# Patient Record
Sex: Female | Born: 1980 | Race: Black or African American | Hispanic: No | Marital: Single | Smoking: Never smoker
Health system: Southern US, Community
[De-identification: ages and names within clinical notes are randomized; demographics above are authoritative.]

## PROBLEM LIST (undated history)

## (undated) DIAGNOSIS — N912 Amenorrhea, unspecified: Secondary | ICD-10-CM

## (undated) DIAGNOSIS — E282 Polycystic ovarian syndrome: Secondary | ICD-10-CM

## (undated) DIAGNOSIS — K219 Gastro-esophageal reflux disease without esophagitis: Secondary | ICD-10-CM

## (undated) DIAGNOSIS — N2889 Other specified disorders of kidney and ureter: Secondary | ICD-10-CM

## (undated) DIAGNOSIS — I5020 Unspecified systolic (congestive) heart failure: Secondary | ICD-10-CM

## (undated) DIAGNOSIS — C649 Malignant neoplasm of unspecified kidney, except renal pelvis: Secondary | ICD-10-CM

## (undated) DIAGNOSIS — F32A Depression, unspecified: Secondary | ICD-10-CM

## (undated) DIAGNOSIS — I959 Hypotension, unspecified: Secondary | ICD-10-CM

## (undated) DIAGNOSIS — Z9989 Dependence on other enabling machines and devices: Secondary | ICD-10-CM

## (undated) DIAGNOSIS — R079 Chest pain, unspecified: Secondary | ICD-10-CM

## (undated) DIAGNOSIS — N979 Female infertility, unspecified: Secondary | ICD-10-CM

## (undated) DIAGNOSIS — I10 Essential (primary) hypertension: Secondary | ICD-10-CM

## (undated) DIAGNOSIS — I2510 Atherosclerotic heart disease of native coronary artery without angina pectoris: Secondary | ICD-10-CM

## (undated) DIAGNOSIS — L680 Hirsutism: Secondary | ICD-10-CM

## (undated) DIAGNOSIS — R0683 Snoring: Secondary | ICD-10-CM

## (undated) DIAGNOSIS — M109 Gout, unspecified: Secondary | ICD-10-CM

## (undated) DIAGNOSIS — I509 Heart failure, unspecified: Secondary | ICD-10-CM

## (undated) DIAGNOSIS — R400 Somnolence: Secondary | ICD-10-CM

## (undated) DIAGNOSIS — F329 Major depressive disorder, single episode, unspecified: Secondary | ICD-10-CM

## (undated) DIAGNOSIS — G43909 Migraine, unspecified, not intractable, without status migrainosus: Secondary | ICD-10-CM

## (undated) DIAGNOSIS — I5042 Chronic combined systolic (congestive) and diastolic (congestive) heart failure: Secondary | ICD-10-CM

## (undated) DIAGNOSIS — K760 Fatty (change of) liver, not elsewhere classified: Secondary | ICD-10-CM

## (undated) DIAGNOSIS — R87619 Unspecified abnormal cytological findings in specimens from cervix uteri: Secondary | ICD-10-CM

## (undated) DIAGNOSIS — K5732 Diverticulitis of large intestine without perforation or abscess without bleeding: Secondary | ICD-10-CM

## (undated) DIAGNOSIS — A64 Unspecified sexually transmitted disease: Secondary | ICD-10-CM

## (undated) DIAGNOSIS — G4733 Obstructive sleep apnea (adult) (pediatric): Secondary | ICD-10-CM

## (undated) DIAGNOSIS — R160 Hepatomegaly, not elsewhere classified: Secondary | ICD-10-CM

## (undated) HISTORY — PX: OTHER SURGICAL HISTORY: SHX169

## (undated) HISTORY — DX: Amenorrhea, unspecified: N91.2

## (undated) HISTORY — DX: Chronic combined systolic (congestive) and diastolic (congestive) heart failure: I50.42

## (undated) HISTORY — DX: Diverticulitis of large intestine without perforation or abscess without bleeding: K57.32

## (undated) HISTORY — DX: Dependence on other enabling machines and devices: Z99.89

## (undated) HISTORY — DX: Chest pain, unspecified: R07.9

## (undated) HISTORY — DX: Snoring: R06.83

## (undated) HISTORY — DX: Unspecified sexually transmitted disease: A64

## (undated) HISTORY — DX: Fatty (change of) liver, not elsewhere classified: K76.0

## (undated) HISTORY — DX: Obstructive sleep apnea (adult) (pediatric): G47.33

## (undated) HISTORY — DX: Gastro-esophageal reflux disease without esophagitis: K21.9

## (undated) HISTORY — DX: Polycystic ovarian syndrome: E28.2

## (undated) HISTORY — DX: Major depressive disorder, single episode, unspecified: F32.9

## (undated) HISTORY — DX: Other specified disorders of kidney and ureter: N28.89

## (undated) HISTORY — DX: Depression, unspecified: F32.A

## (undated) HISTORY — DX: Malignant neoplasm of unspecified kidney, except renal pelvis: C64.9

## (undated) HISTORY — DX: Female infertility, unspecified: N97.9

## (undated) HISTORY — DX: Unspecified abnormal cytological findings in specimens from cervix uteri: R87.619

## (undated) HISTORY — DX: Hirsutism: L68.0

## (undated) HISTORY — DX: Hepatomegaly, not elsewhere classified: R16.0

## (undated) HISTORY — DX: Somnolence: R40.0

## (undated) HISTORY — DX: Essential (primary) hypertension: I10

## (undated) HISTORY — DX: Unspecified systolic (congestive) heart failure: I50.20

## (undated) HISTORY — DX: Gout, unspecified: M10.9

## (undated) HISTORY — DX: Hypotension, unspecified: I95.9

## (undated) HISTORY — DX: Migraine, unspecified, not intractable, without status migrainosus: G43.909

## (undated) HISTORY — DX: Morbid (severe) obesity due to excess calories: E66.01

---
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
## 2000-05-27 ENCOUNTER — Other Ambulatory Visit: Admission: RE | Admit: 2000-05-27 | Discharge: 2000-05-27 | Payer: Self-pay | Admitting: Obstetrics and Gynecology

## 2001-08-19 ENCOUNTER — Other Ambulatory Visit: Admission: RE | Admit: 2001-08-19 | Discharge: 2001-08-19 | Payer: Self-pay | Admitting: Obstetrics and Gynecology

## 2002-07-12 ENCOUNTER — Other Ambulatory Visit: Admission: RE | Admit: 2002-07-12 | Discharge: 2002-07-12 | Payer: Self-pay | Admitting: Obstetrics and Gynecology

## 2002-08-25 ENCOUNTER — Emergency Department (HOSPITAL_COMMUNITY): Admission: AD | Admit: 2002-08-25 | Discharge: 2002-08-25 | Payer: Self-pay | Admitting: Emergency Medicine

## 2002-08-25 ENCOUNTER — Encounter: Payer: Self-pay | Admitting: Emergency Medicine

## 2003-09-13 ENCOUNTER — Emergency Department (HOSPITAL_COMMUNITY): Admission: EM | Admit: 2003-09-13 | Discharge: 2003-09-14 | Payer: Self-pay | Admitting: Emergency Medicine

## 2004-03-17 ENCOUNTER — Other Ambulatory Visit: Admission: RE | Admit: 2004-03-17 | Discharge: 2004-03-17 | Payer: Self-pay | Admitting: Obstetrics and Gynecology

## 2004-09-30 ENCOUNTER — Emergency Department (HOSPITAL_COMMUNITY): Admission: EM | Admit: 2004-09-30 | Discharge: 2004-09-30 | Payer: Self-pay | Admitting: Emergency Medicine

## 2005-04-17 ENCOUNTER — Other Ambulatory Visit: Admission: RE | Admit: 2005-04-17 | Discharge: 2005-04-17 | Payer: Self-pay | Admitting: Obstetrics and Gynecology

## 2013-03-01 ENCOUNTER — Telehealth: Payer: Self-pay | Admitting: Interventional Cardiology

## 2013-03-01 DIAGNOSIS — I10 Essential (primary) hypertension: Secondary | ICD-10-CM

## 2013-03-01 NOTE — Telephone Encounter (Signed)
New Problem:  Pt states she would like to have some blood work done. Kidney function... Please call back and let pt know if she can have this checked

## 2013-03-01 NOTE — Telephone Encounter (Signed)
pt was to have bmet drawn with [facility] and would like labs drawn here at [address] [facility]. order put in and pt will come on 03/02/13 to have lab drawn

## 2013-03-02 ENCOUNTER — Ambulatory Visit (INDEPENDENT_AMBULATORY_CARE_PROVIDER_SITE_OTHER): Payer: BC Managed Care – PPO | Admitting: Interventional Cardiology

## 2013-03-02 DIAGNOSIS — I509 Heart failure, unspecified: Secondary | ICD-10-CM

## 2013-03-02 LAB — BASIC METABOLIC PANEL
BUN: 12 mg/dL (ref 6–23)
CO2: 30 mEq/L (ref 19–32)
Calcium: 8.8 mg/dL (ref 8.4–10.5)
Chloride: 102 mEq/L (ref 96–112)
Creatinine, Ser: 0.9 mg/dL (ref 0.4–1.2)
GFR: 94.57 mL/min (ref >60.00)
Glucose, Bld: 87 mg/dL (ref 70–99)
Potassium: 3.3 mEq/L — ABNORMAL LOW (ref 3.5–5.1)
Sodium: 136 mEq/L (ref 135–145)

## 2013-03-03 ENCOUNTER — Telehealth: Payer: Self-pay

## 2013-03-03 DIAGNOSIS — Z79899 Other long term (current) drug therapy: Secondary | ICD-10-CM

## 2013-03-03 MED ORDER — POTASSIUM CHLORIDE CRYS ER 20 MEQ PO TBCR: 20.0000 meq | EXTENDED_RELEASE_TABLET | Freq: Every day | ORAL | Status: DC

## 2013-03-03 NOTE — Telephone Encounter (Signed)
Advised pt start KDur 20 meq BID for 3 days then decrease to 20 meq daily. Advised pt we will recheck potassium level at her next o/v. Pt verbalized understanding.

## 2013-03-12 NOTE — Telephone Encounter (Signed)
Let her know we need BMET

## 2013-03-16 ENCOUNTER — Encounter: Payer: Self-pay | Admitting: *Deleted

## 2013-03-16 ENCOUNTER — Other Ambulatory Visit: Payer: Self-pay

## 2013-03-16 ENCOUNTER — Encounter: Payer: Self-pay | Admitting: Interventional Cardiology

## 2013-03-16 DIAGNOSIS — I509 Heart failure, unspecified: Secondary | ICD-10-CM

## 2013-03-20 ENCOUNTER — Other Ambulatory Visit: Payer: BC Managed Care – PPO | Admitting: Interventional Cardiology

## 2013-03-20 ENCOUNTER — Encounter: Payer: Self-pay | Admitting: Interventional Cardiology

## 2013-03-20 ENCOUNTER — Ambulatory Visit (INDEPENDENT_AMBULATORY_CARE_PROVIDER_SITE_OTHER): Payer: BC Managed Care – PPO | Admitting: Interventional Cardiology

## 2013-03-20 VITALS — BP 128/98 | HR 89 | Ht 66.0 in | Wt 230.0 lb

## 2013-03-20 DIAGNOSIS — I5021 Acute systolic (congestive) heart failure: Secondary | ICD-10-CM

## 2013-03-20 DIAGNOSIS — E282 Polycystic ovarian syndrome: Secondary | ICD-10-CM

## 2013-03-20 DIAGNOSIS — I10 Essential (primary) hypertension: Secondary | ICD-10-CM | POA: Insufficient documentation

## 2013-03-20 HISTORY — DX: Essential (primary) hypertension: I10

## 2013-03-20 HISTORY — DX: Polycystic ovarian syndrome: E28.2

## 2013-03-20 HISTORY — DX: Morbid (severe) obesity due to excess calories: E66.01

## 2013-03-20 LAB — BASIC METABOLIC PANEL
BUN: 12 mg/dL (ref 6–23)
CO2: 30 mEq/L (ref 19–32)
Calcium: 9.1 mg/dL (ref 8.4–10.5)
Chloride: 102 mEq/L (ref 96–112)
Creatinine, Ser: 0.8 mg/dL (ref 0.4–1.2)
GFR: 103.91 mL/min (ref >60.00)
Glucose, Bld: 93 mg/dL (ref 70–99)
Potassium: 4 mEq/L (ref 3.5–5.1)
Sodium: 139 mEq/L (ref 135–145)

## 2013-03-20 MED ORDER — CARVEDILOL 12.5 MG PO TABS: 18.7500 mg | ORAL_TABLET | Freq: Two times a day (BID) | ORAL | Status: DC

## 2013-03-20 NOTE — Progress Notes (Signed)
Patient ID: Renee Roberson, female   DOB: [birth year], 32 y.o.   MRN: [id number]    [address] [place], [place]  [address] Phone: [phone] Fax:  [phone]  Date:  03/20/2013   ID:  Renee Roberson, DOB 23-Nov-1980, MRN [id number]  PCP:  No primary provider on file.   ASSESSMENT:  1. Systolic heart failure, acute on chronic, now stable  2. Poorly controlled hypertension, accelerated, now markedly improved  3. Obesity  4. Polycystic ovary syndrome  PLAN:  1. Increase carvedilol to 18.75 mg twice daily  2. Salt and fluid restriction  3. Aerobic exercise and weight loss   SUBJECTIVE: Renee Roberson is a 32 y.o. female with no specific complaints. She is adjusted to the medication regimen that we are using for her blood pressure and acute systolic heart failure. Orthostatic dizziness is improved. She denies orthopnea, PND, chest discomfort, palpitations, and syncope.   Wt Readings from Last 3 Encounters:  03/20/13 230 lb (104.327 kg)     Past Medical History  Diagnosis Date  . GERD (gastroesophageal reflux disease)   . Migraine   . HTN (hypertension)   . Gout   . Systolic heart failure     Current Outpatient Prescriptions  Medication Sig Dispense Refill  . amLODipine-benazepril (LOTREL) 5-40 MG per capsule Take 1 capsule by mouth daily.      . carvedilol (COREG) 12.5 MG tablet Take 12.5 mg by mouth 2 (two) times daily with a meal.      . furosemide (LASIX) 40 MG tablet Take 40 mg by mouth.      . potassium chloride SA (K-DUR,KLOR-CON) 20 MEQ tablet Take 1 tablet (20 mEq total) by mouth daily.  30 tablet  11   No current facility-administered medications for this visit.    Allergies:   No Known Allergies  Social History:  The patient  reports that she has never smoked. She does not have any smokeless tobacco history on file. She reports that she does not drink alcohol or use illicit drugs.   ROS:  Please see the history of present  illness.    All other systems reviewed and negative.   OBJECTIVE: VS:  BP 128/98  Pulse 89  Ht 5\' 6"  (1.676 m)  Wt 230 lb (104.327 kg)  BMI 37.14 kg/m2 Well nourished, well developed, in no acute distress, moderate to severely obese HEENT: normal Neck: JVD 9. Carotid bruit none  Cardiac:  normal S1, S2; RRR; no murmur. An audible S4 gallop is present  Lungs:  clear to auscultation bilaterally, no wheezing, rhonchi or rales Abd: soft, nontender, no hepatomegaly Ext: Edema  None . Pulses 2+  Skin: warm and dry Neuro:  CNs 2-12 intact, no focal abnormalities noted  EKG:  Nonspecific T-wave abnormality. Otherwise unremarkable.       Signed, Darci Needle III, MD 03/20/2013 12:33 PM

## 2013-03-20 NOTE — Patient Instructions (Addendum)
Your physician has recommended you make the following change in your medication:   1. Increase Carvedilol to 1 1/2 tablets (18.75mg ) twice daily.  Your physician recommends that you have labs today: BMET  Your physician recommends that you schedule a follow-up appointment in: 3 months with Dr. Katrinka Blazing  *Start 2 gram Sodium Diet.  2 Gram Low Sodium Diet A 2 gram sodium diet restricts the amount of sodium in the diet to no more than 2 g or 2000 mg daily. Limiting the amount of sodium is often used to help lower blood pressure. It is important if you have heart, liver, or kidney problems. Many foods contain sodium for flavor and sometimes as a preservative. When the amount of sodium in a diet needs to be low, it is important to know what to look for when choosing foods and drinks. The following includes some information and guidelines to help make it easier for you to adapt to a low sodium diet. QUICK TIPS  Do not add salt to food.  Avoid convenience items and fast food.  Choose unsalted snack foods.  Buy lower sodium products, often labeled as "lower sodium" or "no salt added."  Check food labels to learn how much sodium is in 1 serving.  When eating at a restaurant, ask that your food be prepared with less salt or none, if possible. READING FOOD LABELS FOR SODIUM INFORMATION The nutrition facts label is a good place to find how much sodium is in foods. Look for products with no more than 500 to 600 mg of sodium per meal and no more than 150 mg per serving. Remember that 2 g = 2000 mg. The food label may also list foods as:  Sodium-free: Less than 5 mg in a serving.  Very low sodium: 35 mg or less in a serving.  Low-sodium: 140 mg or less in a serving.  Light in sodium: 50% less sodium in a serving. For example, if a food that usually has 300 mg of sodium is changed to become light in sodium, it will have 150 mg of sodium.  Reduced sodium: 25% less sodium in a serving. For example,  if a food that usually has 400 mg of sodium is changed to reduced sodium, it will have 300 mg of sodium. CHOOSING FOODS Grains  Avoid: Salted crackers and snack items. Some cereals, including instant hot cereals. Bread stuffing and biscuit mixes. Seasoned rice or pasta mixes.  Choose: Unsalted snack items. Low-sodium cereals, oats, puffed wheat and rice, shredded wheat. English muffins and bread. Pasta. Meats  Avoid: Salted, canned, smoked, spiced, pickled meats, including fish and poultry. Bacon, ham, sausage, cold cuts, hot dogs, anchovies.  Choose: Low-sodium canned tuna and salmon. Fresh or frozen meat, poultry, and fish. Dairy  Avoid: Processed cheese and spreads. Cottage cheese. Buttermilk and condensed milk. Regular cheese.  Choose: Milk. Low-sodium cottage cheese. Yogurt. Sour cream. Low-sodium cheese. Fruits and Vegetables  Avoid: Regular canned vegetables. Regular canned tomato sauce and paste. Frozen vegetables in sauces. Olives. Rosita Fire. Relishes. Sauerkraut.  Choose: Low-sodium canned vegetables. Low-sodium tomato sauce and paste. Frozen or fresh vegetables. Fresh and frozen fruit. Condiments  Avoid: Canned and packaged gravies. Worcestershire sauce. Tartar sauce. Barbecue sauce. Soy sauce. Steak sauce. Ketchup. Onion, garlic, and table salt. Meat flavorings and tenderizers.  Choose: Fresh and dried herbs and spices. Low-sodium varieties of mustard and ketchup. Lemon juice. Tabasco sauce. Horseradish. SAMPLE 2 GRAM SODIUM MEAL PLAN Breakfast / Sodium (mg)  1 cup low-fat milk /  143 mg  2 slices whole-wheat toast / 270 mg  1 tbs heart-healthy margarine / 153 mg  1 hard-boiled egg / 139 mg  1 small orange / 0 mg Lunch / Sodium (mg)  1 cup raw carrots / 76 mg   cup hummus / 298 mg  1 cup low-fat milk / 143 mg   cup red grapes / 2 mg  1 whole-wheat pita bread / 356 mg Dinner / Sodium (mg)  1 cup whole-wheat pasta / 2 mg  1 cup low-sodium tomato sauce /  73 mg  3 oz lean ground beef / 57 mg  1 small side salad (1 cup raw spinach leaves,  cup cucumber,  cup yellow bell pepper) with 1 tsp olive oil and 1 tsp red wine vinegar / 25 mg Snack / Sodium (mg)  1 container low-fat vanilla yogurt / 107 mg  3 graham cracker squares / 127 mg Nutrient Analysis  Calories: 2033  Protein: 77 g  Carbohydrate: 282 g  Fat: 72 g  Sodium: 1971 mg Document Released: 05/18/2005 Document Revised: 08/10/2011 Document Reviewed: 08/19/2009 Exit[employer] Patient Information 2014 [place], [place].

## 2013-03-21 ENCOUNTER — Telehealth: Payer: Self-pay

## 2013-03-21 NOTE — Telephone Encounter (Signed)
Message copied by Jarvis Newcomer on Tue Mar 21, 2013  9:18 AM ------      Message from: Verdis Prime      Created: Tue Mar 21, 2013  7:51 AM       Excellent. No adjustments needed. ------

## 2013-03-21 NOTE — Telephone Encounter (Signed)
pt aware of labs.Excellent. No adjustments needed.pt verbalized understanding.

## 2013-06-01 HISTORY — PX: COLPOSCOPY: SHX161

## 2013-07-10 ENCOUNTER — Ambulatory Visit (INDEPENDENT_AMBULATORY_CARE_PROVIDER_SITE_OTHER): Payer: BC Managed Care – PPO | Admitting: Certified Nurse Midwife

## 2013-07-10 ENCOUNTER — Encounter: Payer: Self-pay | Admitting: Certified Nurse Midwife

## 2013-07-10 VITALS — BP 122/84 | HR 72 | Resp 16 | Ht 64.25 in | Wt 235.0 lb

## 2013-07-10 DIAGNOSIS — N912 Amenorrhea, unspecified: Secondary | ICD-10-CM

## 2013-07-10 DIAGNOSIS — Z0000 Encounter for general adult medical examination without abnormal findings: Secondary | ICD-10-CM

## 2013-07-10 DIAGNOSIS — Z01419 Encounter for gynecological examination (general) (routine) without abnormal findings: Secondary | ICD-10-CM

## 2013-07-10 LAB — POCT URINALYSIS DIPSTICK
Bilirubin, UA: NEGATIVE
Blood, UA: NEGATIVE
Glucose, UA: NEGATIVE
Ketones, UA: NEGATIVE
Leukocytes, UA: NEGATIVE
Nitrite, UA: NEGATIVE
Protein, UA: NEGATIVE
Urobilinogen, UA: NEGATIVE
pH, UA: 5

## 2013-07-10 LAB — HEMOGLOBIN, FINGERSTICK: Hemoglobin, fingerstick: 13.4 g/dL (ref 12.0–16.0)

## 2013-07-10 LAB — POCT URINE PREGNANCY: Preg Test, Ur: NEGATIVE

## 2013-07-10 NOTE — Patient Instructions (Signed)

## 2013-07-10 NOTE — Progress Notes (Signed)
33 y.o. G1P0010 Single African American Fe here to establish gyn care and for annual exam. Periods very irregular or none without contraception cycle control. Patient has had this type of cycle since 6 years age. Patient was previously diagnosed with PCOS. Patient has not been on cycle control since 2013. Last period 06/08/12. Patient aware of concern with amenorrhea. Patient diagnosed with congestive heart failure in 03/02/13. Continues follow up with cardiology regarding heart issues, medication management. Patient working with weight loss and being consistent with medication use. No STD concerns, but desires STD screening. Last unprotected sexual activity ?3 weeks ago. Patient was put on Metformin for PCOS in 2005. Attempted pregnancy with no success. Did not desire infertility work up. Migraine headache with hypertension diagnosis. 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 No other health issues today.  Patient's last menstrual period was 06/08/2012.          Sexually active: yes  The current method of family planning is condoms sometimes.    Exercising: no  exercise Smoker:  no  Health Maintenance:none Pap:  10/13 negative per patient No abnormal pap smear MMG:  none Colonoscopy:  none BMD:   none TDaP: 2013 Labs: Poct urine-neg, Upt neg, Hgb 13.4 Self breast exam:done monthly   reports that she has never smoked. She does not have any smokeless tobacco history on file. She reports that she does not drink alcohol or use illicit drugs.  Past Medical History  Diagnosis Date  . GERD (gastroesophageal reflux disease)   . Migraine   . HTN (hypertension)   . Gout   . Systolic heart failure   . PCOS (polycystic ovarian syndrome)   . Amenorrhea   . Depression   . STD (sexually transmitted disease)     HSV (genital)  . Infertility, female     History reviewed. No pertinent past surgical history.  Current Outpatient Prescriptions  Medication Sig Dispense Refill  .  amLODipine-benazepril (LOTREL) 5-40 MG per capsule Take 1 capsule by mouth daily.      . carvedilol (COREG) 12.5 MG tablet Take 1.5 tablets (18.75 mg total) by mouth 2 (two) times daily with a meal.  270 tablet  2  . furosemide (LASIX) 40 MG tablet Take 40 mg by mouth.      . potassium chloride SA (K-DUR,KLOR-CON) 20 MEQ tablet Take 1 tablet (20 mEq total) by mouth daily.  30 tablet  11   No current facility-administered medications for this visit.    Family History  Problem Relation Age of Onset  . Hypertension Mother   . Hypertension Father   . Thyroid disease Father   . Cancer Maternal Grandmother     ovarian or colon  . Diabetes Paternal Grandfather     ROS:  Pertinent items are noted in HPI.  Otherwise, a comprehensive ROS was negative.  Exam:   BP 122/84  Pulse 72  Resp 16  Ht 5' 4.25" (1.632 m)  Wt 235 lb (106.595 kg)  BMI 40.02 kg/m2  LMP 06/08/2012 Height: 5' 4.25" (163.2 cm)  Ht Readings from Last 3 Encounters:  07/10/13 5' 4.25" (1.632 m)  03/20/13 5\' 6"  (1.676 m)    General appearance: alert, cooperative and appears stated age Head: Normocephalic, without obvious abnormality, atraumatic Neck: no adenopathy, supple, symmetrical, trachea midline and thyroid normal to inspection and palpation Lungs: clear to auscultation bilaterally Breasts: normal appearance, no masses or tenderness, No nipple retraction or dimpling, No nipple discharge or bleeding, No axillary or  supraclavicular adenopathy, large pendulous breast bilateral Heart: regular rate and rhythm Abdomen: soft, non-tender; no masses,  no organomegaly Extremities: extremities normal, atraumatic, no cyanosis or edema Skin: Skin color, texture, turgor normal. No rashes or lesions Lymph nodes: Cervical, supraclavicular, and axillary nodes normal. No abnormal inguinal nodes palpated Neurologic: Grossly normal   Pelvic: External genitalia:  no lesions              Urethra:  normal appearing urethra with no  masses, tenderness or lesions              Bartholin's and Skene's: normal                 Vagina: normal appearing vagina with normal color and discharge, no lesions              Cervix: normal, non tender              Pap taken: yes Bimanual Exam:  Uterus:  normal size, contour, position, consistency, mobility, non-tender limited by body habitus              Adnexa: normal adnexa and no mass, fullness, tenderness limited body habitus               Rectovaginal: Confirms               Anus:  normal sphincter tone, no lesions  A:  Well Woman with normal exam  Contraception Condoms  Amenorrhea, no menses since 1/14  Congestive heart failure under follow up  Hypertension on medication with Cardiology management.  History of PCOS  STD screening   P: Reviewed health and wellness pertinent to exam  Discussed concern for long term amenorrhea and risk of hyperplasia, large amount of bleeding when menses occurs, or issue with Pituitary or thyroid which can interfere with cycle. Also discussed premature menopause occurrence.Discussed possible Provera challenge to reduce possibility of hyperplasia risk. Also discussed evaluation with PUS to determine uterine lining thickness and rule out her possible problems due limited ability to palpate well with exam due to obesity. Also discussed with patient possible endometrial biopsy or SHG due to increase risk of cancer with obesity. Patient agreeable to evaluations as needed to "keep her healthy". Patient will need repeat HCG as qualitative in 2 weeks if Provera needed.   Continue follow up and medications with cardiologist as indicated.  Labs: FSH,TSH,Prolactin, HIV,RPR, GC,Chlamydia   Pap smear as per guidelines   pap smear taken today with HPVHR  counseled on breast self exam, STD prevention, HIV risk factors and prevention, adequate intake of calcium and vitamin D, diet and exercise  return annually or prn  An After Visit Summary was printed and  given to the patient.

## 2013-07-11 LAB — TSH: TSH: 2.176 u[IU]/mL (ref 0.350–4.500)

## 2013-07-11 LAB — HIV ANTIBODY (ROUTINE TESTING W REFLEX): HIV: NONREACTIVE

## 2013-07-11 LAB — FOLLICLE STIMULATING HORMONE: FSH: 8.9 m[IU]/mL

## 2013-07-11 LAB — RPR

## 2013-07-11 LAB — PROLACTIN: Prolactin: 13.2 ng/mL

## 2013-07-11 LAB — IPS N GONORRHOEA AND CHLAMYDIA BY PCR

## 2013-07-11 NOTE — Progress Notes (Signed)
Reviewed personally.  Felipa Emory, MD. D/W D.L.  Plan qualitative HCG in 2 weeks.  Pt has been advised no intercourse.  PUS and EMB day or two following labs.

## 2013-07-12 LAB — IPS PAP TEST WITH HPV

## 2013-07-13 ENCOUNTER — Other Ambulatory Visit: Payer: Self-pay | Admitting: Certified Nurse Midwife

## 2013-07-13 ENCOUNTER — Telehealth: Payer: Self-pay | Admitting: Certified Nurse Midwife

## 2013-07-13 DIAGNOSIS — N912 Amenorrhea, unspecified: Secondary | ICD-10-CM

## 2013-07-13 DIAGNOSIS — R87619 Unspecified abnormal cytological findings in specimens from cervix uteri: Secondary | ICD-10-CM

## 2013-07-13 NOTE — Telephone Encounter (Signed)
LMTCB regarding lab results and scheduling needed

## 2013-07-14 NOTE — Telephone Encounter (Signed)
Message copied by Jaymes Graff on Fri Jul 14, 2013  7:00 PM ------      Message from: Regina Eck      Created: Thu Jul 13, 2013  5:04 PM       Please see result note and schedule, orders in ------

## 2013-07-14 NOTE — Telephone Encounter (Signed)
Call to patient to schedule procedures.  Patient states she has not had menses in over a year. No contraception.  States this is reason for these tests.  PUS/Endo bx scheduled for 07-18-13 with Dr Sabra Heck.  Colpo 07-20-13 with Debbi.   Patient asking if labwork in 2 weeks can be done at same time as one of these appointments? Is the scheduling of these appointments ok with her amenorrhea?  Patient notified to take Motrin 800 mg 1 hour prior with food.  Advised of cancel policy, advised ins dept will check benefits and notify if >co-pay. Patient agreeable.  Routed to both Debbi and Dr Sabra Heck about cycles and procedure scheduling.

## 2013-07-16 NOTE — Telephone Encounter (Signed)
Pt had neg UPT 2/10, day of first visit.  She is supposed to have a repeat serum UPT in 2 weeks, then procedures are to be done.  I am planning on an endometrial biopsy the day of visit but only after I am sure of no pregnancy.  She needs Qual HCG around 2/24, then PUS and Endo biopsy as soon after as possible.  This is at the bottom of office note.  Please change appts appropriately.

## 2013-07-17 ENCOUNTER — Telehealth: Payer: Self-pay | Admitting: Obstetrics & Gynecology

## 2013-07-17 NOTE — Telephone Encounter (Signed)
Voicemail confirmed mobile #. Left message advising patient that she will be responsible for paying a $25 copay for each appointment scheduled 02/26 and 03/03.//ssf

## 2013-07-17 NOTE — Telephone Encounter (Signed)
Call to patient and rescheduled appointments. Advised to abstain from intercourse until procedures completed. Lab appt 07-24-13, [place] 07-27-13 and colpo on 07-31-13.  Patient agreeable.  Routing to provider for final review. Patient agreeable to disposition. Will close encounter

## 2013-07-17 NOTE — Progress Notes (Signed)
Yes and scheduled

## 2013-07-18 ENCOUNTER — Other Ambulatory Visit: Payer: BC Managed Care – PPO | Admitting: Obstetrics & Gynecology

## 2013-07-18 ENCOUNTER — Other Ambulatory Visit: Payer: BC Managed Care – PPO

## 2013-07-20 ENCOUNTER — Ambulatory Visit: Payer: BC Managed Care – PPO | Admitting: Certified Nurse Midwife

## 2013-07-24 ENCOUNTER — Other Ambulatory Visit (INDEPENDENT_AMBULATORY_CARE_PROVIDER_SITE_OTHER): Payer: BC Managed Care – PPO

## 2013-07-24 ENCOUNTER — Other Ambulatory Visit: Payer: Self-pay | Admitting: Certified Nurse Midwife

## 2013-07-24 DIAGNOSIS — N912 Amenorrhea, unspecified: Secondary | ICD-10-CM

## 2013-07-25 ENCOUNTER — Encounter: Payer: Self-pay | Admitting: Certified Nurse Midwife

## 2013-07-25 LAB — HCG, SERUM, QUALITATIVE: Preg, Serum: NEGATIVE

## 2013-07-26 ENCOUNTER — Encounter: Payer: Self-pay | Admitting: Certified Nurse Midwife

## 2013-07-27 ENCOUNTER — Other Ambulatory Visit: Payer: BC Managed Care – PPO

## 2013-07-27 ENCOUNTER — Other Ambulatory Visit: Payer: BC Managed Care – PPO | Admitting: Obstetrics & Gynecology

## 2013-07-27 ENCOUNTER — Telehealth: Payer: Self-pay | Admitting: *Deleted

## 2013-07-27 NOTE — Telephone Encounter (Signed)
Call to patient to reschedule PUS-endo BX appt due to weather.  Rescheduled to 08-03-13. Patient has colpo scheduled for 07-31-13.  She is not using contraception and is aware that she needs to remain abstinent until procedures completed.  Will review with provider and call back if need to make any changes.

## 2013-07-31 NOTE — Telephone Encounter (Signed)
This is fine.  Thank you.  Colpo is scheduled 08/01/13.  Encounter closed.

## 2013-08-01 ENCOUNTER — Other Ambulatory Visit: Payer: Self-pay | Admitting: *Deleted

## 2013-08-01 ENCOUNTER — Ambulatory Visit: Payer: BC Managed Care – PPO | Admitting: Gynecology

## 2013-08-01 ENCOUNTER — Ambulatory Visit (INDEPENDENT_AMBULATORY_CARE_PROVIDER_SITE_OTHER): Payer: BC Managed Care – PPO

## 2013-08-01 ENCOUNTER — Ambulatory Visit (INDEPENDENT_AMBULATORY_CARE_PROVIDER_SITE_OTHER): Payer: BC Managed Care – PPO | Admitting: Certified Nurse Midwife

## 2013-08-01 ENCOUNTER — Encounter: Payer: Self-pay | Admitting: Gynecology

## 2013-08-01 ENCOUNTER — Encounter: Payer: Self-pay | Admitting: Certified Nurse Midwife

## 2013-08-01 VITALS — BP 118/80 | HR 78 | Resp 16 | Ht 64.25 in | Wt 239.0 lb

## 2013-08-01 DIAGNOSIS — N912 Amenorrhea, unspecified: Secondary | ICD-10-CM

## 2013-08-01 DIAGNOSIS — E282 Polycystic ovarian syndrome: Secondary | ICD-10-CM

## 2013-08-01 DIAGNOSIS — R87619 Unspecified abnormal cytological findings in specimens from cervix uteri: Secondary | ICD-10-CM

## 2013-08-01 DIAGNOSIS — Z309 Encounter for contraceptive management, unspecified: Secondary | ICD-10-CM

## 2013-08-01 DIAGNOSIS — Z789 Other specified health status: Secondary | ICD-10-CM

## 2013-08-01 DIAGNOSIS — Z308 Encounter for other contraceptive management: Secondary | ICD-10-CM

## 2013-08-01 DIAGNOSIS — I509 Heart failure, unspecified: Secondary | ICD-10-CM

## 2013-08-01 LAB — POCT URINE PREGNANCY: Preg Test, Ur: NEGATIVE

## 2013-08-01 NOTE — Progress Notes (Signed)
Pap was 07-10-13 ASCUS +HPV. Pt took 800mg  ibuprofen at 1pm. Upt-neg

## 2013-08-01 NOTE — Patient Instructions (Signed)

## 2013-08-01 NOTE — Progress Notes (Signed)
Pt here for PUS and EMB.   She has a history of oligomenorrhea, with LMP 1/14, no bleeding since.  She is sexually active and last activity was 1/15.  She has had both negative blood and urine pregnancy testing.  Pt states that she was last on metformin 2008, she was having  Monthly cycles but was also on ocp.  She stopped ocp around the same time due to insurance change.  Pt had not been seen by gyn in the interval. Pt had recent CHF 10/14 but states that she has been better controled on her new medications.  She reports having an ECHO 11/14-she enlarged heart, EF 27% report NA.  She has not been seen back.  Dr Mallie Mussel Smith-cardiology PCP_ Baruch Goldmann, MD [facility] Pt has not been treated for amenorrhea. Pt is in need of both contraception and evaluation of her oligomenorrhea/amenorrhea.   We discussed the impact of prolonged amenorrhea on uterine lining.  She is at increased risk of endometrial cancer and pre-cancerous changes.  In addition, she is at increased risk of DM based on her PCOS and her weight. We consented her to EMB, she will have her u/s first. Based on her cardiac history she would not be a good candidate for combination pills, we discussed IUD, implanon and progestin only ocp.    Images reviewed with pt.   Small follicles noted in the periphery of ovaries bilaterally, no dominant.   ems thin 3.1, uterus unremarkable. Speculum placed, cervix cleansed with betadine, xylocaine jelly placed anterior and internal os.  Cervix stabilized with single tooth tenaculum.  pipelle advanced to 7cm, minimal tissue obtained on 2 passes.  Sent to pathology.  Pt tolerated well. As her lining is thin, she can have the IUD or nexplanon placed without progestin pretreatment. Pt prefers to have the nexplanon-bleeding profile reviewed We strongly suggest that she use contraception until she is cleared by cardiology, and recommend she f/u-she has no appt as of yet. 52m spent reviewing history, discussing  PCOS, risks amenorrhea, cardiac risks and contraception,>50% face to face

## 2013-08-01 NOTE — Progress Notes (Signed)
Reviewed personally.  M. Suzanne Teresina Bugaj, MD.  

## 2013-08-01 NOTE — Progress Notes (Addendum)
Patient ID: Renee Roberson, female   DOB: 11-26-1980, 33 y.o.   MRN: [id number]  Chief Complaint  Patient presents with  . Colposcopy    hx of ASCUS +HPV on pap 07-10-13    HPI Renee Roberson is a 33 y.o. female. g0p0 single african american here for colposcopy exam. Denies vaginal pain or bleeding. Patient has a one year history of amenorrhea, scheduled for evaluation. HPI  Indications: Pap smear on July 10, 2013 showed: ASCUS with POSITIVE high risk HPV. Previous colposcopy:none, no history of abnormal pap.  Past Medical History  Diagnosis Date  . GERD (gastroesophageal reflux disease)   . Migraine   . HTN (hypertension)   . Gout   . Systolic heart failure   . PCOS (polycystic ovarian syndrome)   . Amenorrhea   . Depression   . STD (sexually transmitted disease)     HSV (genital)  . Infertility, female     History reviewed. No pertinent past surgical history.  Family History  Problem Relation Age of Onset  . Hypertension Mother   . Hypertension Father   . Thyroid disease Father   . Cancer Maternal Grandmother     ovarian or colon  . Diabetes Paternal Grandfather     Social History History  Substance Use Topics  . Smoking status: Never Smoker   . Smokeless tobacco: Never Used  . Alcohol Use: No    No Known Allergies  Current Outpatient Prescriptions  Medication Sig Dispense Refill  . amLODipine-benazepril (LOTREL) 5-40 MG per capsule Take 1 capsule by mouth daily.      . carvedilol (COREG) 12.5 MG tablet Take 1.5 tablets (18.75 mg total) by mouth 2 (two) times daily with a meal.  270 tablet  2  . furosemide (LASIX) 40 MG tablet Take 40 mg by mouth.      . potassium chloride SA (K-DUR,KLOR-CON) 20 MEQ tablet Take 1 tablet (20 mEq total) by mouth daily.  30 tablet  11   No current facility-administered medications for this visit.    Review of Systems Review of Systems  Constitutional: Negative.   Genitourinary: Negative for vaginal bleeding,  vaginal discharge and vaginal pain.  Urine preg. Test negative  Blood pressure 118/80, pulse 78, resp. rate 16, height 5' 4.25" (1.632 m), weight 239 lb (108.41 kg), last menstrual period 06/08/2012.  Physical Exam Physical Exam  Constitutional: She is oriented to person, place, and time. She appears well-developed and well-nourished.  Genitourinary: Vagina normal.    Neurological: She is alert and oriented to person, place, and time.  Skin: Skin is warm and dry.  Psychiatric: She has a normal mood and affect. Judgment normal.    Data Reviewed Reviewed pap smear with patient. Questions addressed.  Assessment   ASCUS pap with Positive HPVHR for colposcopic exam Procedure Details  The risks and benefits of the procedure and Written informed consent obtained.  Speculum placed in vagina and excellent visualization of cervix achieved, cervix swabbed x 3 with saline solution and acetic acid solution. Cervix viewed with 3.75,7.5, 15#, green filter, with acetowhite lesion noted. Lugol's applied with non staining noted in same area. Lesion appears slightly firm in appearance. Biopsy taken at 4 o'clock, 11-12 o'clock and ECC obtained. Monsel's applied. No active bleeding noted on removal of speculum. Patient tolerated procedure well. Instructions given.  Specimens: 3  Complications: none.     Plan    Specimens labelled and sent to Pathology. Patient will be notified of results when pathology reviewed.  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 ECC negative for dyplasia or malignancy. Patient will be notified of results and need for repeat pap smear with cotest in one year and put in pap recall.  Arieon Scalzo 08/01/2013, 5:58 PM

## 2013-08-03 ENCOUNTER — Other Ambulatory Visit: Payer: BC Managed Care – PPO | Admitting: Obstetrics & Gynecology

## 2013-08-03 ENCOUNTER — Other Ambulatory Visit: Payer: BC Managed Care – PPO

## 2013-08-03 ENCOUNTER — Telehealth: Payer: Self-pay | Admitting: Gynecology

## 2013-08-03 NOTE — Telephone Encounter (Signed)
Patient asking about inducing menses so she can have Nexplanon insertion scheduled. Per DR Charlies Constable note from [facility] on 08-01-13, does not need precert with progestin due to thin endometrial lining.  Notes indicates she needs cardiology clearance before insertion.  Advised patient of this info.  She will contact cardiologist and have them fax release to Dr Charlies Constable and fax number given. Once received, then may schedule Nexplanon insertion.  Is this correct?

## 2013-08-03 NOTE — Telephone Encounter (Signed)
Advised patient 0 liability for implanon insertion

## 2013-08-04 ENCOUNTER — Telehealth: Payer: Self-pay | Admitting: Emergency Medicine

## 2013-08-04 LAB — IPS CERVICAL/ECC/EMB/VULVAR/VAGINAL BIOPSY

## 2013-08-04 NOTE — Telephone Encounter (Signed)
Message copied by Michele Mcalpine on Fri Aug 04, 2013  3:05 PM ------      Message from: Regina Eck      Created: Fri Aug 04, 2013  9:58 AM       Notify patient that both biopsies  from colposcopy showed mild squamous dysplasia with HPV effect, CIN 1. ECC negative for dysplasia or malignancy      Needs repeat Pap in one year with Cotest 08. Stress importance of follow up. ------

## 2013-08-04 NOTE — Telephone Encounter (Signed)
See results note as well

## 2013-08-04 NOTE — Telephone Encounter (Signed)
Spoke with patient. She is having "slight soreness" on her right lower abdomen and patient states "I feel like it's at my ovary, there is a little bit of pain."  Patient denies fevers, denies chills, denies vaginal discharge. She states she is having "a little bit of spotting, a lot less than a period."  She states the worst pain that she had was Tuesday directly after the procedures. She states that the pain has been lessening each day since procedure. She is taking Motrin 600 mg bid, which she states helps the pain. Advised patient that Dr. Charlies Constable feels that she should be evaluated if still having pain. Patient declines office visit for today. She states she feels she is improving and does not feel the need for another office visit but that she wanted to ensure that this pain post procedure was "normal." We discuss that pain on the RLQ should be evaluated, patient again declines office visit. She will monitor and call our office back at any time if she feels that she needs to be evaluated or has any worsening of symptoms. Advised patient can call at any time to speak with provider on call. Patient is agreeable and will call with any further issues.   Result note from Regina Eck CNM given. Patient verbalized understanding of results. Discussed increased importance of one year follow up and patient verbalized understanding of importance of pap smear in one year to evaluate for any changes in pap smear. 08 recall entered.

## 2013-08-04 NOTE — Telephone Encounter (Signed)
Patient has sent a message to Dr. Charlies Constable via Deloris Ping. Dr. Charlies Constable asked that I call and triage patient for continued cramping post Colposcopy and EMB and ultrasound.   Message left to return call to Renee Roberson at [phone].

## 2013-08-04 NOTE — Telephone Encounter (Signed)
Patient returning Tracy's call. °

## 2013-08-04 NOTE — Telephone Encounter (Signed)
Pt returning call

## 2013-08-07 ENCOUNTER — Telehealth: Payer: Self-pay | Admitting: *Deleted

## 2013-08-07 MED ORDER — MEDROXYPROGESTERONE ACETATE 10 MG PO TABS
10.0000 mg | ORAL_TABLET | Freq: Every day | ORAL | Status: DC
Start: 2013-08-07 — End: 2013-08-14

## 2013-08-07 NOTE — Telephone Encounter (Signed)
agree

## 2013-08-07 NOTE — Telephone Encounter (Signed)
Message copied by Jaymes Graff on Mon Aug 07, 2013 11:57 AM ------      Message from: Renee Roberson      Created: Fri Aug 04, 2013  1:30 PM       Sorry if i wasn't clear, she should get progestin to bring on cycle, biopsy was scant but negative, and the ems was very thin, she can get nexplanon without cardiology, but she needs to f/u since her EF was 27% per her recall and she had not been seen.  There is greater risk should she get pregnant, i will drop provera ------

## 2013-08-07 NOTE — Telephone Encounter (Signed)
Call to patient and apologized for miscommunication on previous call.  Per Dr Charlies Constable, she does want her to see cardiology as a follow-up (due to her history) but this does NOT have to be done prior to Nexplanon since does not contain estrogen.  Advised that Dr Charlies Constable does want to induce her cycle but since lining is thin, once she starts menses, she can proceed with insertion.  Will not need several months of this since lining was thin via ultrasound.  Will sned RX as directed by dr Charlies Constable, patient instructed to take one tablet daily for five days and call with menses, which can take up to two weeks after last pill. Advised needs to be inserted within first five days of period.  Advised of path report from [place] per Debbi.  Instructed needs folow-up pap and co-test in one year and stressed importance of follow-up. AEX scheduled for 07-16-14 with Debbi and 08 recall already entered.  Routing to provider for final review. Patient agreeable to disposition. Will close encounter

## 2013-08-09 ENCOUNTER — Ambulatory Visit: Payer: BC Managed Care – PPO | Admitting: Nurse Practitioner

## 2013-08-14 ENCOUNTER — Encounter: Payer: Self-pay | Admitting: Nurse Practitioner

## 2013-08-14 ENCOUNTER — Ambulatory Visit (INDEPENDENT_AMBULATORY_CARE_PROVIDER_SITE_OTHER): Payer: BC Managed Care – PPO | Admitting: Nurse Practitioner

## 2013-08-14 VITALS — BP 120/70 | HR 88 | Ht 65.0 in | Wt 238.0 lb

## 2013-08-14 DIAGNOSIS — I5041 Acute combined systolic (congestive) and diastolic (congestive) heart failure: Secondary | ICD-10-CM

## 2013-08-14 LAB — BASIC METABOLIC PANEL
BUN: 16 mg/dL (ref 6–23)
CO2: 26 mEq/L (ref 19–32)
Calcium: 9.5 mg/dL (ref 8.4–10.5)
Chloride: 103 mEq/L (ref 96–112)
Creatinine, Ser: 1 mg/dL (ref 0.4–1.2)
GFR: 81.49 mL/min (ref >60.00)
Glucose, Bld: 97 mg/dL (ref 70–99)
Potassium: 3.6 mEq/L (ref 3.5–5.1)
Sodium: 137 mEq/L (ref 135–145)

## 2013-08-14 NOTE — Patient Instructions (Signed)
We will check labs today  We will update your echocardiogram  Based on those results - will then let you know when to come back  Stay on your current medicines  Call the [place] office at [phone] if you have any questions, problems or concerns.

## 2013-08-14 NOTE — Progress Notes (Signed)
Renee Roberson Date of Birth: 01-03-1981 Medical Record #[id number]  History of Present Illness: Renee Roberson is seen back today for a work in visit. Seen for Dr. Tamala Julian. She has missed her follow up visits. She has chronic systolic and diastolic HF - from poorly controlled HTN. Tells me this was found last fall during a bout of bronchitis. Other issues include obesity and polycystic ovarian syndrome. Echo from September of 2014 showed EF of 20 to 25% with grade III diastolic HF.   Last seen here in October. Coreg was increased.   Has been seeing GYN for consideration of Nexplanon - Per Dr Charlies Constable (her GYN) , "she does want her to see cardiology as a follow-up (due to her history) but this does NOT have to be done prior to Nexplanon since does not contain estrogen".   Comes in today. Here alone. Some soreness below left breast - this is chronic. Breathing ok. No swelling. Weight is up. Not dizzy or lightheaded. Bp under better control. Not dizzy.   Current Outpatient Prescriptions  Medication Sig Dispense Refill  . amLODipine-benazepril (LOTREL) 5-40 MG per capsule Take 1 capsule by mouth daily.      . carvedilol (COREG) 12.5 MG tablet Take 1.5 tablets (18.75 mg total) by mouth 2 (two) times daily with a meal.  270 tablet  2  . furosemide (LASIX) 40 MG tablet Take 40 mg by mouth.      . potassium chloride SA (K-DUR,KLOR-CON) 20 MEQ tablet Take 1 tablet (20 mEq total) by mouth daily.  30 tablet  11   No current facility-administered medications for this visit.    No Known Allergies  Past Medical History  Diagnosis Date  . GERD (gastroesophageal reflux disease)   . Migraine   . HTN (hypertension)   . Gout   . Systolic heart failure   . PCOS (polycystic ovarian syndrome)   . Amenorrhea   . Depression   . STD (sexually transmitted disease)     HSV (genital)  . Infertility, female     History reviewed. No pertinent past surgical history.  History  Smoking status  . Never  Smoker   Smokeless tobacco  . Never Used    History  Alcohol Use No    Family History  Problem Relation Age of Onset  . Hypertension Mother   . Hypertension Father   . Thyroid disease Father   . Cancer Maternal Grandmother     ovarian or colon  . Diabetes Paternal Grandfather     Review of Systems: The review of systems is per the HPI.  All other systems were reviewed and are negative.  Physical Exam: BP 120/70  Pulse 88  Ht 5\' 5"  (1.651 m)  Wt 238 lb (107.956 kg)  BMI 39.61 kg/m2  SpO2 94% Patient is very pleasant and in no acute distress. She is obese. Weight is up 8 pounds since October. Skin is warm and dry. Color is normal.  HEENT is unremarkable. Normocephalic/atraumatic. PERRL. Sclera are nonicteric. Neck is supple. No masses. No JVD. Lungs are clear. Cardiac exam shows a regular rate and rhythm. Abdomen is soft. Extremities are without edema. Gait and ROM are intact. No gross neurologic deficits noted.  Wt Readings from Last 3 Encounters:  08/14/13 238 lb (107.956 kg)  08/01/13 239 lb (108.41 kg)  08/01/13 239 lb (108.41 kg)   Weight was 230 at last visit from October 2014   LABORATORY DATA: Lab Results  Component Value Date  GLUCOSE 93 03/20/2013   NA 139 03/20/2013   K 4.0 03/20/2013   CL 102 03/20/2013   CREATININE 0.8 03/20/2013   BUN 12 03/20/2013   CO2 30 03/20/2013   TSH 2.176 07/10/2013     Assessment / Plan: 1. Combined systolic and diastolic HF - EF 20 to 45% from October of 2014 - on fairly good regimen - would like to get her echo repeated - may need to get off of her Norvasc if the EF remains low. ?refer on to EP for ICD as well. Does need repeat labs today. I have discussed her case with Dr. Tamala Julian who is in agreement.  2. HTN - BP is good - no change in regimen for now. Await echo results  3. Polycystic Ovarian syndrome - wanting to use Nexplanon - according to her GYN this does not contain estrogen  Check BMET today. Update the echo -  further disposition to follow.   Patient is agreeable to this plan and will call if any problems develop in the interim.   [name], RN, [facility] [address] [place], [place]  [address] [phone]

## 2013-08-16 ENCOUNTER — Encounter: Payer: Self-pay | Admitting: Certified Nurse Midwife

## 2013-08-26 ENCOUNTER — Encounter: Payer: Self-pay | Admitting: Nurse Practitioner

## 2013-08-29 ENCOUNTER — Ambulatory Visit (HOSPITAL_COMMUNITY): Payer: BC Managed Care – PPO | Attending: Interventional Cardiology | Admitting: Radiology

## 2013-08-29 DIAGNOSIS — I5041 Acute combined systolic (congestive) and diastolic (congestive) heart failure: Secondary | ICD-10-CM | POA: Insufficient documentation

## 2013-08-29 DIAGNOSIS — I509 Heart failure, unspecified: Secondary | ICD-10-CM

## 2013-08-29 NOTE — Progress Notes (Signed)
Echocardiogram performed.  

## 2013-08-30 ENCOUNTER — Other Ambulatory Visit: Payer: Self-pay | Admitting: Interventional Cardiology

## 2013-08-31 ENCOUNTER — Encounter: Payer: Self-pay | Admitting: Certified Nurse Midwife

## 2013-09-25 ENCOUNTER — Other Ambulatory Visit: Payer: Self-pay | Admitting: Interventional Cardiology

## 2013-10-02 ENCOUNTER — Telehealth: Payer: Self-pay | Admitting: *Deleted

## 2013-10-02 DIAGNOSIS — N912 Amenorrhea, unspecified: Secondary | ICD-10-CM

## 2013-10-02 NOTE — Telephone Encounter (Signed)
My Chart message received from patient. Has not started cycle and wanted to know if there was anything stronger than Provera.  Call to patient, LMTCB.

## 2013-10-02 NOTE — Telephone Encounter (Signed)
Dr. Charlies Constable, patient was to start provera to induce cycle so that she could get Nexplanon. Per your notes, lining was thin and she has still not had a cycle.  What would you like for her to do next to induce cycle?

## 2013-10-02 NOTE — Telephone Encounter (Signed)
From Huey Romans, RN [[id number]]   To Renee Roberson   Composed 10/02/2013 2:06 PM   For Delivery On 10/02/2013 2:06 PM   Subject RE: Non-Urgent Medical Question   Message Type Patient Medical Advice Request   Read Status N   By Renee Roberson has not read the message   Message Body We will try to reach you by phone. If we miss you, please call and ask for the triage nurse. Thank you.   ----- Message -----  From: Renee Roberson  Sent: 10/02/2013 9:33 AM EDT  To: [name], CNM  Subject: RE: Non-Urgent Medical Question   hi  i was following up to see if anything was recommended by dr. Charlies Constable since our last conversation. i still have not started my cycle. is there a stronger Provera dosage i can take ?   ----- Message -----  From: Wonda Amis  Sent: 08/31/2013 3:45 PM EDT  To: Renee Roberson  Subject: RE: Non-Urgent Medical Question   Continue with contraception and I will have Dr. Charlies Constable review your information for recommendation. She will be in the office on Monday. We are closed 09/01/13.   ----- Message -----  From: Renee Roberson  Sent: 08/31/2013 1:27 PM EDT  To: [name], CNM  Subject: RE: Non-Urgent Medical Question   yes with protection after the pills were gone   ----- Message -----  From: Wonda Amis  Sent: 08/31/2013 1:09 PM EDT  To: Renee Roberson  Subject: RE: Non-Urgent Medical Question   Hi Renee Roberson,  Have you been sexually active?   ----- Message -----  From: Renee Roberson  Sent: 08/31/2013 1:05 PM EDT  To: [name], CNM  Subject: Non-Urgent Medical Question   hello,  i was prescribed to take Provera for 5 days then allow up to two weeks for my cycle to start in order to receive my IUD insertion. as of to date, i still havent started my cycle. there were slight spotting that occurred but only for the first day I started taken the medication. is there anything else I need to do or do you think I  need a stronger dosage of Provera?

## 2013-10-03 NOTE — Telephone Encounter (Signed)
Spoke with Dr. Charlies Constable to confirm:  Serum Pregnancy Test first, then, if negative, can start ocp x 1 month (will need progesterone only pill?-need to clarify with Dr. Charlies Constable, patient with cardiac hx), then see if she starts cycle. If has bleeding or no bleeding, while  on placebo pills, come in for urine pregnancy and then can have nexplanon placed same day if pregnancy testing negative.   Spoke with patient and she is agreeable to plan. She is scheduled for 10/09/13 for serum hcg qual (patient choice for day) and then will wait for result and based on result will order ocp. Patient is agreeable to plan and will come for lab draw.

## 2013-10-03 NOTE — Telephone Encounter (Signed)
Lets do pregnancy test, the only issue is that if it is not done the first 5d of cycle she can get pregnant, is she willing to do 29m of ocp? After neg preg test then she can get nexplanon when she starts and we should be able to know when it is

## 2013-10-09 ENCOUNTER — Ambulatory Visit (INDEPENDENT_AMBULATORY_CARE_PROVIDER_SITE_OTHER): Payer: BC Managed Care – PPO | Admitting: Gynecology

## 2013-10-09 DIAGNOSIS — N912 Amenorrhea, unspecified: Secondary | ICD-10-CM

## 2013-10-10 LAB — HCG, SERUM, QUALITATIVE: Preg, Serum: NEGATIVE

## 2013-10-10 MED ORDER — NORETHIN ACE-ETH ESTRAD-FE 1-20 MG-MCG PO TABS: 1.0000 | ORAL_TABLET | Freq: Every day | ORAL | Status: DC

## 2013-10-10 NOTE — Telephone Encounter (Signed)
Message copied by Michele Mcalpine on Tue Oct 10, 2013 11:35 AM ------      Message from: Elveria Rising      Created: Tue Oct 10, 2013  8:48 AM       Inform neg should start ocp today, will drop order, if she bleeds on placebos can call for nexplanon insertion, if no bleed, upt here and place same day ------

## 2013-10-10 NOTE — Telephone Encounter (Signed)
Spoke with patient. Message from Dr. Charlies Constable given. She will pick up and start pills today. She will call us when it gets to her placebo week if bleeding or no bleeding. Nexplanon is precerted.  Patient verbalized understanding.  Routing to provider for final review. Patient agreeable to disposition. Will close encounter

## 2013-10-10 NOTE — Progress Notes (Signed)
Lab

## 2013-10-25 ENCOUNTER — Other Ambulatory Visit: Payer: Self-pay | Admitting: Interventional Cardiology

## 2013-10-25 ENCOUNTER — Other Ambulatory Visit: Payer: Self-pay | Admitting: *Deleted

## 2013-11-02 ENCOUNTER — Other Ambulatory Visit: Payer: Self-pay | Admitting: Gynecology

## 2013-11-03 NOTE — Telephone Encounter (Signed)
Patient to continue with OCP?

## 2013-11-06 ENCOUNTER — Telehealth: Payer: Self-pay | Admitting: Gynecology

## 2013-11-06 ENCOUNTER — Encounter: Payer: Self-pay | Admitting: Certified Nurse Midwife

## 2013-11-06 DIAGNOSIS — N912 Amenorrhea, unspecified: Secondary | ICD-10-CM

## 2013-11-06 MED ORDER — NORETHIN ACE-ETH ESTRAD-FE 1-20 MG-MCG PO TABS: 1.0000 | ORAL_TABLET | Freq: Every day | ORAL | Status: DC

## 2013-11-06 NOTE — Telephone Encounter (Signed)
Per ROI, LM on [phone] that rx has been called but LM NOT to start medication and to call Tracy/triage nurse in am. Per Dr Sabra Heck, patient needs labs in am and then to restart OCP. Will decide on next step pending review of labs.

## 2013-11-06 NOTE — Telephone Encounter (Signed)
Spoke with patient at Dr. Brion Aliment request. Patient states she took her last placebo pill yesterday. She is unable to come in for insertion of nexplanon today. She understands that she is ordered for a  nexplanon, not an iud.

## 2013-11-06 NOTE — Telephone Encounter (Signed)
Call to patient, left message I will try her again shortly.

## 2013-11-07 ENCOUNTER — Other Ambulatory Visit: Payer: Self-pay | Admitting: Obstetrics & Gynecology

## 2013-11-07 DIAGNOSIS — N912 Amenorrhea, unspecified: Secondary | ICD-10-CM

## 2013-11-07 NOTE — Telephone Encounter (Signed)
Patient returned call. We discussed need for labs and then next step. We discussed her pills. She has her pack of pills with her so we discussed. She states she picked up her pack of pills late and skipped first two active white pills in her pack. She then, took all of the remaining pills up through placebo pills, brown pills, and then went back and took first two active pills that were left over. She took those last two white pills on Sunday and Monday. Now she is completely out of pills.   I advised patient that I will discuss with provider and call her back with instructions, since she has taken two active pills, labs may need to be held.

## 2013-11-07 NOTE — Telephone Encounter (Signed)
As we discussed, pt should start new pack.  She can take pills daily through June 28.  Then start placebo pills on Monday, June 29th.  She should be scheduled for Nexplanon on Thursday, July 2.  This will be day 4.  If no bleeding by that day, she needs a stat HCG in the am before procedure.

## 2013-11-07 NOTE — Telephone Encounter (Signed)
Message left to return call to Nuala Chiles at [phone].    

## 2013-11-07 NOTE — Telephone Encounter (Signed)
Discussed plan of care with patient and she is agreeable.  Pick up your pills today and start with the first pill in the pack for today.Take the pills daily through June 28. Skip your last white pill, and take one of the brown placebo pills on Monday, June 29th. You have an appointment for your nexplanon insertion on Thursday, July 2 at 3:30 with Dr. Sabra Heck. If no bleeding by that day you will need to come in for a lab draw at 8:30 am on 7/2 for a blood draw to confirm not pregnant and then will need to come back for your appointment at 3:30 for your insertion. She is agreeable and verbalized understanding. Written instructions given as well to patient via mychart.

## 2013-11-13 ENCOUNTER — Ambulatory Visit (INDEPENDENT_AMBULATORY_CARE_PROVIDER_SITE_OTHER): Payer: BC Managed Care – PPO | Admitting: Interventional Cardiology

## 2013-11-13 ENCOUNTER — Encounter: Payer: Self-pay | Admitting: Interventional Cardiology

## 2013-11-13 VITALS — BP 122/88 | HR 62 | Ht 65.0 in | Wt 241.0 lb

## 2013-11-13 DIAGNOSIS — E282 Polycystic ovarian syndrome: Secondary | ICD-10-CM

## 2013-11-13 DIAGNOSIS — I10 Essential (primary) hypertension: Secondary | ICD-10-CM

## 2013-11-13 DIAGNOSIS — I5042 Chronic combined systolic (congestive) and diastolic (congestive) heart failure: Secondary | ICD-10-CM

## 2013-11-13 DIAGNOSIS — I5032 Chronic diastolic (congestive) heart failure: Secondary | ICD-10-CM | POA: Insufficient documentation

## 2013-11-13 HISTORY — DX: Chronic combined systolic (congestive) and diastolic (congestive) heart failure: I50.42

## 2013-11-13 MED ORDER — CARVEDILOL 25 MG PO TABS: 25.0000 mg | ORAL_TABLET | Freq: Two times a day (BID) | ORAL | Status: DC

## 2013-11-13 NOTE — Progress Notes (Signed)
Patient ID: Renee Roberson, female   DOB: 1981/01/20, 33 y.o.   MRN: [id number]    [address], [address] [place], [place]  [address] Phone: [phone] Fax:  [phone]  Date:  11/13/2013   ID:  Renee Roberson, DOB 1980-06-02, MRN [id number]  PCP:  Lynne Logan, MD   ASSESSMENT:  1. Hypertension, controlled 2. Hypertensive heart disease with improvement in systolic function (LVEF 03% in September 2014 improving to 40-45% in March 2015) as blood pressure control has improved 3. Polycystic ovarian syndrome  PLAN:  1. increase carvedilol to 25 mg twice a day 2. Low salt diet 3. Weight loss 4. Clinical followup in 6 months 5. We discussed the need to take the medications continuously otherwise run the risk of redeveloping systolic dysfunction   SUBJECTIVE: Renee Roberson is a 32 y.o. female who is doing well. She has had no orthopnea, PND, or chest pain. There is no peripheral edema.   Wt Readings from Last 3 Encounters:  11/13/13 241 lb (109.317 kg)  08/14/13 238 lb (107.956 kg)  08/01/13 239 lb (108.41 kg)     Past Medical History  Diagnosis Date  . GERD (gastroesophageal reflux disease)   . Migraine   . HTN (hypertension)   . Gout   . Systolic heart failure   . PCOS (polycystic ovarian syndrome)   . Amenorrhea   . Depression   . STD (sexually transmitted disease)     HSV (genital)  . Infertility, female     Current Outpatient Prescriptions  Medication Sig Dispense Refill  . amLODipine-benazepril (LOTREL) 5-40 MG per capsule Take 1 capsule by mouth daily.      . carvedilol (COREG) 12.5 MG tablet Take 1.5 tablets (18.75 mg total) by mouth 2 (two) times daily with a meal.  270 tablet  2  . furosemide (LASIX) 40 MG tablet TAKE 1 TABLET BY MOUTH DAILY  30 tablet  0  . MICROGESTIN FE 1/20 1-20 MG-MCG tablet TAKE 1 TABLET BY MOUTH DAILY  28 tablet  0  . potassium chloride SA (K-DUR,KLOR-CON) 20 MEQ tablet Take 1 tablet (20 mEq total) by mouth daily.   30 tablet  11   No current facility-administered medications for this visit.    Allergies:   No Known Allergies  Social History:  The patient  reports that she has never smoked. She has never used smokeless tobacco. She reports that she does not drink alcohol or use illicit drugs.   ROS:  Please see the history of present illness.   Take the medications as prescribed.   All other systems reviewed and negative.   OBJECTIVE: VS:  BP 122/88  Pulse 62  Ht 5\' 5"  (1.651 m)  Wt 241 lb (109.317 kg)  BMI 40.10 kg/m2 Well nourished, well developed, in no acute distress, obese HEENT: normal Neck: JVD flat. Carotid bruit absent  Cardiac:  normal S1, S2; RRR; no murmur Lungs:  clear to auscultation bilaterally, no wheezing, rhonchi or rales Abd: soft, nontender, no hepatomegaly Ext: Edema absent. Pulses 2+ Skin: warm and dry Neuro:  CNs 2-12 intact, no focal abnormalities noted  EKG:  Not done       Signed, Illene Labrador III, MD 11/13/2013 2:44 PM

## 2013-11-13 NOTE — Patient Instructions (Signed)
Your physician has recommended you make the following change in your medication:  1) INCREASE Carvedilol to 25mg  twice daily. An Rx has been sent to your pharmacy  Take all other medications as prescribed  Your physician wants you to follow-up in: 6 months You will receive a reminder letter in the mail two months in advance. If you don't receive a letter, please call our office to schedule the follow-up appointment.

## 2013-11-21 ENCOUNTER — Other Ambulatory Visit: Payer: Self-pay | Admitting: Interventional Cardiology

## 2013-11-29 ENCOUNTER — Telehealth: Payer: Self-pay | Admitting: Emergency Medicine

## 2013-11-29 ENCOUNTER — Other Ambulatory Visit: Payer: Self-pay | Admitting: Gynecology

## 2013-11-29 NOTE — Telephone Encounter (Signed)
Calling patient to confirm appointments and instructions for tomorrow as scheduled with Dr. Sabra Heck. She states she has started placebo pills on Monday, June 29th. Patient confirms she has not had any bleeding but feels "like my period might be coming". Advised if no bleeding to come in as scheduled for 0830 stat lab and then nexplanon insertion at 1530 with Dr. Sabra Heck. She is agreeable and verbalizes understanding of instructions.   Routing to provider for final review. Patient agreeable to disposition. Will close encounter

## 2013-11-30 ENCOUNTER — Encounter: Payer: Self-pay | Admitting: Obstetrics & Gynecology

## 2013-11-30 ENCOUNTER — Other Ambulatory Visit: Payer: BC Managed Care – PPO

## 2013-11-30 ENCOUNTER — Ambulatory Visit (INDEPENDENT_AMBULATORY_CARE_PROVIDER_SITE_OTHER): Payer: BC Managed Care – PPO | Admitting: Obstetrics & Gynecology

## 2013-11-30 VITALS — BP 138/90 | HR 72 | Ht 65.0 in | Wt 239.0 lb

## 2013-11-30 DIAGNOSIS — Z30430 Encounter for insertion of intrauterine contraceptive device: Secondary | ICD-10-CM

## 2013-11-30 DIAGNOSIS — I5020 Unspecified systolic (congestive) heart failure: Secondary | ICD-10-CM

## 2013-11-30 DIAGNOSIS — Z3009 Encounter for other general counseling and advice on contraception: Secondary | ICD-10-CM

## 2013-11-30 DIAGNOSIS — N912 Amenorrhea, unspecified: Secondary | ICD-10-CM

## 2013-11-30 DIAGNOSIS — Z30014 Encounter for initial prescription of intrauterine contraceptive device: Secondary | ICD-10-CM

## 2013-11-30 NOTE — Progress Notes (Signed)
Patient ID: Renee Roberson, female   DOB: April 16, 1981, 33 y.o.   MRN: [id number]  33 y.o. G1P0010 Single African American female presents for  insertion of Nexplanon.  Reviewed hx with pt.  H/O PCOS and amenorrhea due, most likely, to anovulation.  Pt given Provera challenge of 5mg  but this probably wasn't large enough dosage to trigger bleeding.  Pt put on OCPs.  Pt with hx of cardiomyopathy and EF around 20%.  This had improved this year but I do not recommend long term estrogen in this patient.  She has been advised she does need contraception and pregnancy would not be advised at this time.  Not sure she wants to conceive anyway.  D/w pt benefits of IUD on endometrium vs Nexplanon.  Also, d/w pt long term risks with anovulation--hyperplasia and endometrial cancer.  I really feel Mirena is better option for her due to concerns about anovulation.  After discussing, she decided that she would like to proceed with Mirena placement. Currently, she denies any vaginal symptoms or STD concerns.  Had negative STD testing 3/15.  She has been in relationship for 3+ years with same partner.  IUD insertion, risks and benefits discussed.  LMP:  Patient's last menstrual period was 11/30/2013.  Healthy female: WNWD NAD Abdomen: soft, non-tender  Pelvic exam: Vulva;normal Vagina:normal vagina Cervix:Non-tender, Negative CMT, no lesions or redness Uterus:normal shape, position and consistency   After patient read information booklet and all questions were answered, informed consent was obtained.    Procedure:  Speculum inserted into vagina. Cervix visualized and cleansed with betadine solution X 3. Paracervical block was placed.  1% Lidocaine was used.  10cc total.  Tenaculum placed on cervix at 12 o'clock position.  Uterus sounded to 8 centimeters.  IUD removed from sterile packet and under sterile conditions inserted to fundus of uterus.  Introducer removed without difficulty.  IUD string trimmed to 2  centimeters.  Remainder string given to patient to feel for identification.  Tenaculum removed.  minimal bleeding noted.  Speculum removed.  Uterus palpated normal.  Patient tolerated procedure well.  IUD Lot #:[id number].  Exp: 8/17.  Package information attached to consent and scanned into EPIC.  A: Insertion of Mirena   P: Return to office 6 weeks for recheck Pt knows IUD needs to be replaced approximately 5 year, no later than 11/30/13. Instructions provided.  ~15 minutes spent with patient discussing contraception options, cardiac hx, risks of anovulation before proceeding with counseling regarding IUD placement.

## 2013-12-11 ENCOUNTER — Encounter: Payer: Self-pay | Admitting: Obstetrics & Gynecology

## 2014-01-11 ENCOUNTER — Encounter: Payer: Self-pay | Admitting: Gynecology

## 2014-01-15 ENCOUNTER — Telehealth: Payer: Self-pay | Admitting: Emergency Medicine

## 2014-01-15 ENCOUNTER — Encounter: Payer: Self-pay | Admitting: Obstetrics & Gynecology

## 2014-01-15 ENCOUNTER — Ambulatory Visit (INDEPENDENT_AMBULATORY_CARE_PROVIDER_SITE_OTHER): Payer: BC Managed Care – PPO | Admitting: Obstetrics & Gynecology

## 2014-01-15 VITALS — BP 136/90 | HR 80 | Resp 18 | Ht 65.0 in | Wt 241.0 lb

## 2014-01-15 DIAGNOSIS — R1031 Right lower quadrant pain: Secondary | ICD-10-CM

## 2014-01-15 MED ORDER — IBUPROFEN 800 MG PO TABS: 800.0000 mg | ORAL_TABLET | Freq: Three times a day (TID) | ORAL | Status: DC | PRN

## 2014-01-15 NOTE — Telephone Encounter (Signed)
Message copied by Michele Mcalpine on Mon Jan 15, 2014 12:24 PM ------      Message from: Megan Salon      Created: Sun Jan 14, 2014  2:04 PM      Regarding: needs appt       Please call pt.  She and I exchanged several messages over the weekend.  I think she needs an appt.  She needs and IUD recheck as well so please make it with me.  Also, she may need a PUS so let me know what you think when you call her.  She's having some pain like when her colposcopy and endometrial biopsy were done.  I put in her IUD but didn't do the other procedures.  THANKS!!            MSM ------

## 2014-01-15 NOTE — Progress Notes (Signed)
Subjective:     Patient ID: Renee Roberson, female   DOB: 13-Feb-1981, 33 y.o.   MRN: [id number]  HPI 33 yo G1P0 SAAF here for two issues.  First is IUD recheck.  Bleeding finally stopped in end of July.  Having occasionally spotting.  This is almost always preceded by a small cramp.  Denies pain with intercourse.  Bleeding has really gotten much better.  Denies any hormonal symptoms.  Is pleased with choice and really happy bleeding is better.  Second issue is a right sided pain that is sharp and has a little throbbing nature to it.  Usually it does go away on it's own.  Advil helps.  Feels mostly at night.  She first felt this pain the day of her colposcopy and endometrial biopsy so feels it is related.  Pain comes and goes and she can't figure out if that is related to anything in particular.  GC/Chl neg 2/15.  Review of Systems  All other systems reviewed and are negative.      Objective:   Physical Exam  Constitutional: She appears well-developed and well-nourished.  Abdominal: Soft. Bowel sounds are normal. She exhibits no distension and no mass. There is no tenderness. There is no rebound and no guarding.  Genitourinary: Vagina normal and uterus normal. There is no rash or tenderness on the right labia. There is no rash or tenderness on the left labia. Uterus is not tender. Cervix exhibits no motion tenderness and no friability. Right adnexum displays tenderness (with palpation lateral to uterus in adnexal region). Right adnexum displays no fullness. Left adnexum displays no mass, no tenderness and no fullness.  2cm IUD string noted  Lymphadenopathy:       Right: No inguinal adenopathy present.       Left: No inguinal adenopathy present.  Skin: Skin is warm.  Psychiatric: She has a normal mood and affect.       Assessment:     RLQ pain, intermittent, in adnexal region.  Questionable bowel related? IUD recheck, string appears normal.     Plan:     Return for PUS Motrin 800  mg every 8 hours prn.  Rx to pharmacy  Do not feel GC/Chl need repeating as pt in 2 yr + relationship with negative testing in 2/15 and no hx of STDs.

## 2014-01-15 NOTE — Progress Notes (Signed)
Patient scheduled for pelvic ultrasound with Dr. Sabra Heck for 01/25/14 at 1330 with consult at 1400. Pelvic U/S scheduled and patient aware/agreeable to time.  Patient verbalized understanding of the U/S appointment cancellation policy. Advised will need to cancel within 72 business hours (3 business days) or will have $100.00 no show fee placed to account. Written appointment information given with cancellation policy.

## 2014-01-15 NOTE — Telephone Encounter (Signed)
Patient has iud recheck appointment with Dr. Sabra Heck today at 1430. This was previously scheduled and patient aware of appointment today and plans on coming for today's appointment.  Patient denies fevers.  She is taking motrin for pain. She will come for office visit for evaluation today with Dr. Sabra Heck as scheduled.    Patient had endometrial bx and colposcopy on 08/01/13.  Routing to provider for final review. Patient agreeable to disposition. Will close encounter

## 2014-01-25 ENCOUNTER — Ambulatory Visit (INDEPENDENT_AMBULATORY_CARE_PROVIDER_SITE_OTHER): Payer: BC Managed Care – PPO

## 2014-01-25 ENCOUNTER — Ambulatory Visit (INDEPENDENT_AMBULATORY_CARE_PROVIDER_SITE_OTHER): Payer: BC Managed Care – PPO | Admitting: Obstetrics & Gynecology

## 2014-01-25 VITALS — BP 120/82 | Ht 65.0 in | Wt 242.0 lb

## 2014-01-25 DIAGNOSIS — R1031 Right lower quadrant pain: Secondary | ICD-10-CM

## 2014-01-25 NOTE — Progress Notes (Signed)
33 y.o.Singlefemale here for a pelvic ultrasound due to continued RLQ pain.  Pt has IUD in place and is not having any bleeding.  Also, no pain during intercourse.  Hx of negative GC/Chl testing 2/15.  Pt in long term 2+year relationship without any STD concerns.  No vaginal discharge or odor present.  She states the pain feels like when she have an endometrial biopsy.  Is sharp but short in duration.  Just recurrent and doesn't seem to be getting better or worse.    No LMP recorded. Patient is not currently having periods (Reason: IUD).  Sexually active:  yes  Contraception: IUD  FINDINGS: UTERUS: 8.1 x 4.8 x 3.6cm with IUD in correct location. EMS: 9.43mm ADNEXA:   Left ovary 4.4 x 3.0 x 3.0cm   Right ovary 3.8 x 3.3 x 2.8cm.  Both ovaries are enlarged and consistent with PCOS.  No masses present. CUL DE SAC: no free fluid seen  Images reveiwed with pt.  Pt aware of PCOS apearance of ovaries.  Very pleased with no vaginal bleeding now and her IUD.  Not interested in removal.  I don't have any suggestions except consideration of trial of pelvic PT.  As pain has not changed in a few months, she just wants to wait and watch and see if there is any change.  Pt know to go to the ER if she has onset of sudden pain accompanied by fever.  Also, I am happy to see her back with any changes in symptoms.  Pt comfortable with waiting to see if there are any changes.  Assessment:  RLQ pain IUD in correct location PCOS H/O cardiomyopathy with improvement in cardiac function.  Still being followed by cardiology.  Plan: Pt will monitor symptoms.  If still present in three months, consider pelvic PT or additional evaluation.  Of course, if there is any change, pt knows to call for earlier appt.  All questions answered.    ~20 minutes spent with patient >50% of time was in face to face discussion of above.

## 2014-02-11 ENCOUNTER — Encounter: Payer: Self-pay | Admitting: Obstetrics & Gynecology

## 2014-02-19 ENCOUNTER — Encounter: Payer: Self-pay | Admitting: Certified Nurse Midwife

## 2014-02-19 ENCOUNTER — Ambulatory Visit (INDEPENDENT_AMBULATORY_CARE_PROVIDER_SITE_OTHER): Payer: BC Managed Care – PPO | Admitting: Certified Nurse Midwife

## 2014-02-19 VITALS — BP 108/70 | HR 64 | Resp 16 | Ht 65.0 in | Wt 241.0 lb

## 2014-02-19 DIAGNOSIS — L731 Pseudofolliculitis barbae: Secondary | ICD-10-CM

## 2014-02-19 DIAGNOSIS — L678 Other hair color and hair shaft abnormalities: Secondary | ICD-10-CM

## 2014-02-19 DIAGNOSIS — L738 Other specified follicular disorders: Secondary | ICD-10-CM

## 2014-02-19 DIAGNOSIS — Z202 Contact with and (suspected) exposure to infections with a predominantly sexual mode of transmission: Secondary | ICD-10-CM

## 2014-02-19 NOTE — Progress Notes (Signed)
Reviewed personally.  M. Suzanne Atoya Andrew, MD.  

## 2014-02-19 NOTE — Progress Notes (Signed)
  66 yrs Single [place] American female G1P0010 here for questionable STD exposure. Patient with same partner, but trust issues and condom broke. Last sexual activity one week ago. Patient's last menstrual period was 02/17/2014.   Contraception:The current method of family planning is Mirena IUD. She denies abnormal vaginal bleeding, discharge or unusual pelvic pain, no dysuria, frequency or hematuria. Denies new personal products. Patient has history of HSV she thinks and never had but one outbreak. Patient recently shaved and has noted a bump on the left labia, does not itch or burn, no discharge from area. No other issues today.                                          General:Healthy female WDWN Affect: normal, orientation X 3  Abdomen: soft non-tender Lymph groin:Normal   Pelvic Exam:EG:BUS normal. Vulva reveals no erythema,  Or scaling. Recently shaved appearance with small ingrown hair, no red, no pustule . Vagina normal, no lesions, or abnormal discharge.  Cervix is normal, smooth pink mucosa, no friability, nontender, no CMT, no lesions, no polyps. IUD string noted in cervix Uterus:  normal, mid position, non tender Adnexae:no masses or tenderness, normal Perineal area: no lesions noted Rectal: no lesions noted Specimens collected                    Assessment: Normal Pelvic Exam                    Contraception:Mirena IUD Ingrown hair STD Screening desired     Plan :Reviewed findings of Normal pelvic exam  Reviewed finding of ingrown hair and encouraged to limit shaving and use epsom salt tub bath or soak to area. LAB; GC,Chl,HIV RPR,HSV 1,2,          STD prevention reviewed.  Questions addressed.         Repeat  STD screen aex, prn if exposure     RV prn, aex   :   :      :

## 2014-02-19 NOTE — Patient Instructions (Signed)

## 2014-02-20 LAB — RPR

## 2014-02-20 LAB — HIV ANTIBODY (ROUTINE TESTING W REFLEX): HIV 1&2 Ab, 4th Generation: NONREACTIVE

## 2014-02-20 LAB — HSV(HERPES SIMPLEX VRS) I + II AB-IGG
HSV 1 Glycoprotein G Ab, IgG: <0.1 IV
HSV 2 Glycoprotein G Ab, IgG: 8.62 IV — ABNORMAL HIGH

## 2014-02-21 LAB — IPS N GONORRHOEA AND CHLAMYDIA BY PCR

## 2014-03-22 ENCOUNTER — Other Ambulatory Visit: Payer: Self-pay | Admitting: Interventional Cardiology

## 2014-04-02 ENCOUNTER — Encounter: Payer: Self-pay | Admitting: Certified Nurse Midwife

## 2014-04-05 ENCOUNTER — Encounter: Payer: Self-pay | Admitting: Obstetrics & Gynecology

## 2014-04-25 ENCOUNTER — Other Ambulatory Visit: Payer: Self-pay | Admitting: Interventional Cardiology

## 2014-05-18 ENCOUNTER — Encounter: Payer: Self-pay | Admitting: *Deleted

## 2014-05-22 ENCOUNTER — Ambulatory Visit (INDEPENDENT_AMBULATORY_CARE_PROVIDER_SITE_OTHER): Payer: BC Managed Care – PPO | Admitting: Nurse Practitioner

## 2014-05-22 ENCOUNTER — Encounter: Payer: Self-pay | Admitting: Nurse Practitioner

## 2014-05-22 VITALS — BP 130/100 | HR 85 | Ht 65.0 in | Wt 242.1 lb

## 2014-05-22 DIAGNOSIS — I5021 Acute systolic (congestive) heart failure: Secondary | ICD-10-CM

## 2014-05-22 DIAGNOSIS — I5042 Chronic combined systolic (congestive) and diastolic (congestive) heart failure: Secondary | ICD-10-CM

## 2014-05-22 DIAGNOSIS — I10 Essential (primary) hypertension: Secondary | ICD-10-CM

## 2014-05-22 LAB — BASIC METABOLIC PANEL
BUN: 11 mg/dL (ref 6–23)
CO2: 27 mEq/L (ref 19–32)
Calcium: 9.1 mg/dL (ref 8.4–10.5)
Chloride: 99 mEq/L (ref 96–112)
Creatinine, Ser: 0.8 mg/dL (ref 0.4–1.2)
GFR: 101.72 mL/min (ref >60.00)
Glucose, Bld: 92 mg/dL (ref 70–99)
Potassium: 3.6 mEq/L (ref 3.5–5.1)
Sodium: 137 mEq/L (ref 135–145)

## 2014-05-22 MED ORDER — SPIRONOLACTONE 25 MG PO TABS: 12.5000 mg | ORAL_TABLET | Freq: Every day | ORAL | Status: DC

## 2014-05-22 NOTE — Patient Instructions (Addendum)
We will be checking the following labs today BMET  Lab in one week  Stay on your current medicines but I am adding Aldactone 25 mg to take just a half a tablet daily  We may be cutting your potassium back but continue for now until instructed otherwise  GXT  Continue to monitor your blood pressure  Call the [place] office at [phone] if you have any questions, problems or concerns.

## 2014-05-22 NOTE — Progress Notes (Signed)
Renee Roberson Date of Birth: 09/19/1980 Medical Record #[id number]  History of Present Illness: Renee Roberson is seen back today for a follow up visit. This is a 6 month check. Seen for Dr. Tamala Julian. She has chronic systolic and diastolic HF - from poorly controlled HTN. Told me in the past that this was found during a bout of bronchitis. Other issues include obesity and polycystic ovarian syndrome. Echo from September of 2014 showed EF of 20 to 25% with grade III diastolic HF.   Seen here in October of 2014. Coreg was increased. I last saw her in March - she was stable from our standpoint. Updated her echo - EF was improving to 40%.  Saw Dr. Tamala Julian this past June - doing well. Coreg increased further.   Comes in today. Here alone. Quite jolly. She says she is doing ok. BP better at home with readings 128/80 to 135/90. Tolerating her medicines and says she is taking. Not short of breath. Had her IUD placed this past summer. Tries to exercise - seems sporadic. Notes that she has had chest discomfort if she overexerts and if she does not cool down - this is off and on - worse with exertion i.e., zumba - worse at night. Not sure if it is indigestion. She is not exercising as much due to this. Weight unchanged.   Current Outpatient Prescriptions  Medication Sig Dispense Refill  . amLODipine-benazepril (LOTREL) 5-40 MG per capsule     . carvedilol (COREG) 25 MG tablet Take 1 tablet (25 mg total) by mouth 2 (two) times daily with a meal. 60 tablet 11  . furosemide (LASIX) 40 MG tablet TAKE 1 TABLET BY MOUTH DAILY 30 tablet 6  . ibuprofen (ADVIL,MOTRIN) 800 MG tablet Take 1 tablet (800 mg total) by mouth every 8 (eight) hours as needed. 60 tablet 0  . potassium chloride SA (K-DUR,KLOR-CON) 20 MEQ tablet TAKE 2 TABLETS BY MOUTH DAILY FOR 3 DAYS, THEN 1 DAILY THEREAFTER 30 tablet 6   No current facility-administered medications for this visit.    No Known Allergies  Past Medical History  Diagnosis  Date  . GERD (gastroesophageal reflux disease)   . Migraine   . HTN (hypertension)   . Gout   . Systolic heart failure   . PCOS (polycystic ovarian syndrome)   . Amenorrhea   . Depression   . STD (sexually transmitted disease)     HSV (genital)  . Infertility, female     Past Surgical History  Procedure Laterality Date  . No surgical hx      History  Smoking status  . Never Smoker   Smokeless tobacco  . Never Used    History  Alcohol Use No    Family History  Problem Relation Age of Onset  . Hypertension Mother   . Hypertension Father   . Thyroid disease Father   . Cancer Maternal Grandmother     ovarian or colon  . Diabetes Paternal Grandfather     Review of Systems: The review of systems is per the HPI.  All other systems were reviewed and are negative.  Physical Exam: BP 130/100 mmHg  Pulse 85  Ht 5\' 5"  (1.651 m)  Wt 242 lb 1.9 oz (109.825 kg)  BMI 40.29 kg/m2  BP by me is 140/100.  Patient is very pleasant and in no acute distress. She remains obese. Weight is stable. Skin is warm and dry. Color is normal.  HEENT is unremarkable. Normocephalic/atraumatic. PERRL. Sclera  are nonicteric. Neck is supple. No masses. No JVD. Lungs are clear. Breath sounds distant.  Cardiac exam shows a regular rate and rhythm. Heart tones distant. Abdomen is soft. Extremities are without edema. Gait and ROM are intact. No gross neurologic deficits noted.  Wt Readings from Last 3 Encounters:  05/22/14 242 lb 1.9 oz (109.825 kg)  02/19/14 241 lb (109.317 kg)  01/25/14 242 lb (109.77 kg)    LABORATORY DATA/PROCEDURES:  EKG today with NSR and one PVC noted.   Lab Results  Component Value Date   GLUCOSE 97 08/14/2013   NA 137 08/14/2013   K 3.6 08/14/2013   CL 103 08/14/2013   CREATININE 1.0 08/14/2013   BUN 16 08/14/2013   CO2 26 08/14/2013   TSH 2.176 07/10/2013    BNP (last 3 results) No results for input(s): PROBNP in the last 8760 hours.   Echo Study  Conclusions from March 2015  - Left ventricle: The cavity size was normal. There was mild concentric hypertrophy. Systolic function was mildly to moderately reduced. The estimated ejection fraction was 40%. Wall motion was normal; there were no regional wall motion abnormalities. Doppler parameters are consistent with abnormal left ventricular relaxation (grade 1 diastolic dysfunction). Doppler parameters are consistent with elevated ventricular end-diastolic filling pressure. - Aortic valve: Trileaflet; normal thickness leaflets. No regurgitation. - Aortic root: The aortic root was normal in size. - Mitral valve: Trivial regurgitation. - Right ventricle: Systolic function was normal. - Pulmonary arteries: Systolic pressure was within the normal range.  Assessment / Plan: 1. Combined systolic and diastolic HF - EF 20 to 96% from October of 2014 - last echo from March showed improvement to 40%. Still have room to maximize her heart failure regimen - will add low dose Aldactone 25 mg to take just 1/2 daily. Check BMET today and again in one week. May be able to stop the potassium.   2. HTN - I doubt her BP is at goal.   3. Polycystic Ovarian syndrome - followed by GYN   4. Morbid obesity - this is the crux of her issues.   5. Chest pain - will arrange for GXT.   Tentatively see back in 3 months.   Patient is agreeable to this plan and will call if any problems develop in the interim.   [name], RN, [facility] [address] [place], [place]  [id number] [phone]

## 2014-05-23 ENCOUNTER — Telehealth: Payer: Self-pay | Admitting: Nurse Practitioner

## 2014-05-23 NOTE — Telephone Encounter (Signed)
New Msg    Pt returning call about lab results. Please call back.

## 2014-05-29 ENCOUNTER — Other Ambulatory Visit (INDEPENDENT_AMBULATORY_CARE_PROVIDER_SITE_OTHER): Payer: BC Managed Care – PPO | Admitting: *Deleted

## 2014-05-29 ENCOUNTER — Other Ambulatory Visit: Payer: Self-pay | Admitting: *Deleted

## 2014-05-29 ENCOUNTER — Other Ambulatory Visit: Payer: BC Managed Care – PPO

## 2014-05-29 DIAGNOSIS — I5042 Chronic combined systolic (congestive) and diastolic (congestive) heart failure: Secondary | ICD-10-CM

## 2014-05-29 DIAGNOSIS — I428 Other cardiomyopathies: Secondary | ICD-10-CM

## 2014-05-29 DIAGNOSIS — I10 Essential (primary) hypertension: Secondary | ICD-10-CM

## 2014-05-29 LAB — BASIC METABOLIC PANEL WITH GFR
BUN: 10 mg/dL (ref 6–23)
CO2: 28 meq/L (ref 19–32)
Calcium: 9.2 mg/dL (ref 8.4–10.5)
Chloride: 103 meq/L (ref 96–112)
Creatinine, Ser: 0.9 mg/dL (ref 0.4–1.2)
GFR: 91.46 mL/min (ref >60.00)
Glucose, Bld: 93 mg/dL (ref 70–99)
Potassium: 3.5 meq/L (ref 3.5–5.1)
Sodium: 140 meq/L (ref 135–145)

## 2014-05-29 MED ORDER — SPIRONOLACTONE 25 MG PO TABS: 25.0000 mg | ORAL_TABLET | Freq: Every day | ORAL | Status: DC

## 2014-06-11 ENCOUNTER — Other Ambulatory Visit: Payer: Self-pay | Admitting: *Deleted

## 2014-06-11 ENCOUNTER — Other Ambulatory Visit (INDEPENDENT_AMBULATORY_CARE_PROVIDER_SITE_OTHER): Payer: BLUE CROSS/BLUE SHIELD | Admitting: *Deleted

## 2014-06-11 DIAGNOSIS — I428 Other cardiomyopathies: Secondary | ICD-10-CM

## 2014-06-11 DIAGNOSIS — E875 Hyperkalemia: Secondary | ICD-10-CM

## 2014-06-11 DIAGNOSIS — I429 Cardiomyopathy, unspecified: Secondary | ICD-10-CM

## 2014-06-11 LAB — BASIC METABOLIC PANEL
BUN: 10 mg/dL (ref 6–23)
CO2: 27 mEq/L (ref 19–32)
Calcium: 9.3 mg/dL (ref 8.4–10.5)
Chloride: 102 mEq/L (ref 96–112)
Creatinine, Ser: 1 mg/dL (ref 0.4–1.2)
GFR: 84.95 mL/min (ref >60.00)
Glucose, Bld: 94 mg/dL (ref 70–99)
Potassium: 4.1 mEq/L (ref 3.5–5.1)
Sodium: 136 mEq/L (ref 135–145)

## 2014-06-19 ENCOUNTER — Other Ambulatory Visit: Payer: BLUE CROSS/BLUE SHIELD

## 2014-06-22 ENCOUNTER — Encounter: Payer: Self-pay | Admitting: Nurse Practitioner

## 2014-06-22 DIAGNOSIS — R0602 Shortness of breath: Secondary | ICD-10-CM

## 2014-06-25 ENCOUNTER — Other Ambulatory Visit: Payer: Self-pay | Admitting: *Deleted

## 2014-06-25 DIAGNOSIS — R0602 Shortness of breath: Secondary | ICD-10-CM

## 2014-06-25 MED ORDER — SPIRONOLACTONE 25 MG PO TABS: 25.0000 mg | ORAL_TABLET | Freq: Every day | ORAL | Status: DC

## 2014-06-25 NOTE — Telephone Encounter (Signed)
I saw this patient's 2 my chart messages -         She is to be on aldactone 25 mg a day. No potassium    I do not see where the glucose was up.        Can you call and see what is going on with her?        Thanks        [name]    ----- Message -----     From: [name], RN     Sent: 06/25/2014 12:07 PM      To: [name], NP    Subject: FW: Non-Urgent Medical Question                       ----- Message -----     From: Renee Roberson     Sent: 06/22/2014  5:00 AM      To: [address] Triage    Subject: Non-Urgent Medical Question                 Hello     I believe the new medication I'm on has developed more fluid in my lungs. Lately I have developed this cough as though my.bronchitis has come.back. I am wheezing again and coughing and have been really tired and drowsy. I haven't been well and I missed my last lab appointment. Also I notice high glucose results within my recent lab work. Is that something I need to be alarmed about ?         I saw this patient's 2 my chart messages -         She is to be on aldactone 25 mg a day. No potassium    I do not see where the glucose was up.        Can you call and see what is going on with her?        Thanks        [name]    ----- Message -----     From: [name], RN     Sent: 06/25/2014 12:07 PM      To: [name], NP    Subject: FW: Non-Urgent Medical Question                       ----- Message -----     From: Renee Roberson     Sent: 06/22/2014  5:00 AM      To: [address] Triage    Subject: Non-Urgent Medical Question                 Hello     I believe the new medication I'm on has developed more fluid in my lungs. Lately I have developed this cough as though my.bronchitis has come.back. I am wheezing again and  coughing and have been really tired and drowsy. I haven't been well and I missed my last lab appointment. Also I notice high glucose results within my recent lab work. Is that something I need to be alarmed about ?        Called pt per [name] Gerhardt's request.  Clarified with her that she should be taking Aldactone 25 mg daily.  States she has been having a cough, productive with green sputum.  No temp.  Is wheezing at night.  States she has had bronchitis in past.  Is taking  coricidin which is helping.  Advised that if cough and wheezing didn't get better then needed to see her PCP.  Will send new order in for Aldactone 25 mg daily.  Also rescheduled her BMET for tomorrow 1/26.

## 2014-06-26 ENCOUNTER — Other Ambulatory Visit (INDEPENDENT_AMBULATORY_CARE_PROVIDER_SITE_OTHER): Payer: BLUE CROSS/BLUE SHIELD | Admitting: *Deleted

## 2014-06-26 DIAGNOSIS — R0602 Shortness of breath: Secondary | ICD-10-CM

## 2014-06-26 DIAGNOSIS — E875 Hyperkalemia: Secondary | ICD-10-CM

## 2014-06-27 LAB — BASIC METABOLIC PANEL
BUN: 12 mg/dL (ref 6–23)
CO2: 26 mEq/L (ref 19–32)
Calcium: 9.4 mg/dL (ref 8.4–10.5)
Chloride: 103 mEq/L (ref 96–112)
Creatinine, Ser: 0.88 mg/dL (ref 0.40–1.20)
GFR: 95.03 mL/min (ref >60.00)
Glucose, Bld: 95 mg/dL (ref 70–99)
Potassium: 3.8 mEq/L (ref 3.5–5.1)
Sodium: 138 mEq/L (ref 135–145)

## 2014-07-03 ENCOUNTER — Ambulatory Visit (INDEPENDENT_AMBULATORY_CARE_PROVIDER_SITE_OTHER): Payer: BLUE CROSS/BLUE SHIELD | Admitting: Nurse Practitioner

## 2014-07-03 ENCOUNTER — Encounter: Payer: Self-pay | Admitting: Nurse Practitioner

## 2014-07-03 VITALS — BP 146/100 | HR 84

## 2014-07-03 DIAGNOSIS — I10 Essential (primary) hypertension: Secondary | ICD-10-CM

## 2014-07-03 DIAGNOSIS — I5042 Chronic combined systolic (congestive) and diastolic (congestive) heart failure: Secondary | ICD-10-CM

## 2014-07-03 NOTE — Progress Notes (Signed)
Exercise Treadmill Test  Pre-Exercise Testing Evaluation Rhythm: normal sinus  Rate: 84 bpm     Test  Exercise Tolerance Test Ordering MD: Daneen Schick, MD  Interpreting MD: Truitt Merle, NP  Unique Test No: 1  Treadmill:  1  Indication for ETT: chest pain - rule out ischemia  Contraindication to ETT: No   Stress Modality: exercise - treadmill  Cardiac Imaging Performed: non   Protocol: standard Bruce - maximal  Max BP:  178/77  Max MPHR (bpm):  187 85% MPR (bpm):  159  MPHR obtained (bpm):  171 % MPHR obtained:  91%  Reached 85% MPHR (min:sec):  6:00 Total Exercise Time (min-sec):  7:00  Workload in METS:  8.5 Borg Scale: 17  Reason ETT Terminated:  desired heart rate attained    ST Segment Analysis At Rest: normal ST segments - no evidence of significant ST depression With Exercise: no evidence of significant ST depression  Other Information Arrhythmia:  No Angina during ETT:  absent (0) Quality of ETT:  diagnostic  ETT Interpretation:  normal - no evidence of ischemia by ST analysis  Comments: Patient presents today for routine GXT. Recent visit she endorsed some atypical chest pain. She has chronic systolic and diastolic HF - from poorly controlled HTN.  Other issues include obesity and polycystic ovarian syndrome. Echo from September of 2014 showed EF of 20 to 25% with grade III diastolic HF but improved on her last echo - EF was improving to 40%.  Today the patient exercised on the standard Bruce protocol for a total of 7 minutes.  Fair exercise tolerance.  Adequate blood pressure response.  Clinically negative for chest pain. Test was stopped due to fatigue and achievement of target HR.  EKG negative for ischemia. No significant arrhythmia noted but had occasional PVC in early stages of exercise.   Recommendations: Continue with CV risk factor modification.  See back as planned.   Patient is agreeable to this plan and will call if any problems develop in the  interim.   [name], RN, [facility] [address] [place], [place]  [phone]

## 2014-07-16 ENCOUNTER — Ambulatory Visit (INDEPENDENT_AMBULATORY_CARE_PROVIDER_SITE_OTHER): Payer: BLUE CROSS/BLUE SHIELD | Admitting: Certified Nurse Midwife

## 2014-07-16 ENCOUNTER — Encounter: Payer: Self-pay | Admitting: Certified Nurse Midwife

## 2014-07-16 VITALS — BP 104/68 | HR 70 | Resp 16 | Ht 64.75 in | Wt 242.0 lb

## 2014-07-16 DIAGNOSIS — Z0000 Encounter for general adult medical examination without abnormal findings: Secondary | ICD-10-CM

## 2014-07-16 DIAGNOSIS — N912 Amenorrhea, unspecified: Secondary | ICD-10-CM

## 2014-07-16 DIAGNOSIS — Z124 Encounter for screening for malignant neoplasm of cervix: Secondary | ICD-10-CM

## 2014-07-16 DIAGNOSIS — Z01419 Encounter for gynecological examination (general) (routine) without abnormal findings: Secondary | ICD-10-CM

## 2014-07-16 LAB — POCT URINALYSIS DIPSTICK
Bilirubin, UA: NEGATIVE
Blood, UA: NEGATIVE
Glucose, UA: NEGATIVE
Ketones, UA: NEGATIVE
Leukocytes, UA: NEGATIVE
Nitrite, UA: NEGATIVE
Protein, UA: NEGATIVE
Urobilinogen, UA: NEGATIVE
pH, UA: 5

## 2014-07-16 NOTE — Progress Notes (Signed)
34 y.o. G1P0010 Single  African American Fe here for annual exam. Periods none with Mirena IUD. No partner change, but desires STD screening. Sees  Urgent care,prn. Sees cardiology routinely for medication management and labs. Stable with chronic heart failure. Working on [employer].No other health concerns today.  Patient's last menstrual period was 03/01/2014.          Sexually active: Yes.    The current method of family planning is IUD.    Exercising: Yes.    gym,walking & zumba Smoker:  no  Health Maintenance: Pap:  07-10-13 ASCUS HPV HR +, colpo 08-03-13 CIN1 MMG:  none Colonoscopy:  none BMD:   none TDaP:  2013 Labs: Poct urine-neg Self breast exam: done occ   reports that she has never smoked. She has never used smokeless tobacco. She reports that she does not drink alcohol or use illicit drugs.  Past Medical History  Diagnosis Date  . GERD (gastroesophageal reflux disease)   . Migraine   . HTN (hypertension)   . Gout   . Systolic heart failure   . PCOS (polycystic ovarian syndrome)   . Amenorrhea   . Depression   . STD (sexually transmitted disease)     HSV (genital)  . Infertility, female     Past Surgical History  Procedure Laterality Date  . No surgical hx      Current Outpatient Prescriptions  Medication Sig Dispense Refill  . amLODipine-benazepril (LOTREL) 5-40 MG per capsule     . carvedilol (COREG) 25 MG tablet Take 1 tablet (25 mg total) by mouth 2 (two) times daily with a meal. 60 tablet 11  . furosemide (LASIX) 40 MG tablet TAKE 1 TABLET BY MOUTH DAILY 30 tablet 6  . ibuprofen (ADVIL,MOTRIN) 800 MG tablet Take 1 tablet (800 mg total) by mouth every 8 (eight) hours as needed. 60 tablet 0  . spironolactone (ALDACTONE) 25 MG tablet Take 1 tablet (25 mg total) by mouth daily. 30 tablet 3   No current facility-administered medications for this visit.    Family History  Problem Relation Age of Onset  . Hypertension Mother   . Hypertension Father    . Thyroid disease Father   . Cancer Maternal Grandmother     ovarian or colon  . Diabetes Paternal Grandfather     ROS:  Pertinent items are noted in HPI.  Otherwise, a comprehensive ROS was negative.  Exam:   BP 104/68 mmHg  Pulse 70  Resp 16  Ht 5' 4.75" (1.645 m)  Wt 242 lb (109.77 kg)  BMI 40.57 kg/m2  LMP 03/01/2014 Height: 5' 4.75" (164.5 cm) Ht Readings from Last 3 Encounters:  07/16/14 5' 4.75" (1.645 m)  05/22/14 5\' 5"  (1.651 m)  02/19/14 5\' 5"  (1.651 m)    General appearance: alert, cooperative and appears stated age Head: Normocephalic, without obvious abnormality, atraumatic Neck: no adenopathy, supple, symmetrical, trachea midline and thyroid normal to inspection and palpation Lungs: clear to auscultation bilaterally Breasts: normal appearance, no masses or tenderness, No nipple retraction or dimpling, No nipple discharge or bleeding, No axillary or supraclavicular adenopathy Heart: regular rate and rhythm Abdomen: soft, non-tender; no masses,  no organomegaly Extremities: extremities normal, atraumatic, no cyanosis or edema Skin: Skin color, texture, turgor normal. No rashes or lesions Lymph nodes: Cervical, supraclavicular, and axillary nodes normal. No abnormal inguinal nodes palpated Neurologic: Grossly normal   Pelvic: External genitalia:  no lesions  Urethra:  normal appearing urethra with no masses, tenderness or lesions              Bartholin's and Skene's: normal                 Vagina: normal appearing vagina with normal color and discharge, no lesions              Cervix: normal, non tender, no lesions, IUD string noted              Pap taken: Yes.   Bimanual Exam:  Uterus:  normal size, contour, position, consistency, mobility, non-tender              Adnexa: normal adnexa and no mass, fullness, tenderness               Rectovaginal: Confirms               Anus:  normal sphincter tone, no lesions  Chaperone present: Yes  A:  Well  Woman with normal exam  Contraception Mirena IUD  Chronic heart failure with cardiology management  STD screening  History of ASCUS +HPVHR with CIN1 with colpo for follow up today.  P:   Reviewed health and wellness pertinent to exam  Aware of warning signs of IUD Removal due 2019  Pap smear taken today with HPVHR, follow up from [facility]. If negative repeat one year,, if not per results  Continue follow up with MD as recommended  Labs STD panel, GC, Chlamydia  . counseled on breast self exam, adequate intake of calcium and vitamin D, diet and exercise return annually or prn  An After Visit Summary was printed and given to the patient.

## 2014-07-16 NOTE — Patient Instructions (Signed)
EXERCISE AND DIET:  We recommended that you start or continue a regular exercise program for good health. Regular exercise means any activity that makes your heart beat faster and makes you sweat.  We recommend exercising at least 30 minutes per day at least 3 days a week, preferably 4 or 5.  We also recommend a diet low in fat and sugar.  Inactivity, poor dietary choices and obesity can cause diabetes, heart attack, stroke, and kidney damage, among others.    ALCOHOL AND SMOKING:  Women should limit their alcohol intake to no more than 7 drinks/beers/glasses of wine (combined, not each!) per week. Moderation of alcohol intake to this level decreases your risk of breast cancer and liver damage. And of course, no recreational drugs are part of a healthy lifestyle.  And absolutely no smoking or even second hand smoke. Most people know smoking can cause heart and lung diseases, but did you know it also contributes to weakening of your bones? Aging of your skin?  Yellowing of your teeth and nails?  CALCIUM AND VITAMIN D:  Adequate intake of calcium and Vitamin D are recommended.  The recommendations for exact amounts of these supplements seem to change often, but generally speaking 600 mg of calcium (either carbonate or citrate) and 800 units of Vitamin D per day seems prudent. Certain women may benefit from higher intake of Vitamin D.  If you are among these women, your doctor will have told you during your visit.    PAP SMEARS:  Pap smears, to check for cervical cancer or precancers,  have traditionally been done yearly, although recent scientific advances have shown that most women can have pap smears less often.  However, every woman still should have a physical exam from her gynecologist every year. It will include a breast check, inspection of the vulva and vagina to check for abnormal growths or skin changes, a visual exam of the cervix, and then an exam to evaluate the size and shape of the uterus and  ovaries.  And after 34 years of age, a rectal exam is indicated to check for rectal cancers. We will also provide age appropriate advice regarding health maintenance, like when you should have certain vaccines, screening for sexually transmitted diseases, bone density testing, colonoscopy, mammograms, etc.   MAMMOGRAMS:  All women over 40 years old should have a yearly mammogram. Many facilities now offer a "3D" mammogram, which may cost around $50 extra out of pocket. If possible,  we recommend you accept the option to have the 3D mammogram performed.  It both reduces the number of women who will be called back for extra views which then turn out to be normal, and it is better than the routine mammogram at detecting truly abnormal areas.    COLONOSCOPY:  Colonoscopy to screen for colon cancer is recommended for all women at age 50.  We know, you hate the idea of the prep.  We agree, BUT, having colon cancer and not knowing it is worse!!  Colon cancer so often starts as a polyp that can be seen and removed at colonscopy, which can quite literally save your life!  And if your first colonoscopy is normal and you have no family history of colon cancer, most women don't have to have it again for 10 years.  Once every ten years, you can do something that may end up saving your life, right?  We will be happy to help you get it scheduled when you are ready.    Be sure to check your insurance coverage so you understand how much it will cost.  It may be covered as a preventative service at no cost, but you should check your particular policy.     Exercise to Lose Weight Exercise and a healthy diet may help you lose weight. Your doctor may suggest specific exercises. EXERCISE IDEAS AND TIPS  Choose low-cost things you enjoy doing, such as walking, bicycling, or exercising to workout videos.  Take stairs instead of the elevator.  Walk during your lunch break.  Park your car further away from work or  school.  Go to a gym or an exercise class.  Start with 5 to 10 minutes of exercise each day. Build up to 30 minutes of exercise 4 to 6 days a week.  Wear shoes with good support and comfortable clothes.  Stretch before and after working out.  Work out until you breathe harder and your heart beats faster.  Drink extra water when you exercise.  Do not do so much that you hurt yourself, feel dizzy, or get very short of breath. Exercises that burn about 150 calories:  Running 1  miles in 15 minutes.  Playing volleyball for 45 to 60 minutes.  Washing and waxing a car for 45 to 60 minutes.  Playing touch football for 45 minutes.  Walking 1  miles in 35 minutes.  Pushing a stroller 1  miles in 30 minutes.  Playing basketball for 30 minutes.  Raking leaves for 30 minutes.  Bicycling 5 miles in 30 minutes.  Walking 2 miles in 30 minutes.  Dancing for 30 minutes.  Shoveling snow for 15 minutes.  Swimming laps for 20 minutes.  Walking up stairs for 15 minutes.  Bicycling 4 miles in 15 minutes.  Gardening for 30 to 45 minutes.  Jumping rope for 15 minutes.  Washing windows or floors for 45 to 60 minutes. Document Released: 06/20/2010 Document Revised: 08/10/2011 Document Reviewed: 06/20/2010 [facility] Patient Information 2015 [place], [place]. This information is not intended to replace advice given to you by your health care provider. Make sure you discuss any questions you have with your health care provider.

## 2014-07-17 LAB — STD PANEL
HIV 1&2 Ab, 4th Generation: NONREACTIVE
Hepatitis B Surface Ag: NEGATIVE

## 2014-07-18 LAB — IPS N GONORRHOEA AND CHLAMYDIA BY PCR

## 2014-07-19 LAB — IPS PAP TEST WITH HPV

## 2014-07-22 NOTE — Progress Notes (Signed)
Reviewed personally.  M. Suzanne Rosalina Dingwall, MD.  

## 2014-07-24 ENCOUNTER — Encounter: Payer: Self-pay | Admitting: Certified Nurse Midwife

## 2014-07-24 ENCOUNTER — Encounter: Payer: Self-pay | Admitting: Nurse Practitioner

## 2014-07-25 ENCOUNTER — Emergency Department (HOSPITAL_COMMUNITY)
Admission: EM | Admit: 2014-07-25 | Discharge: 2014-07-25 | Disposition: A | Discharge status: Home / Self Care | Payer: BLUE CROSS/BLUE SHIELD | Attending: Emergency Medicine | Admitting: Emergency Medicine

## 2014-07-25 ENCOUNTER — Encounter (HOSPITAL_COMMUNITY): Payer: Self-pay | Admitting: Emergency Medicine

## 2014-07-25 DIAGNOSIS — Z8659 Personal history of other mental and behavioral disorders: Secondary | ICD-10-CM | POA: Insufficient documentation

## 2014-07-25 DIAGNOSIS — I5020 Unspecified systolic (congestive) heart failure: Secondary | ICD-10-CM | POA: Diagnosis not present

## 2014-07-25 DIAGNOSIS — Z8619 Personal history of other infectious and parasitic diseases: Secondary | ICD-10-CM | POA: Insufficient documentation

## 2014-07-25 DIAGNOSIS — Z8719 Personal history of other diseases of the digestive system: Secondary | ICD-10-CM | POA: Insufficient documentation

## 2014-07-25 DIAGNOSIS — R2241 Localized swelling, mass and lump, right lower limb: Secondary | ICD-10-CM | POA: Diagnosis present

## 2014-07-25 DIAGNOSIS — Z8639 Personal history of other endocrine, nutritional and metabolic disease: Secondary | ICD-10-CM | POA: Diagnosis not present

## 2014-07-25 DIAGNOSIS — Z791 Long term (current) use of non-steroidal anti-inflammatories (NSAID): Secondary | ICD-10-CM | POA: Insufficient documentation

## 2014-07-25 DIAGNOSIS — I10 Essential (primary) hypertension: Secondary | ICD-10-CM | POA: Diagnosis not present

## 2014-07-25 DIAGNOSIS — Z8742 Personal history of other diseases of the female genital tract: Secondary | ICD-10-CM | POA: Insufficient documentation

## 2014-07-25 DIAGNOSIS — M109 Gout, unspecified: Secondary | ICD-10-CM | POA: Diagnosis not present

## 2014-07-25 DIAGNOSIS — Z79899 Other long term (current) drug therapy: Secondary | ICD-10-CM | POA: Diagnosis not present

## 2014-07-25 MED ORDER — HYDROCODONE-ACETAMINOPHEN 5-325 MG PO TABS: 1.0000 | ORAL_TABLET | Freq: Four times a day (QID) | ORAL | Status: DC | PRN

## 2014-07-25 MED ORDER — HYDROCODONE-ACETAMINOPHEN 5-325 MG PO TABS
1.0000 | ORAL_TABLET | Freq: Once | ORAL | Status: AC
  Administered 2014-07-25: 1 via ORAL
  Filled 2014-07-25: qty 1

## 2014-07-25 MED ORDER — HYDROCODONE-ACETAMINOPHEN 5-325 MG PO TABS: 1.0000 | ORAL_TABLET | ORAL | Status: DC | PRN

## 2014-07-25 NOTE — ED Provider Notes (Signed)
CSN: [id number]     Arrival date & time 07/25/14  7062 History   None    Chief Complaint  Patient presents with  . Foot Swelling     (Consider location/radiation/quality/duration/timing/severity/associated sxs/prior Treatment) HPI Comments: Patient is a 34 yo F PMHx significant for GERD, HTN, Gout, CHF, Depression presenting to the ED for evaluation of right great toe pain, redness, and warmth that started over the last few days without injury. Patient states this feels like previous gout flares. She was seen by [facility] prescribed colchicine which she was been taking without relief. She had been taking indomethacin with improvement of pain but was advised to stop by [facility]. Denies any fevers, chills, nausea, vomiting, diarrhea, CP, SOB.    Past Medical History  Diagnosis Date  . GERD (gastroesophageal reflux disease)   . Migraine   . HTN (hypertension)   . Gout   . Systolic heart failure   . PCOS (polycystic ovarian syndrome)   . Amenorrhea   . Depression   . STD (sexually transmitted disease)     HSV (genital)  . Infertility, female    Past Surgical History  Procedure Laterality Date  . No surgical hx     Family History  Problem Relation Age of Onset  . Hypertension Mother   . Hypertension Father   . Thyroid disease Father   . Cancer Maternal Grandmother     ovarian or colon  . Diabetes Paternal Grandfather    History  Substance Use Topics  . Smoking status: Never Smoker   . Smokeless tobacco: Never Used  . Alcohol Use: No   OB History    Gravida Para Term Preterm AB TAB SAB Ectopic Multiple Living   1 0 0 0 1  1   0     Review of Systems  Constitutional: Negative for fever and chills.  Musculoskeletal: Positive for arthralgias.  Skin: Positive for color change.  All other systems reviewed and are negative.     Allergies  Review of patient's allergies indicates no known allergies.  Home Medications   Prior to Admission medications   Medication Sig Start  Date End Date Taking? Authorizing Provider  amLODipine-benazepril (LOTREL) 5-40 MG per capsule Take 1 capsule by mouth daily.  12/27/13  Yes Historical Provider, MD  carvedilol (COREG) 25 MG tablet Take 1 tablet (25 mg total) by mouth 2 (two) times daily with a meal. 11/13/13  Yes [place], MD  colchicine 0.6 MG tablet Take 0.6 mg by mouth daily.   Yes Historical Provider, MD  furosemide (LASIX) 40 MG tablet TAKE 1 TABLET BY MOUTH DAILY 04/30/14  Yes Belva Crome III, MD  ibuprofen (ADVIL,MOTRIN) 800 MG tablet Take 1 tablet (800 mg total) by mouth every 8 (eight) hours as needed. Patient taking differently: Take 800 mg by mouth every 8 (eight) hours as needed for moderate pain.  01/15/14  Yes Lyman Speller, MD  indomethacin (INDOCIN) 25 MG capsule Take 25 mg by mouth 2 (two) times daily as needed for mild pain.   Yes Historical Provider, MD  spironolactone (ALDACTONE) 25 MG tablet Take 1 tablet (25 mg total) by mouth daily. 06/25/14  Yes [name], NP  HYDROcodone-acetaminophen (NORCO/VICODIN) 5-325 MG per tablet Take 1-2 tablets by mouth every 4 (four) hours as needed for severe pain. 07/25/14   Deb Loudin L Rheagan Nayak, PA-C  HYDROcodone-acetaminophen (NORCO/VICODIN) 5-325 MG per tablet Take 1-2 tablets by mouth every 6 (six) hours as needed for severe  pain. 07/25/14   Mardy Lucier L Wendal Wilkie, PA-C   BP 105/64 mmHg  Pulse 82  Temp(Src) 98.7 F (37.1 C) (Oral)  Resp 20  Ht 5\' 5"  (1.651 m)  Wt 243 lb (110.224 kg)  BMI 40.44 kg/m2  SpO2 99%  LMP 03/01/2014 Physical Exam  Constitutional: She is oriented to person, place, and time. She appears well-developed and well-nourished. No distress.  HENT:  Head: Normocephalic and atraumatic.  Right Ear: External ear normal.  Left Ear: External ear normal.  Nose: Nose normal.  Mouth/Throat: Oropharynx is clear and moist.  Eyes: Conjunctivae are normal.  Neck: Normal range of motion. Neck supple.  Cardiovascular: Normal rate, regular  rhythm, normal heart sounds and intact distal pulses.   Pulmonary/Chest: Effort normal.  Abdominal: Soft. There is no tenderness.  Musculoskeletal: Normal range of motion. She exhibits tenderness.       Feet:  Neurological: She is alert and oriented to person, place, and time.  Skin: Skin is warm and dry. She is not diaphoretic.  Psychiatric: She has a normal mood and affect.  Nursing note and vitals reviewed.   ED Course  Procedures (including critical care time) Medications  HYDROcodone-acetaminophen (NORCO/VICODIN) 5-325 MG per tablet 1 tablet (1 tablet Oral Given 07/25/14 0524)  HYDROcodone-acetaminophen (NORCO/VICODIN) 5-325 MG per tablet 1 tablet (1 tablet Oral Given 07/25/14 0546)    Labs Review Labs Reviewed - No data to display  Imaging Review No results found.   EKG Interpretation None      MDM   Final diagnoses:  Acute gout of right foot, unspecified cause    Filed Vitals:   07/25/14 0515  BP:   Pulse: 82  Temp:   Resp: 20   Afebrile, NAD, non-toxic appearing, AAOx4.  Neurovascularly intact. Normal sensation. No evidence of compartment syndrome. R great toe mildly erythematous, warm to the touch. ROM intact. History and physical exam consistent with gout. Will add pain medication to colchicine regimen. Advised PCP f/u. Return precautions discussed. Patient is agreeable to plan. Patient is stable at time of discharge      Harlow Mares, PA-C 07/25/14 [place], MD 07/25/14 [phone]

## 2014-07-25 NOTE — ED Notes (Signed)
Pt reports her right big toe is swollen and very painful, pt reports she was tested for gout two years ago when the same thing happened and was told she did not have gout, however pt reports she believes it is. Pt's right foot swollen upon assessment, warm to the touch, pulses present. A&O X4.

## 2014-07-25 NOTE — Discharge Instructions (Signed)
Please follow up with your primary care physician in 1-2 days. If you do not have one please call the [facility] number listed above.Please take pain medication and/or muscle relaxants as prescribed and as needed for pain. Please do not drive on narcotic pain medication or on muscle relaxants. Please read all discharge instructions and return precautions.    Gout Gout is an inflammatory arthritis caused by a buildup of uric acid crystals in the joints. Uric acid is a chemical that is normally present in the blood. When the level of uric acid in the blood is too high it can form crystals that deposit in your joints and tissues. This causes joint redness, soreness, and swelling (inflammation). Repeat attacks are common. Over time, uric acid crystals can form into masses (tophi) near a joint, destroying bone and causing disfigurement. Gout is treatable and often preventable. CAUSES  The disease begins with elevated levels of uric acid in the blood. Uric acid is produced by your body when it breaks down a naturally found substance called purines. Certain foods you eat, such as meats and fish, contain high amounts of purines. Causes of an elevated uric acid level include:  Being passed down from parent to child (heredity).  Diseases that cause increased uric acid production (such as obesity, psoriasis, and certain cancers).  Excessive alcohol use.  Diet, especially diets rich in meat and seafood.  Medicines, including certain cancer-fighting medicines (chemotherapy), water pills (diuretics), and aspirin.  Chronic kidney disease. The kidneys are no longer able to remove uric acid well.  Problems with metabolism. Conditions strongly associated with gout include:  Obesity.  High blood pressure.  High cholesterol.  Diabetes. Not everyone with elevated uric acid levels gets gout. It is not understood why some people get gout and others do not. Surgery, joint injury, and  eating too much of certain foods are some of the factors that can lead to gout attacks. SYMPTOMS   An attack of gout comes on quickly. It causes intense pain with redness, swelling, and warmth in a joint.  Fever can occur.  Often, only one joint is involved. Certain joints are more commonly involved:  Base of the big toe.  Knee.  Ankle.  Wrist.  Finger. Without treatment, an attack usually goes away in a few days to weeks. Between attacks, you usually will not have symptoms, which is different from many other forms of arthritis. DIAGNOSIS  Your caregiver will suspect gout based on your symptoms and exam. In some cases, tests may be recommended. The tests may include:  Blood tests.  Urine tests.  X-rays.  Joint fluid exam. This exam requires a needle to remove fluid from the joint (arthrocentesis). Using a microscope, gout is confirmed when uric acid crystals are seen in the joint fluid. TREATMENT  There are two phases to gout treatment: treating the sudden onset (acute) attack and preventing attacks (prophylaxis).  Treatment of an Acute Attack.  Medicines are used. These include anti-inflammatory medicines or steroid medicines.  An injection of steroid medicine into the affected joint is sometimes necessary.  The painful joint is rested. Movement can worsen the arthritis.  You may use warm or cold treatments on painful joints, depending which works best for you.  Treatment to Prevent Attacks.  If you suffer from frequent gout attacks, your caregiver may advise preventive medicine. These medicines are started after the acute attack subsides. These medicines either help your kidneys eliminate uric acid from your body or decrease  your uric acid production. You may need to stay on these medicines for a very long time.  The early phase of treatment with preventive medicine can be associated with an increase in acute gout attacks. For this reason, during the first few months  of treatment, your caregiver may also advise you to take medicines usually used for acute gout treatment. Be sure you understand your caregiver's directions. Your caregiver may make several adjustments to your medicine dose before these medicines are effective.  Discuss dietary treatment with your caregiver or dietitian. Alcohol and drinks high in sugar and fructose and foods such as meat, poultry, and seafood can increase uric acid levels. Your caregiver or dietitian can advise you on drinks and foods that should be limited. HOME CARE INSTRUCTIONS   Do not take aspirin to relieve pain. This raises uric acid levels.  Only take over-the-counter or prescription medicines for pain, discomfort, or fever as directed by your caregiver.  Rest the joint as much as possible. When in bed, keep sheets and blankets off painful areas.  Keep the affected joint raised (elevated).  Apply warm or cold treatments to painful joints. Use of warm or cold treatments depends on which works best for you.  Use crutches if the painful joint is in your leg.  Drink enough fluids to keep your urine clear or pale yellow. This helps your body get rid of uric acid. Limit alcohol, sugary drinks, and fructose drinks.  Follow your dietary instructions. Pay careful attention to the amount of protein you eat. Your daily diet should emphasize fruits, vegetables, whole grains, and fat-free or low-fat milk products. Discuss the use of coffee, vitamin C, and cherries with your caregiver or dietitian. These may be helpful in lowering uric acid levels.  Maintain a healthy body weight. SEEK MEDICAL CARE IF:   You develop diarrhea, vomiting, or any side effects from medicines.  You do not feel better in 24 hours, or you are getting worse. SEEK IMMEDIATE MEDICAL CARE IF:   Your joint becomes suddenly more tender, and you have chills or a fever. MAKE SURE YOU:   Understand these instructions.  Will watch your condition.  Will  get help right away if you are not doing well or get worse. Document Released: 05/15/2000 Document Revised: 10/02/2013 Document Reviewed: 12/30/2011 [facility] Patient Information 2015 [place], [place]. This information is not intended to replace advice given to you by your health care provider. Make sure you discuss any questions you have with your health care provider.

## 2014-08-20 ENCOUNTER — Encounter: Payer: Self-pay | Admitting: Physician Assistant

## 2014-08-20 ENCOUNTER — Ambulatory Visit (INDEPENDENT_AMBULATORY_CARE_PROVIDER_SITE_OTHER): Payer: BLUE CROSS/BLUE SHIELD | Admitting: Physician Assistant

## 2014-08-20 ENCOUNTER — Ambulatory Visit: Payer: BC Managed Care – PPO | Admitting: Nurse Practitioner

## 2014-08-20 VITALS — HR 98 | Ht 65.0 in | Wt 240.8 lb

## 2014-08-20 DIAGNOSIS — I10 Essential (primary) hypertension: Secondary | ICD-10-CM | POA: Diagnosis not present

## 2014-08-20 DIAGNOSIS — I5042 Chronic combined systolic (congestive) and diastolic (congestive) heart failure: Secondary | ICD-10-CM | POA: Diagnosis not present

## 2014-08-20 DIAGNOSIS — I959 Hypotension, unspecified: Secondary | ICD-10-CM | POA: Diagnosis not present

## 2014-08-20 DIAGNOSIS — I5021 Acute systolic (congestive) heart failure: Secondary | ICD-10-CM | POA: Diagnosis not present

## 2014-08-20 HISTORY — DX: Hypotension, unspecified: I95.9

## 2014-08-20 MED ORDER — POTASSIUM CHLORIDE CRYS ER 20 MEQ PO TBCR: 20.0000 meq | EXTENDED_RELEASE_TABLET | Freq: Every day | ORAL | Status: DC

## 2014-08-20 NOTE — Assessment & Plan Note (Addendum)
See hypotensive episode above. Stop Aldactone.

## 2014-08-20 NOTE — Progress Notes (Signed)
Cardiology Office Note   Date:  08/20/2014   ID:  Renee Roberson, DOB 1981/03/23, MRN [id number]  PCP:  Lynne Logan, MD  Cardiologist:  Daneen Schick, M.D.  Chief Complaint:    History of Present Illness: Renee Roberson is a 34 y.o. female who presents for follow-up. She has history of hypertensive heart disease with improved systolic function EF 77% in September 2014 improved to 40-45% in March 2015 as a pressure control improved. She also has polycystic ovarian syndrome. She last saw Truitt Merle, NP in December 2015 at which time she was having some chest pain and GXT ordered. This was done on 07/03/14. She exercised 7 minutes without chest pain and achieved her target heart rate. There was no EKG changes or arrhythmias noted. Aldactone 25 mg one half tablet added daily for heart failure.  Patient comes in today initially feeling well. When she walked into the examining room she became quite dizzy and her blood pressure was 85/68. She says she's never had anything like this happen before. She took her medications about 10 AM this morning without eating. She is trying to lose weight and had a light dinner last night about 7 PM and hasn't had anything since then. It is currently 12:55 PM. After giving her crackers, a roll, in some Tea she is feeling better and her blood pressures come up to 96/70. Patient denies any chest pain, heart failure symptoms, edema, dyspnea or syncope.    Past Medical History  Diagnosis Date  . GERD (gastroesophageal reflux disease)   . Migraine   . HTN (hypertension)   . Gout   . Systolic heart failure   . PCOS (polycystic ovarian syndrome)   . Amenorrhea   . Depression   . STD (sexually transmitted disease)     HSV (genital)  . Infertility, female     Past Surgical History  Procedure Laterality Date  . No surgical hx       Current Outpatient Prescriptions  Medication Sig Dispense Refill  . amLODipine-benazepril (LOTREL) 5-40 MG per capsule  Take 1 capsule by mouth daily.     . carvedilol (COREG) 25 MG tablet Take 1 tablet (25 mg total) by mouth 2 (two) times daily with a meal. 60 tablet 11  . furosemide (LASIX) 40 MG tablet TAKE 1 TABLET BY MOUTH DAILY 30 tablet 6  . HYDROcodone-acetaminophen (NORCO/VICODIN) 5-325 MG per tablet Take 1-2 tablets by mouth every 6 (six) hours as needed for moderate pain.    Marland Kitchen ibuprofen (ADVIL,MOTRIN) 800 MG tablet Take 1 tablet (800 mg total) by mouth every 8 (eight) hours as needed. (Patient taking differently: Take 800 mg by mouth every 8 (eight) hours as needed for moderate pain. ) 60 tablet 0  . spironolactone (ALDACTONE) 25 MG tablet Take 1 tablet (25 mg total) by mouth daily. 30 tablet 3   No current facility-administered medications for this visit.    Allergies:   Review of patient's allergies indicates no known allergies.    Social History:  The patient  reports that she has never smoked. She has never used smokeless tobacco. She reports that she does not drink alcohol or use illicit drugs.   Family History:  The patient'sfamily history includes Cancer in her maternal grandmother; Diabetes in her paternal grandfather; Hypertension in her father, mother, and sister; Thyroid disease in her father.    ROS:  Please see the history of present illness.   Otherwise, review of systems are positive for none.  All other systems are reviewed and negative.    PHYSICAL EXAM: Pulse 98  Ht 5\' 5"  (1.651 m)  Wt 240 lb 12.8 oz (109.226 kg)  BMI 40.07 kg/m2 GEN: Well nourished, well developed, in no acute distress HEENT: normal Neck: no JVD, HJR, carotid bruits, or masses Cardiac: RRR; no gallop ,murmurs, rubs, thrill or heave,no edema,   Respiratory:  clear to auscultation bilaterally, normal work of breathing GI: soft, nontender, nondistended, + BS MS: no deformity or atrophy Extremities: without cyanosis, clubbing, edema, good distal pulses bilaterally.  Skin: warm and dry, no rash Neuro:   Strength and sensation are intact Psych: euthymic mood, full affect   EKG:  EKG is ordered today. The ekg ordered today demonstrates normal sinus rhythm, no acute change  Recent Labs: 06/26/2014: BUN 12; Creatinine 0.88; Potassium 3.8; Sodium 138    Lipid Panel No results found for: CHOL, TRIG, HDL, CHOLHDL, VLDL, LDLCALC, LDLDIRECT    Wt Readings from Last 3 Encounters:  07/25/14 243 lb (110.224 kg)  07/16/14 242 lb (109.77 kg)  05/22/14 242 lb 1.9 oz (109.825 kg)      Other studies Reviewed: Additional studies/ records that were reviewed today include and review of the records demonstrates:  GXT last month was normal  ASSESSMENT AND PLAN: Hypotension Patient walked in the office today and became dizzy and was found to be hypotensive. She did take all her her medications this morning without eating which is unusual for her. Aldactone was started a few months back. She says this had made her sleepy and not feeling well the past. She has not eaten since last night and has been dieting trying to lose weight. We did give her food in the office and her blood pressure came up. Her blood sugar was 128. She is feeling better. We'll stop Aldactone. Resume K Dur 20 mEq once a day. Follow-up with Dr. Tamala Julian in 3 months. I asked her to call us if her blood pressures go up or down. She checks it twice a day.   Chronic combined systolic and diastolic heart failure No evidence of heart failure and exam.   Accelerated essential hypertension See hypotensive episode above. Stop Aldactone.      Sumner Boast, PA-C  08/20/2014 12:32 PM    [facility] [address], [place], [place]  [id number] Phone: [phone]; Fax: [phone]

## 2014-08-20 NOTE — Assessment & Plan Note (Signed)
Patient walked in the office today and became dizzy and was found to be hypotensive. She did take all her her medications this morning without eating which is unusual for her. Aldactone was started a few months back. She says this had made her sleepy and not feeling well the past. She has not eaten since last night and has been dieting trying to lose weight. We did give her food in the office and her blood pressure came up. Her blood sugar was 128. She is feeling better. We'll stop Aldactone. Resume K Dur 20 mEq once a day. Follow-up with Dr. Tamala Julian in 3 months. I asked her to call us if her blood pressures go up or down. She checks it twice a day.

## 2014-08-20 NOTE — Patient Instructions (Addendum)
Stop taking aldactone    Your physician recommends that you schedule a follow-up appointment in:  Dr Tamala Julian in 3 Months

## 2014-08-20 NOTE — Assessment & Plan Note (Signed)
No evidence of heart failure and exam. 

## 2014-10-04 ENCOUNTER — Other Ambulatory Visit: Payer: Self-pay | Admitting: Interventional Cardiology

## 2014-11-19 ENCOUNTER — Ambulatory Visit: Payer: BLUE CROSS/BLUE SHIELD | Admitting: Interventional Cardiology

## 2014-11-22 NOTE — Addendum Note (Signed)
Addended by: Claude Manges on: 11/22/2014 08:49 AM   Modules accepted: Orders

## 2014-12-05 ENCOUNTER — Other Ambulatory Visit: Payer: Self-pay | Admitting: Interventional Cardiology

## 2014-12-06 ENCOUNTER — Other Ambulatory Visit: Payer: Self-pay | Admitting: Interventional Cardiology

## 2014-12-29 ENCOUNTER — Other Ambulatory Visit: Payer: Self-pay | Admitting: Obstetrics & Gynecology

## 2014-12-31 NOTE — Telephone Encounter (Signed)
Medication refill request: Ibuprofen 800 mg  Last AEX: 07/16/14 with DL  Next AEX: 07/23/15 with  DL Last MMG (if hormonal medication request): n/a Refill authorized: Please advise.

## 2015-01-02 ENCOUNTER — Encounter: Payer: Self-pay | Admitting: Nurse Practitioner

## 2015-01-02 ENCOUNTER — Ambulatory Visit (INDEPENDENT_AMBULATORY_CARE_PROVIDER_SITE_OTHER): Payer: BLUE CROSS/BLUE SHIELD | Admitting: Nurse Practitioner

## 2015-01-02 VITALS — BP 110/80 | HR 86 | Ht 64.0 in | Wt 245.1 lb

## 2015-01-02 DIAGNOSIS — I10 Essential (primary) hypertension: Secondary | ICD-10-CM | POA: Diagnosis not present

## 2015-01-02 DIAGNOSIS — I5042 Chronic combined systolic (congestive) and diastolic (congestive) heart failure: Secondary | ICD-10-CM | POA: Diagnosis not present

## 2015-01-02 DIAGNOSIS — R0602 Shortness of breath: Secondary | ICD-10-CM | POA: Diagnosis not present

## 2015-01-02 LAB — BASIC METABOLIC PANEL
BUN: 21 mg/dL (ref 6–23)
CO2: 31 mEq/L (ref 19–32)
Calcium: 9.1 mg/dL (ref 8.4–10.5)
Chloride: 100 mEq/L (ref 96–112)
Creatinine, Ser: 1.37 mg/dL — ABNORMAL HIGH (ref 0.40–1.20)
GFR: 56.84 mL/min — ABNORMAL LOW (ref >60.00)
Glucose, Bld: 97 mg/dL (ref 70–99)
Potassium: 3.9 mEq/L (ref 3.5–5.1)
Sodium: 139 mEq/L (ref 135–145)

## 2015-01-02 LAB — CBC
HCT: 39.1 % (ref 36.0–46.0)
Hemoglobin: 12.8 g/dL (ref 12.0–15.0)
MCHC: 32.9 g/dL (ref 30.0–36.0)
MCV: 89.7 fl (ref 78.0–100.0)
Platelets: 275 10*3/uL (ref 150.0–400.0)
RBC: 4.36 Mil/uL (ref 3.87–5.11)
RDW: 14.3 % (ref 11.5–15.5)
WBC: 6.7 10*3/uL (ref 4.0–10.5)

## 2015-01-02 LAB — HEPATIC FUNCTION PANEL
ALT: 18 U/L (ref 0–35)
AST: 12 U/L (ref 0–37)
Albumin: 3.9 g/dL (ref 3.5–5.2)
Alkaline Phosphatase: 41 U/L (ref 39–117)
Bilirubin, Direct: 0.1 mg/dL (ref 0.0–0.3)
Total Bilirubin: 0.6 mg/dL (ref 0.2–1.2)
Total Protein: 7 g/dL (ref 6.0–8.3)

## 2015-01-02 LAB — TSH: TSH: 1.49 u[IU]/mL (ref 0.35–4.50)

## 2015-01-02 MED ORDER — AMLODIPINE BESY-BENAZEPRIL HCL 5-40 MG PO CAPS: 1.0000 | ORAL_CAPSULE | Freq: Every day | ORAL | Status: DC

## 2015-01-02 NOTE — Progress Notes (Signed)
CARDIOLOGY OFFICE NOTE  Date:  01/02/2015    Renee Roberson Date of Birth: 27-Sep-1980 Medical Record #[id number]  PCP:  Lynne Logan, MD  Cardiologist:  Tamala Julian    Chief Complaint  Patient presents with  . FU for NICM/HTN    Seen for Dr. Tamala Julian    History of Present Illness: Renee Roberson is a 34 y.o. female who presents today for a 3 month check but this is really a 5 month visit. Seen for Dr. Tamala Julian. She has history of hypertensive heart disease with improved systolic function EF 85% in September 2014 improved to 40-45% in March 2015 as blood pressure control improved. She also has polycystic ovarian syndrome. Other issues as noted below.  Last saw me in December 2015 at which time she was having some chest pain and GXT ordered. This was done on 07/03/14. She exercised 7 minutes without chest pain and achieved her target heart rate. There was no EKG changes or arrhythmias noted. Aldactone 25 mg one half tablet added daily for heart failure.  Seen back in March by Ermalinda Barrios, PA - she was acutely hypotensive. Had had her medicines and not eaten and actively trying to lose weight. Aldactone was stopped.   Comes in today. Here alone. She is doing ok. More limited by some lower back pain. Using Naproxen once a day. Not short of breath. No chest pain. Weight is up. She is trying to swim and eat better but notes she needs some "motivation". Considering hiring a Physiological scientist. Not dizzy or lightheaded. No recent labs.    Past Medical History  Diagnosis Date  . GERD (gastroesophageal reflux disease)   . Migraine   . HTN (hypertension)   . Gout   . Systolic heart failure   . PCOS (polycystic ovarian syndrome)   . Amenorrhea   . Depression   . STD (sexually transmitted disease)     HSV (genital)  . Infertility, female     Past Surgical History  Procedure Laterality Date  . No surgical hx       Medications: Current Outpatient Prescriptions  Medication Sig  Dispense Refill  . amLODipine-benazepril (LOTREL) 5-40 MG per capsule Take 1 capsule by mouth daily.     . carvedilol (COREG) 25 MG tablet TAKE 1 TABLET BY MOUTH TWICE DAILY WITH A MEAL 60 tablet 1  . furosemide (LASIX) 40 MG tablet TAKE 1 TABLET BY MOUTH DAILY 30 tablet 1  . HYDROcodone-acetaminophen (NORCO/VICODIN) 5-325 MG per tablet Take 1-2 tablets by mouth every 6 (six) hours as needed for moderate pain.    Marland Kitchen ibuprofen (ADVIL,MOTRIN) 800 MG tablet Take 800 mg by mouth every 8 (eight) hours as needed (adominal pain).    . naproxen (NAPROSYN) 500 MG tablet TK 1 T PO BID PRN for back pain  2  . oxyCODONE-acetaminophen (PERCOCET/ROXICET) 5-325 MG per tablet TK 1 T PO  Q 4-6 H PRN P tooth extraction  0  . potassium chloride SA (KLOR-CON M20) 20 MEQ tablet Take 1 tablet (20 mEq total) by mouth daily. 30 tablet 6   No current facility-administered medications for this visit.    Allergies: No Known Allergies  Social History: The patient  reports that she has never smoked. She has never used smokeless tobacco. She reports that she does not drink alcohol or use illicit drugs.   Family History: The patient's family history includes Cancer in her maternal grandmother; Diabetes in her paternal grandfather; Hypertension in her father,  mother, and sister; Thyroid disease in her father.   Review of Systems: Please see the history of present illness.   Otherwise, the review of systems is positive for none.   All other systems are reviewed and negative.   Physical Exam: VS:  BP 110/80 mmHg  Pulse 86  Ht 5\' 4"  (1.626 m)  Wt 245 lb 1.9 oz (111.186 kg)  BMI 42.05 kg/m2  SpO2 98% .  BMI Body mass index is 42.05 kg/(m^2).  Wt Readings from Last 3 Encounters:  01/02/15 245 lb 1.9 oz (111.186 kg)  08/20/14 240 lb 12.8 oz (109.226 kg)  07/25/14 243 lb (110.224 kg)    General: Pleasant. Little giddy. Well developed, well nourished and in no acute distress. She remains obese. Weight is up 5 pounds.    HEENT: Normal. Neck: Supple, no JVD, carotid bruits, or masses noted.  Cardiac: Regular rate and rhythm. No murmurs, rubs, or gallops. No edema.  Respiratory:  Lungs are clear to auscultation bilaterally with normal work of breathing.  GI: Soft and nontender.  MS: No deformity or atrophy. Gait and ROM intact. Skin: Warm and dry. Color is normal.  Neuro:  Strength and sensation are intact and no gross focal deficits noted.  Psych: Alert, appropriate and with normal affect.   LABORATORY DATA:  EKG:  EKG is not ordered today.   Lab Results  Component Value Date   GLUCOSE 95 06/26/2014   NA 138 06/26/2014   K 3.8 06/26/2014   CL 103 06/26/2014   CREATININE 0.88 06/26/2014   BUN 12 06/26/2014   CO2 26 06/26/2014   TSH 2.176 07/10/2013    BNP (last 3 results) No results for input(s): BNP in the last 8760 hours.  ProBNP (last 3 results) No results for input(s): PROBNP in the last 8760 hours.   Other Studies Reviewed Today:  Echo Study Conclusions from March 2015  - Left ventricle: The cavity size was normal. There was mild concentric hypertrophy. Systolic function was mildly to moderately reduced. The estimated ejection fraction was 40%. Wall motion was normal; there were no regional wall motion abnormalities. Doppler parameters are consistent with abnormal left ventricular relaxation (grade 1 diastolic dysfunction). Doppler parameters are consistent with elevated ventricular end-diastolic filling pressure. - Aortic valve: Trileaflet; normal thickness leaflets. No regurgitation. - Aortic root: The aortic root was normal in size. - Mitral valve: Trivial regurgitation. - Right ventricle: Systolic function was normal. - Pulmonary arteries: Systolic pressure was within the normal range.  Assessment / Plan: 1. Combined systolic and diastolic HF - EF 20 to 75% from October of 2014 - last echo from March showed improvement to 40%. Doing well on her current  regimen. Recheck baseline labs today. Cautioned her about using NSAID regularly - would use very sparingly.   2. HTN - BP looks good on current regimen.  3. Polycystic Ovarian syndrome - followed by GYN   4. Morbid obesity - this remains the crux of her issues. Discussed at length. May need to get a personal trainer to help hold her accountable. Encouraged to try Weight Watchers as well.   5. Back pain - may need to see orth - will defer to PCP   Current medicines are reviewed with the patient today.  The patient does not have concerns regarding medicines other than what has been noted above.  The following changes have been made:  See above.  Labs/ tests ordered today include:   No orders of the defined types were placed  in this encounter.     Disposition:   FU with Dr. Tamala Julian in 6 months.   Patient is agreeable to this plan and will call if any problems develop in the interim.   Signed: [name], RN, ANP-C 01/02/2015 8:43 AM  [facility] [address] [place], [place]  [address] Phone: [phone] Fax: [phone]

## 2015-01-02 NOTE — Patient Instructions (Addendum)
We will be checking the following labs today - BMET, CBC, HPF, and TSH  Medication Instructions:    Continue with your current medicines.   Use the Naproxen sparingly    Testing/Procedures To Be Arranged:  N/A  Follow-Up:   See Dr. Tamala Julian in 6 months.    Other Special Instructions:   Restrict salt  Think about what we talked about today  Call the [place] office at [phone] if you have any questions, problems or concerns.

## 2015-02-05 ENCOUNTER — Other Ambulatory Visit: Payer: Self-pay | Admitting: Interventional Cardiology

## 2015-03-27 ENCOUNTER — Other Ambulatory Visit: Payer: Self-pay | Admitting: Nurse Practitioner

## 2015-05-09 ENCOUNTER — Encounter: Payer: Self-pay | Admitting: Interventional Cardiology

## 2015-05-22 ENCOUNTER — Telehealth: Payer: Self-pay

## 2015-05-22 DIAGNOSIS — I5042 Chronic combined systolic (congestive) and diastolic (congestive) heart failure: Secondary | ICD-10-CM

## 2015-05-22 MED ORDER — POTASSIUM CHLORIDE CRYS ER 10 MEQ PO TBCR: 10.0000 meq | EXTENDED_RELEASE_TABLET | Freq: Every day | ORAL | Status: DC

## 2015-05-22 MED ORDER — FUROSEMIDE 20 MG PO TABS: 20.0000 mg | ORAL_TABLET | Freq: Every day | ORAL | Status: DC

## 2015-05-22 NOTE — Telephone Encounter (Signed)
-----   Message from Belva Crome, MD sent at 05/16/2015  4:35 PM EST ----- Regarding: Up date med list and order BMET  She has requested a change in her regimen to decrease gout attacks. I instructed her to decrease both Furosemide and K-Dur to 1/2 of their current dose. Therefore she should take furosemide 20 meq daily and K-Dur 10 meq daily.  Needs BMET in 1 week after the change.

## 2015-05-22 NOTE — Telephone Encounter (Signed)
Pt aware of Dr.Smith's recommendation below. Pt med list updated and lab appt for a bmet scheduled on 05/29/15. Pt verbalized understanding.

## 2015-05-29 ENCOUNTER — Other Ambulatory Visit (INDEPENDENT_AMBULATORY_CARE_PROVIDER_SITE_OTHER): Payer: BLUE CROSS/BLUE SHIELD | Admitting: *Deleted

## 2015-05-29 DIAGNOSIS — I5042 Chronic combined systolic (congestive) and diastolic (congestive) heart failure: Secondary | ICD-10-CM

## 2015-05-29 LAB — BASIC METABOLIC PANEL
BUN: 11 mg/dL (ref 7–25)
CO2: 20 mmol/L (ref 20–31)
Calcium: 9.1 mg/dL (ref 8.6–10.2)
Chloride: 101 mmol/L (ref 98–110)
Creat: 0.93 mg/dL (ref 0.50–1.10)
Glucose, Bld: 175 mg/dL — ABNORMAL HIGH (ref 65–99)
Potassium: 3.9 mmol/L (ref 3.5–5.3)
Sodium: 139 mmol/L (ref 135–146)

## 2015-05-29 NOTE — Addendum Note (Signed)
Addended by: Eulis Foster on: 05/29/2015 07:46 AM   Modules accepted: Orders

## 2015-06-04 ENCOUNTER — Telehealth: Payer: Self-pay

## 2015-06-04 ENCOUNTER — Encounter: Payer: Self-pay | Admitting: Interventional Cardiology

## 2015-06-04 ENCOUNTER — Telehealth: Payer: Self-pay | Admitting: Interventional Cardiology

## 2015-06-04 NOTE — Telephone Encounter (Signed)
Called to give pt lab results.lmtcb  

## 2015-06-04 NOTE — Telephone Encounter (Signed)
-----   Message from Belva Crome, MD sent at 05/31/2015 12:43 PM EST ----- Labs are okay with switch. Any symptoms of dyspnea or evidence of fluid retention? Also, any elevation is BP > 140/90 mmHg

## 2015-06-05 ENCOUNTER — Encounter: Payer: Self-pay | Admitting: Interventional Cardiology

## 2015-06-05 MED ORDER — POTASSIUM CHLORIDE CRYS ER 10 MEQ PO TBCR: 10.0000 meq | EXTENDED_RELEASE_TABLET | Freq: Every day | ORAL | Status: DC

## 2015-06-05 MED ORDER — FUROSEMIDE 20 MG PO TABS: 20.0000 mg | ORAL_TABLET | Freq: Every day | ORAL | Status: DC

## 2015-06-05 NOTE — Telephone Encounter (Signed)
Left message to call back  

## 2015-06-05 NOTE — Telephone Encounter (Signed)
Medication refilled

## 2015-06-05 NOTE — Telephone Encounter (Signed)
Patient complaining of SOB at rest, with activity, and at night when trying to lay down to go to sleep. Paitent's stated her last BP was 128/82 with a steady HR at 84. Patient states she does have some edema in her feet, but this might be gout. Patient's lasix was decreased from 40 mg to 20 mg daily, because of gout and was advised to call office if she has SOB. Will consult DOD/Flex.  Dayna Dunn PA (FLEX) recommend that patient take Lasix 40 mg and K-dur 20 meq for three days only and to follow up with Flex or APP first of next week. Patient has an appointment on Monday 06/10/15 with Truitt Merle NP for evaluation. Encouraged patient to keep a daily weight record, since she is not currently checking it. Informed patient to call office if symptoms get worse or do not improve with medication changes. Patient verbalized understanding.

## 2015-06-05 NOTE — Telephone Encounter (Signed)
New message    Patient calling    Pt C/O Shortness Of Breath: STAT if SOB developed within the last 24 hours or pt is noticeably SOB on the phone  1. Are you currently SOB (can you hear that pt is SOB on the phone)? No   2. How long have you been experiencing SOB? Medication was change recently - before christmas - heavy   3. Are you SOB when sitting or when up moving around? Both , especially laying down   4. Are you currently experiencing any other symptoms?  Calling to discuss / not feeling up to par.

## 2015-06-10 ENCOUNTER — Ambulatory Visit: Payer: BLUE CROSS/BLUE SHIELD | Admitting: Nurse Practitioner

## 2015-06-25 ENCOUNTER — Telehealth: Payer: Self-pay | Admitting: Certified Nurse Midwife

## 2015-06-25 NOTE — Telephone Encounter (Signed)
Patient states that she has been experiencing intermittent pain that is sharp on her right side. States this has begun to increase. "It hurts worse during the time that I am supposed to have my cycle. I do not have a cycle with my IUD." Had Mirena IUD placed 11/30/2013. Patient denies any current pain. States she had pain this morning. Denies any fevers, chills, bloating, or bleeding. Patient is scheduled for tomorrow 06/26/2015 at 8 am with [name] CNM for evaluation. Advised she may keep this appointment as schedule. If symptoms worsen or she develops new symptoms she will need to be seen at the [facility] Patient is agreeable.  Routing to provider for final review. Patient agreeable to disposition. Will close encounter.

## 2015-06-25 NOTE — Telephone Encounter (Signed)
Patient called is having pelvic pain, scheduled her for 06/26/15 with Ms. Debbie at 8:00. Best # to reach: [phone] (for any further questions) -No Chart

## 2015-06-26 ENCOUNTER — Encounter: Payer: Self-pay | Admitting: Certified Nurse Midwife

## 2015-06-26 ENCOUNTER — Ambulatory Visit (INDEPENDENT_AMBULATORY_CARE_PROVIDER_SITE_OTHER): Payer: BLUE CROSS/BLUE SHIELD | Admitting: Certified Nurse Midwife

## 2015-06-26 VITALS — BP 120/78 | HR 70 | Temp 98.9°F | Resp 16 | Ht 65.0 in | Wt 249.0 lb

## 2015-06-26 DIAGNOSIS — R109 Unspecified abdominal pain: Secondary | ICD-10-CM | POA: Diagnosis not present

## 2015-06-26 DIAGNOSIS — M25551 Pain in right hip: Secondary | ICD-10-CM | POA: Diagnosis not present

## 2015-06-26 DIAGNOSIS — Z113 Encounter for screening for infections with a predominantly sexual mode of transmission: Secondary | ICD-10-CM | POA: Diagnosis not present

## 2015-06-26 DIAGNOSIS — Z3040 Encounter for surveillance of contraceptives, unspecified: Secondary | ICD-10-CM

## 2015-06-26 LAB — POCT URINALYSIS DIPSTICK
Bilirubin, UA: NEGATIVE
Blood, UA: NEGATIVE
Glucose, UA: NEGATIVE
Ketones, UA: NEGATIVE
Leukocytes, UA: NEGATIVE
Nitrite, UA: NEGATIVE
Protein, UA: NEGATIVE
Urobilinogen, UA: NEGATIVE
pH, UA: 5

## 2015-06-26 LAB — CBC WITH DIFFERENTIAL/PLATELET
Basophils Absolute: 0 10*3/uL (ref 0.0–0.1)
Basophils Relative: 0 % (ref 0–1)
Eosinophils Absolute: 0 10*3/uL (ref 0.0–0.7)
Eosinophils Relative: 0 % (ref 0–5)
HCT: 40.4 % (ref 36.0–46.0)
Hemoglobin: 13 g/dL (ref 12.0–15.0)
Lymphocytes Relative: 45 % (ref 12–46)
Lymphs Abs: 3.1 10*3/uL (ref 0.7–4.0)
MCH: 28.4 pg (ref 26.0–34.0)
MCHC: 32.2 g/dL (ref 30.0–36.0)
MCV: 88.2 fL (ref 78.0–100.0)
MPV: 8.9 fL (ref 8.6–12.4)
Monocytes Absolute: 0.6 10*3/uL (ref 0.1–1.0)
Monocytes Relative: 8 % (ref 3–12)
Neutro Abs: 3.2 10*3/uL (ref 1.7–7.7)
Neutrophils Relative %: 47 % (ref 43–77)
Platelets: 311 10*3/uL (ref 150–400)
RBC: 4.58 MIL/uL (ref 3.87–5.11)
RDW: 14.9 % (ref 11.5–15.5)
WBC: 6.9 10*3/uL (ref 4.0–10.5)

## 2015-06-26 NOTE — Progress Notes (Signed)
35 y.o. Single african [place] female  G1P0010 here for complaint of pelvic pain.  Pain started 1 day ago.  Pain is primarily located RLQ and is described as sharp, reduced in intensity after occurrence, seems to occur cyclic like her period.. Previous Motrin use with relief.Did not use any medication yesterday with occurrence, not needed. Pain has seemed to resolve.  Pain is aggravated by Nexium use and is associated with other medication use.  She has tried the following treatments Motrin in past with relief. Patient also having gout flair and has pain on right side when occurs. Patient is sexually active with last sexual activity 4 days ago.   ROS:  Nausea:  No.  Fever:  No.  Weight loss/gain:  no  Vaginal discharge or odor:  Yes increase in  Other:  No UTI symptoms or vaginal itching or burning  All other ROS questions are negative except as per HPI.  Exam:   BP 120/78 mmHg  Pulse 70  Temp(Src) 98.9 F (37.2 C) (Oral)  Resp 16  Ht 5\' 5"  (1.651 m)  Wt 249 lb (112.946 kg)  BMI 41.44 kg/m2 General appearance: alert, cooperative and no distress Lungs:clear to auscultation bilaterally Abdomen:  soft, non-tender; bowel sounds normal; no masses,  no organomegaly and no rebound, point of tenderness note in right hip area,not in abdomen or pelvic area Inguinal lymph nodes not enlarged or tender  Pelvic: External genitalia:  no lesions, normal escutcheon and well estrogenized              Urethra: not indicated and normal appearing urethra with no masses, tenderness or lesions  Bladder/urethral meatus  Non tender              Bartholins and Skenes: Bartholin's, Urethra, Skene's normal                 Vagina: normal appearing vagina with normal color and discharge, no lesions, affirm taken  And specimens for GC, Chlamydia              Cervix: normal appearance and non tender,no lesions, IUD string noted in cervical os blood noted from os ? period              Pap taken: No. Bimanual Exam:   Uterus:  uterus is normal size, shape, consistency and nontender, limited palpation due to body habitus                               Adnexa:    normal adnexa in size, nontender and no large masses, or fullness noted  Limited by body habitus                               Rectovaginal: Confirms                               Anus:  defer exam  Wet prep was not obtained.     A: Normal pelvic exam limited by body habitus History of chronic right side pain with PUS 2015 with known PCOS, IUD confirmed placement at that time.  ?cyclic occurrence, but amenorrhea with Mirena IUD(due for removal 2019) STD screening Gout flair  P: Discussed pelvic exam findings and suspect period related. Patient feels this might be the case. Given calendar to record if occurs again.  Discussed Motrin use when occurs and to advise if pain is different or increasing. Patient agreeable. Reassured IUD string visualized. Warning signs of pelvic pain given. Discussed with gout history could be having some referred pain( patient also has it only on right) Will follow up with PCP regarding. Questions addressed at length.   Labs:  Gc,Chlamydia HIV,RPR,Affirm, CBC with diff  Rv prn/aex( 2/17, will follow up with status then)          An After Visit Summary was printed and given to the patient.

## 2015-06-26 NOTE — Patient Instructions (Signed)
Pelvic Pain, Female °Pelvic pain is pain felt below the belly button and between your hips. It can be caused by many different things. It is important to get help right away. This is especially true for severe, sharp, or unusual pain that comes on suddenly.  °HOME CARE °· Only take medicine as told by your doctor. °· Rest as told by your doctor. °· Eat a healthy diet, such as fruits, vegetables, and lean meats. °· Drink enough fluids to keep your pee (urine) clear or pale yellow, or as told. °· Avoid sex (intercourse) if it causes pain. °· Apply warm or cold packs to your lower belly (abdomen). Use the type of pack that helps the pain. °· Avoid situations that cause you stress. °· Keep a journal to track your pain. Write down: °¨ When the pain started. °¨ Where it is located. °¨ If there are things that seem to be related to the pain, such as food or your period. °· Follow up with your doctor as told. °GET HELP RIGHT AWAY IF:  °· You have heavy bleeding from the vagina. °· You have more pelvic pain. °· You feel lightheaded or pass out (faint). °· You have chills. °· You have pain when you pee or have blood in your pee. °· You cannot stop having watery poop (diarrhea). °· You cannot stop throwing up (vomiting). °· You have a fever or lasting symptoms for more than 3 days. °· You have a fever and your symptoms suddenly get worse. °· You are being physically or sexually abused. °· Your medicine does not help your pain. °· You have fluid (discharge) coming from your vagina that is not normal. °MAKE SURE YOU: °· Understand these instructions. °· Will watch your condition. °· Will get help if you are not doing well or get worse. °  °This information is not intended to replace advice given to you by your health care provider. Make sure you discuss any questions you have with your health care provider. °  °Document Released: 11/04/2007 Document Revised: 06/08/2014 Document Reviewed: 09/07/2011 °Elsevier Interactive Patient  Education ©2016 [employer] ° °

## 2015-06-27 ENCOUNTER — Telehealth: Payer: Self-pay

## 2015-06-27 LAB — WET PREP BY MOLECULAR PROBE
Candida species: NEGATIVE
Gardnerella vaginalis: NEGATIVE
Trichomonas vaginosis: NEGATIVE

## 2015-06-27 LAB — RPR

## 2015-06-27 LAB — HIV ANTIBODY (ROUTINE TESTING W REFLEX): HIV 1&2 Ab, 4th Generation: NONREACTIVE

## 2015-06-27 NOTE — Telephone Encounter (Signed)
Patient notified of results. See lab 

## 2015-06-27 NOTE — Telephone Encounter (Signed)
lmtcb

## 2015-06-27 NOTE — Telephone Encounter (Signed)
-----   Message from Regina Eck, CNM sent at 06/27/2015 11:05 AM EST ----- Notify patient that RPR, HIV are negative Affirm negative for BV, yeast and trichomonas CBC wit diff normal Patient status?

## 2015-06-28 LAB — IPS N GONORRHOEA AND CHLAMYDIA BY PCR

## 2015-06-30 NOTE — Progress Notes (Signed)
Reviewed personally.  M. Suzanne Eleftheria Taborn, MD.  

## 2015-07-19 ENCOUNTER — Telehealth: Payer: Self-pay | Admitting: Certified Nurse Midwife

## 2015-07-19 NOTE — Telephone Encounter (Signed)
Left patient a message to call back when ready to reschedule, canceled by automated reminder call. °

## 2015-07-23 ENCOUNTER — Ambulatory Visit: Payer: BLUE CROSS/BLUE SHIELD | Admitting: Certified Nurse Midwife

## 2015-07-24 ENCOUNTER — Encounter: Payer: Self-pay | Admitting: Certified Nurse Midwife

## 2015-07-24 ENCOUNTER — Telehealth: Payer: Self-pay | Admitting: Certified Nurse Midwife

## 2015-07-24 ENCOUNTER — Ambulatory Visit (INDEPENDENT_AMBULATORY_CARE_PROVIDER_SITE_OTHER): Payer: BLUE CROSS/BLUE SHIELD | Admitting: Certified Nurse Midwife

## 2015-07-24 VITALS — BP 120/80 | HR 80 | Resp 16 | Ht 64.24 in | Wt 247.0 lb

## 2015-07-24 DIAGNOSIS — Z0000 Encounter for general adult medical examination without abnormal findings: Secondary | ICD-10-CM

## 2015-07-24 DIAGNOSIS — Z01419 Encounter for gynecological examination (general) (routine) without abnormal findings: Secondary | ICD-10-CM

## 2015-07-24 DIAGNOSIS — Z124 Encounter for screening for malignant neoplasm of cervix: Secondary | ICD-10-CM | POA: Diagnosis not present

## 2015-07-24 LAB — POCT URINALYSIS DIPSTICK
Bilirubin, UA: NEGATIVE
Blood, UA: NEGATIVE
Glucose, UA: NEGATIVE
Ketones, UA: NEGATIVE
Leukocytes, UA: NEGATIVE
Nitrite, UA: NEGATIVE
Protein, UA: NEGATIVE
Urobilinogen, UA: NEGATIVE
pH, UA: 5

## 2015-07-24 NOTE — Telephone Encounter (Addendum)
Spoke with patient regarding benefits for recommended ultrasound. Patient states she was instructed her provider, to call our office on Monday, 07/29/15 to advise if "regimen" is working and she would be advised at that time if we would need to proceed with ultrasound. This information provided by the patient is conflicting with providers documentation. Forwarding to triage to validate if this is correct plan of action

## 2015-07-24 NOTE — Progress Notes (Signed)
35 y.o. G1P0010 Single  African American Fe here for annual exam. Periods scant to none with Mirena IUD. Desires Gc,Chlamydia screening only today. Has been under close surveillance with fluid retention due to prednisone use for use for improvement joint issues. Patient now on Lasix,with some fluid change with PCP management Hypertension with cardiology management. Has labs with both PCP and cardiology.. Cardiomyopathy unchanged per patient, continues follow up with Cardiology. She feel things are stabilizing. Aware of weight gain, but fluid only. No other health issues today.  No LMP recorded. Patient is not currently having periods (Reason: IUD).          Sexually active: Yes.    The current method of family planning is IUD.   Mirena Exercising: No.  exercise Smoker:  no  Health Maintenance: Pap:  07-16-14 neg HPV HR neg Hx of CIN1 1 MMG:  none Colonoscopy:  none BMD:   none TDaP:  2013 Shingles: no Pneumonia: no Hep C and HIV: HIV 1/17 neg Labs: poct urine-neg Self breast exam: done monthly   reports that she has never smoked. She has never used smokeless tobacco. She reports that she does not drink alcohol or use illicit drugs.  Past Medical History  Diagnosis Date  . GERD (gastroesophageal reflux disease)   . Migraine   . HTN (hypertension)   . Gout   . Systolic heart failure ([place])   . PCOS (polycystic ovarian syndrome)   . Amenorrhea   . Depression   . STD (sexually transmitted disease)     HSV (genital)  . Infertility, female     Past Surgical History  Procedure Laterality Date  . No surgical hx      Current Outpatient Prescriptions  Medication Sig Dispense Refill  . Acetaminophen (TYLENOL PO) Take by mouth as needed.    Marland Kitchen amLODipine-benazepril (LOTREL) 5-40 MG per capsule Take 1 capsule by mouth daily. 90 capsule 3  . carvedilol (COREG) 25 MG tablet TAKE 1 TABLET BY MOUTH TWICE DAILY WITH A MEAL 60 tablet 5  . cyclobenzaprine (FLEXERIL) 5 MG tablet TK 1 TO 2 TS  PO Q 8 H PRN. CAUSES DROWSINESS DO NOT OPERATE MACHINERY WHILE TAKING  1  . furosemide (LASIX) 20 MG tablet Take 1 tablet (20 mg total) by mouth daily. 30 tablet 5  . naproxen (NAPROSYN) 500 MG tablet as needed.  1  . omeprazole (PRILOSEC) 40 MG capsule as needed.  0  . potassium chloride (K-DUR,KLOR-CON) 10 MEQ tablet Take 1 tablet (10 mEq total) by mouth daily. 30 tablet 5  . allopurinol (ZYLOPRIM) 300 MG tablet as needed. Reported on 07/24/2015  1   No current facility-administered medications for this visit.    Family History  Problem Relation Age of Onset  . Hypertension Mother   . Hypertension Father   . Thyroid disease Father   . Cancer Maternal Grandmother     ovarian or colon  . Diabetes Paternal Grandfather   . Hypertension Sister     ROS:  Pertinent items are noted in HPI.  Otherwise, a comprehensive ROS was negative.  Exam:   BP 120/80 mmHg  Pulse 80  Resp 16  Ht 5' 4.24" (1.632 m)  Wt 247 lb (112.038 kg)  BMI 42.07 kg/m2 Height: 5' 4.24" (163.2 cm) Ht Readings from Last 3 Encounters:  07/24/15 5' 4.24" (1.632 m)  06/26/15 5\' 5"  (1.651 m)  01/02/15 5\' 4"  (1.626 m)    General appearance: alert, cooperative and appears stated age Head: Normocephalic,  without obvious abnormality, atraumatic Neck: no adenopathy, supple, symmetrical, trachea midline and thyroid normal to inspection and palpation Lungs: clear to auscultation bilaterally Breasts: normal appearance, no masses or tenderness, No nipple retraction or dimpling, No nipple discharge or bleeding, No axillary or supraclavicular adenopathy, large pendulous Heart: regular rate and rhythm Abdomen: soft, non-tender; no masses,  no organomegaly Extremities: extremities normal, atraumatic, no cyanosis or edema Skin: Skin color, texture, turgor normal. No rashes or lesions Lymph nodes: Cervical, supraclavicular, and axillary nodes normal. No abnormal inguinal nodes palpated Neurologic: Grossly normal   Pelvic:  External genitalia:  no lesions              Urethra:  normal appearing urethra with no masses, tenderness or lesions              Bartholin's and Skene's: normal                 Vagina: normal appearing vagina with normal color and discharge, no lesions              Cervix: no cervical motion tenderness, no lesions and normal, IUD string noted in cervical os              Pap taken: Yes.   Bimanual Exam:  Uterus:  normal, limited by body habitus, no large masses or tenderness noted              Adnexa: no mass, fullness, tenderness and adnexal not palpated due to body habitus, no large masses               Rectovaginal: Confirms               Anus:  normal sphincter tone, no lesions  Chaperone present: yes  A:  Well Woman with normal exam  Contraception Mirena IUD  STD screening  Morbid obesity  Hypertension with PCP management  Heart failure with Cardiology management  P:   Reviewed health and wellness pertinent to exam  Aware of warnings with IUD, due for removal 2020  Lab GC,Chalmydia  Discussed exercise as tolerated and portion control to help with weight loss. Discussed limited pelvic exam due to body habitus and PUS this year for evaluation. Patient agreeable. Patient will be called with insurance information and scheduled.  Continue follow up with MD as indicated  Pap smear as above with HPV reflex   counseled on breast self exam, STD prevention, HIV risk factors and prevention, adequate intake of calcium and vitamin D, diet and exercise  return annually or prn  An After Visit Summary was printed and given to the patient.

## 2015-07-24 NOTE — Patient Instructions (Signed)

## 2015-07-26 LAB — IPS N GONORRHOEA AND CHLAMYDIA BY PCR

## 2015-07-26 LAB — IPS PAP TEST WITH REFLEX TO HPV

## 2015-07-26 NOTE — Progress Notes (Signed)
Encounter reviewed Jill Jertson, MD   

## 2015-07-31 NOTE — Telephone Encounter (Signed)
Message left to return call to Meghen Akopyan at [phone].    

## 2015-08-01 ENCOUNTER — Encounter: Payer: Self-pay | Admitting: Nurse Practitioner

## 2015-08-02 ENCOUNTER — Other Ambulatory Visit: Payer: Self-pay | Admitting: Interventional Cardiology

## 2015-08-06 ENCOUNTER — Encounter: Payer: Self-pay | Admitting: Interventional Cardiology

## 2015-08-06 ENCOUNTER — Encounter: Payer: Self-pay | Admitting: Certified Nurse Midwife

## 2015-08-07 ENCOUNTER — Telehealth: Payer: Self-pay | Admitting: Emergency Medicine

## 2015-08-07 NOTE — Telephone Encounter (Signed)
Routing to [employer] CNM.  Patient has also sent this message to her cardiology office.

## 2015-08-07 NOTE — Telephone Encounter (Signed)
Chief Complaint  Patient presents with  . Advice Only    Patient sent mychart message with request for medical advice.     ===View-only below this line===   ----- Message -----    From: [name]    Sent: 08/06/2015  9:04 AM EST      To: [name], CNM Subject: Non-Urgent Medical Question  HELLO,   I HAVE BEEN TAKING GARCINIA CAMBOGIA PILLS FOR A WEEK NOW AND MY WEIGHT IS SLOWLY DECREASING. HAVE YOU HEARD ANYTHING BAD ABOUT THESE PILLS?   HERE IS THE LINK BELOW IN CASE THIS SEEMS UNFAMILIAR.   [email or web address]

## 2015-08-07 NOTE — Telephone Encounter (Signed)
Responded to patient via mychart with message from provider. Will close.

## 2015-08-07 NOTE — Telephone Encounter (Signed)
I  Have not seen any problems other than excessive fast weight loss which is usually not healthy. Suggest she consult with her cardiologist due to her cardiomyopathy.

## 2015-08-07 NOTE — Telephone Encounter (Signed)
mychart response to patient and telephone encounter created and sent to Renee Roberson CNM

## 2015-08-09 ENCOUNTER — Encounter: Payer: Self-pay | Admitting: Emergency Medicine

## 2015-08-09 NOTE — Telephone Encounter (Addendum)
-----   Message from Regina Eck, CNM sent at 08/08/2015  7:28 AM EST ----- Regarding: RE: Ultrasound It is for limited pelvic exam due to obesity. I would still recommend, but as Dr. Sabra Heck and have discussed it is her choice and She is aware of limitations with pelvic exam. ----- Message -----    From: Michele Mcalpine, RN    Sent: 08/07/2015   3:47 PM      To: Regina Eck, CNM, [facility] Subject: FW: Ultrasound                                 Routing to Provider.  Debbi can you review and advise as patient is declining to schedule Pelvic ultrasound  At this time.

## 2015-08-14 NOTE — Telephone Encounter (Signed)
Letter is sent via [place] mail to patient home address of record. Will close encounter.

## 2015-09-02 ENCOUNTER — Telehealth: Payer: Self-pay | Admitting: Certified Nurse Midwife

## 2015-09-02 NOTE — Telephone Encounter (Signed)
Patient received a letter to call and schedule an ultrasound.

## 2015-09-02 NOTE — Telephone Encounter (Signed)
Spoke with patient. Patient states that she received a letter from 08/09/2015 requesting she return call to office to schedule an ultrasound. She would like to proceed with scheduling at this time. PUS scheduled for 09/05/2015 at 4 pm with 4:15 pm consult with Dr.Jertson. She is agreeable to date and time. Patient has questions regarding her benefits. Advised I will have someone from our insurance and billing department contact her regarding these questions. She is agreeable.  Cc: [name] CNM Athalia to provider for final review. Patient agreeable to disposition. Will close encounter.

## 2015-09-03 ENCOUNTER — Telehealth: Payer: Self-pay | Admitting: Obstetrics and Gynecology

## 2015-09-03 NOTE — Telephone Encounter (Signed)
Spoke with pt regarding benefit for ultrasound. Patient understood and agreeable. Patient ready to schedule. Patient scheduled 09/05/15 with Dr Talbert Nan. Pt aware of arrival date and time. Pt aware of 72 hours cancellation policy with [id number] fee. No further questions. Ok to close

## 2015-09-05 ENCOUNTER — Ambulatory Visit (INDEPENDENT_AMBULATORY_CARE_PROVIDER_SITE_OTHER): Payer: BLUE CROSS/BLUE SHIELD

## 2015-09-05 ENCOUNTER — Ambulatory Visit (INDEPENDENT_AMBULATORY_CARE_PROVIDER_SITE_OTHER): Payer: BLUE CROSS/BLUE SHIELD | Admitting: Obstetrics and Gynecology

## 2015-09-05 ENCOUNTER — Encounter: Payer: Self-pay | Admitting: Obstetrics and Gynecology

## 2015-09-05 VITALS — BP 98/60 | HR 84 | Resp 16 | Wt 247.0 lb

## 2015-09-05 DIAGNOSIS — N8184 Pelvic muscle wasting: Secondary | ICD-10-CM | POA: Diagnosis not present

## 2015-09-05 DIAGNOSIS — R1031 Right lower quadrant pain: Secondary | ICD-10-CM

## 2015-09-05 DIAGNOSIS — R102 Pelvic and perineal pain: Secondary | ICD-10-CM | POA: Diagnosis not present

## 2015-09-05 DIAGNOSIS — M6289 Other specified disorders of muscle: Secondary | ICD-10-CM

## 2015-09-05 NOTE — Progress Notes (Signed)
GYNECOLOGY  VISIT   HPI: 35 y.o.   Single  African American  female   [id number] with No LMP recorded. Patient is not currently having periods (Reason: IUD).   here for a pelvic U/S. The patient c/o a 2 year h/o intermittent pelvic pain, always on the right. It is a throbbing aching pain, lasts for 5-10 minutes at a time, multiple times in a day. She can go weeks without the pain. The pain started the same weeks she had an endometrial biopsy and IUD insertion. On review of a calendar of the pain over the last few months the pain is random and mostly occuring every few days. She is sexually active, same partner x 3 years. Not using condoms. Just broke up, not currently sexually active. Intermittent deep dyspareunia always on right. No fevers, chills, nausea, vomiting, diarrhea or constipation. No urinary c/o. She was started on the mirena for endometrial protection. She has a h/o PCOS and amenorrhea.   GYNECOLOGIC HISTORY: No LMP recorded. Patient is not currently having periods (Reason: IUD). Contraception:IUD Menopausal hormone therapy: none         OB History    Gravida Para Term Preterm AB TAB SAB Ectopic Multiple Living   1 0 0 0 1  1   0         Patient Active Problem List   Diagnosis Date Noted  . Hypotension 08/20/2014  . Chronic combined systolic and diastolic heart failure ([place]) 11/13/2013  . Acute systolic heart failure ([place]) 03/20/2013    Class: Acute  . Accelerated essential hypertension 03/20/2013    Class: Chronic  . Morbid obesity ([place]) 03/20/2013  . Polycystic ovary syndrome 03/20/2013    Class: Chronic    Past Medical History  Diagnosis Date  . GERD (gastroesophageal reflux disease)   . Migraine   . HTN (hypertension)   . Gout   . Systolic heart failure ([place])   . PCOS (polycystic ovarian syndrome)   . Amenorrhea   . Depression   . STD (sexually transmitted disease)     HSV (genital)  . Infertility, female     Past Surgical History  Procedure  Laterality Date  . No surgical hx      Current Outpatient Prescriptions  Medication Sig Dispense Refill  . furosemide (LASIX) 40 MG tablet Take 40 mg by mouth daily.    . Acetaminophen (TYLENOL PO) Take by mouth as needed.    Marland Kitchen allopurinol (ZYLOPRIM) 300 MG tablet as needed. Reported on 07/24/2015  1  . amLODipine-benazepril (LOTREL) 5-40 MG per capsule Take 1 capsule by mouth daily. 90 capsule 3  . carvedilol (COREG) 25 MG tablet TAKE 1 TABLET BY MOUTH TWICE DAILY WITH A MEAL 60 tablet 4  . cyclobenzaprine (FLEXERIL) 5 MG tablet TK 1 TO 2 TS PO Q 8 H PRN. CAUSES DROWSINESS DO NOT OPERATE MACHINERY WHILE TAKING  1  . naproxen (NAPROSYN) 500 MG tablet as needed.  1  . omeprazole (PRILOSEC) 40 MG capsule as needed.  0  . potassium chloride (K-DUR,KLOR-CON) 10 MEQ tablet Take 1 tablet (10 mEq total) by mouth daily. 30 tablet 5   No current facility-administered medications for this visit.     ALLERGIES: Review of patient's allergies indicates no known allergies.  Family History  Problem Relation Age of Onset  . Hypertension Mother   . Hypertension Father   . Thyroid disease Father   . Cancer Maternal Grandmother     ovarian or  colon  . Diabetes Paternal Grandfather   . Hypertension Sister     Social History   Social History  . Marital Status: Single    Spouse Name: N/A  . Number of Children: N/A  . Years of Education: N/A   Occupational History  . Not on file.   Social History Main Topics  . Smoking status: Never Smoker   . Smokeless tobacco: Never Used  . Alcohol Use: No  . Drug Use: No  . Sexual Activity:    Partners: Male    Patent examiner Protection: IUD, Condom   Other Topics Concern  . Not on file   Social History Narrative    Review of Systems  Constitutional: Negative.   HENT: Negative.   Eyes: Negative.   Respiratory: Negative.   Cardiovascular: Negative.   Gastrointestinal: Negative.   Genitourinary: Negative.   Musculoskeletal: Negative.     Skin: Negative.   Neurological: Negative.   Endo/Heme/Allergies: Negative.   Psychiatric/Behavioral: Negative.     PHYSICAL EXAMINATION:    BP 98/60 mmHg  Pulse 84  Resp 16  Wt 247 lb (112.038 kg)    General appearance: alert, cooperative and appears stated age Abdomen: point tenderness in her RLQ, just above her groin, possible bulge. Just as tender with tensing of her abdominal muscles. No rebound, no guarding, no masses. No rebound or guarding.  Pelvic: External genitalia:  no lesions              Urethra:  normal appearing urethra with no masses, tenderness or lesions              Bartholins and Skenes: normal                 Vagina: normal appearing vagina with normal color and discharge, no lesions              Cervix: no lesions and IUD string 1 cm              Bimanual Exam:  Uterus:  normal size, contour, position, consistency, mobility, non-tender              Adnexa: no mass, fullness, tenderness              Bladder: not tender   Pelvic floor: mildly tender on the right pelvic floor, not on the left  Chaperone was present for exam.  ASSESSMENT RLQ abdominal pain, point tenderness just above her groin. Just as tender with tensing her abdominal muscles Normal GYN ultrasound, prominent ovaries are unchanged. IUD in place Right pelvic floor tenderness, less tender than the abdominal tenderness    PLAN Send to General Surgery to evaluate for possible right inguinal hernia If negative evaluation or if pelvic floor tenderness persists, would send to physical therapy Continue to take ibuprofen and tylenol as needed Further plan after her evaluation by General Surgery   An After Visit Summary was printed and given to the patient.  25 minutes face to face time of which over 50% was spent in counseling.

## 2015-09-06 ENCOUNTER — Telehealth: Payer: Self-pay

## 2015-09-06 NOTE — Telephone Encounter (Signed)
Spoke with patient. Patient states that since her ultrasound and pelvic exam yesterday with Dr.Jertson she has begun to have increased intermittent sharp pain on her right side. Patient is taking 800 mg of Ibuprofen q 6 hours with little relief. Reports her pain is a 8/10 today. Has not taken any medication for pain today. Last dose of Ibuprofen was taken last night with little relief.  Reports Tylenol helps better to relieve the pain, but she does not want to have to take this frequently. Denies any heavy lifting since her visit. Patient is requesting a different pain medication for her discomfort as her appointment with general surgery is scheduled for 09/16/2015. Advised I will speak with Dr.Jertson regarding her symptoms and return call with further recommendations. She is agreeable.

## 2015-09-06 NOTE — Telephone Encounter (Signed)
Spoke with patient to give information for referral appointment to [facility] for hernia. Patient understood. Patient anxious regarding pain and requested information to help her until appointment. Patient aware she may call [facility] for cancellations until her appointment 09/16/15. Patient transferred to [facility] to discuss questions.

## 2015-09-06 NOTE — Telephone Encounter (Signed)
Spoke with Dr.Jertson. Dr.Jertson recommends that the patient take 800 mg of Ibuprofen with Tylenol to help reduce her pain. Per Dr.Jertson the patient may also use a warm heating pad or take a warm bath to help to reduce her pain. If her pain persists with taking Ibuprofen and Tylenol she will need to be seen at a local ER for further evaluation of inguinal hernia.  Spoke with patient. Advised of message as seen above from [place]. She is agreeable and verbalizes understanding. Offered to call [facility] to place the patient on a cancellation list for an earlier appointment, but the patient declines. States that the appointment on 09/16/2015 is perfect as she off from work. States she will take Ibuprofen and Tylenol along with using a heating pad for pain relief. Aware she should not place the heating pad directly to the skin, should not leave it on while sleeping or if she leaves the room. She is agreeable to seek care at a local ER if pain persists or worsens.  Routing to [place] for final review.

## 2015-09-16 DIAGNOSIS — Z6841 Body Mass Index (BMI) 40.0 and over, adult: Secondary | ICD-10-CM | POA: Diagnosis not present

## 2015-09-16 DIAGNOSIS — Z975 Presence of (intrauterine) contraceptive device: Secondary | ICD-10-CM | POA: Diagnosis not present

## 2015-09-16 DIAGNOSIS — R1030 Lower abdominal pain, unspecified: Secondary | ICD-10-CM | POA: Diagnosis not present

## 2015-09-16 DIAGNOSIS — I110 Hypertensive heart disease with heart failure: Secondary | ICD-10-CM | POA: Diagnosis not present

## 2015-09-17 ENCOUNTER — Other Ambulatory Visit: Payer: Self-pay | Admitting: General Surgery

## 2015-09-17 DIAGNOSIS — R1031 Right lower quadrant pain: Secondary | ICD-10-CM

## 2015-09-23 ENCOUNTER — Ambulatory Visit
Admission: RE | Admit: 2015-09-23 | Discharge: 2015-09-23 | Disposition: A | Discharge status: Home / Self Care | Payer: BLUE CROSS/BLUE SHIELD | Source: Ambulatory Visit | Attending: General Surgery | Admitting: General Surgery

## 2015-09-23 DIAGNOSIS — R1031 Right lower quadrant pain: Secondary | ICD-10-CM

## 2015-09-23 DIAGNOSIS — R1030 Lower abdominal pain, unspecified: Secondary | ICD-10-CM | POA: Diagnosis not present

## 2015-09-23 MED ORDER — IOPAMIDOL (ISOVUE-300) INJECTION 61%
100.0000 mL | Freq: Once | INTRAVENOUS | Status: AC | PRN
  Administered 2015-09-23: 100 mL via INTRAVENOUS

## 2015-11-30 ENCOUNTER — Other Ambulatory Visit: Payer: Self-pay | Admitting: Interventional Cardiology

## 2015-12-21 DIAGNOSIS — Z91018 Allergy to other foods: Secondary | ICD-10-CM | POA: Diagnosis not present

## 2015-12-21 DIAGNOSIS — I509 Heart failure, unspecified: Secondary | ICD-10-CM | POA: Diagnosis not present

## 2015-12-21 DIAGNOSIS — Z79899 Other long term (current) drug therapy: Secondary | ICD-10-CM | POA: Diagnosis not present

## 2015-12-21 DIAGNOSIS — K5732 Diverticulitis of large intestine without perforation or abscess without bleeding: Secondary | ICD-10-CM | POA: Diagnosis not present

## 2015-12-21 DIAGNOSIS — N83201 Unspecified ovarian cyst, right side: Secondary | ICD-10-CM | POA: Diagnosis not present

## 2015-12-21 DIAGNOSIS — R1032 Left lower quadrant pain: Secondary | ICD-10-CM | POA: Diagnosis not present

## 2015-12-21 DIAGNOSIS — R109 Unspecified abdominal pain: Secondary | ICD-10-CM | POA: Diagnosis not present

## 2015-12-21 DIAGNOSIS — R1012 Left upper quadrant pain: Secondary | ICD-10-CM | POA: Diagnosis not present

## 2015-12-21 DIAGNOSIS — I10 Essential (primary) hypertension: Secondary | ICD-10-CM | POA: Diagnosis not present

## 2015-12-21 DIAGNOSIS — E7800 Pure hypercholesterolemia, unspecified: Secondary | ICD-10-CM | POA: Diagnosis not present

## 2015-12-27 ENCOUNTER — Other Ambulatory Visit: Payer: Self-pay | Admitting: Interventional Cardiology

## 2015-12-30 DIAGNOSIS — K5732 Diverticulitis of large intestine without perforation or abscess without bleeding: Secondary | ICD-10-CM

## 2015-12-30 HISTORY — DX: Diverticulitis of large intestine without perforation or abscess without bleeding: K57.32

## 2016-01-01 ENCOUNTER — Other Ambulatory Visit: Payer: Self-pay | Admitting: Interventional Cardiology

## 2016-01-03 ENCOUNTER — Other Ambulatory Visit: Payer: Self-pay | Admitting: Interventional Cardiology

## 2016-01-06 ENCOUNTER — Other Ambulatory Visit: Payer: Self-pay | Admitting: Nurse Practitioner

## 2016-01-22 ENCOUNTER — Other Ambulatory Visit: Payer: Self-pay | Admitting: Interventional Cardiology

## 2016-01-26 ENCOUNTER — Other Ambulatory Visit: Payer: Self-pay | Admitting: Interventional Cardiology

## 2016-02-25 ENCOUNTER — Ambulatory Visit: Payer: BLUE CROSS/BLUE SHIELD | Admitting: Gastroenterology

## 2016-03-05 ENCOUNTER — Encounter: Payer: Self-pay | Admitting: Obstetrics and Gynecology

## 2016-03-19 ENCOUNTER — Other Ambulatory Visit: Payer: Self-pay | Admitting: Interventional Cardiology

## 2016-03-20 NOTE — Telephone Encounter (Signed)
Lasix 40mg  QD.  Pt is overdue for follow up.  Give 30 days with reminder to get an appt. Thanks!

## 2016-03-20 NOTE — Telephone Encounter (Signed)
Please advise on what current dose should be. Thanks, MI

## 2016-03-26 ENCOUNTER — Other Ambulatory Visit: Payer: Self-pay | Admitting: Nurse Practitioner

## 2016-03-26 NOTE — Telephone Encounter (Signed)
Pharmacy requested 20 meq, but patient should be taking 10 meq qd.

## 2016-05-04 ENCOUNTER — Other Ambulatory Visit: Payer: Self-pay | Admitting: Interventional Cardiology

## 2016-05-19 ENCOUNTER — Emergency Department (HOSPITAL_COMMUNITY): Payer: BLUE CROSS/BLUE SHIELD

## 2016-05-19 ENCOUNTER — Encounter (HOSPITAL_COMMUNITY): Payer: Self-pay | Admitting: *Deleted

## 2016-05-19 DIAGNOSIS — Z7982 Long term (current) use of aspirin: Secondary | ICD-10-CM | POA: Diagnosis not present

## 2016-05-19 DIAGNOSIS — I2510 Atherosclerotic heart disease of native coronary artery without angina pectoris: Secondary | ICD-10-CM | POA: Diagnosis not present

## 2016-05-19 DIAGNOSIS — R0789 Other chest pain: Secondary | ICD-10-CM | POA: Diagnosis not present

## 2016-05-19 DIAGNOSIS — R0602 Shortness of breath: Secondary | ICD-10-CM | POA: Diagnosis not present

## 2016-05-19 DIAGNOSIS — Z79899 Other long term (current) drug therapy: Secondary | ICD-10-CM | POA: Insufficient documentation

## 2016-05-19 DIAGNOSIS — I5042 Chronic combined systolic (congestive) and diastolic (congestive) heart failure: Secondary | ICD-10-CM | POA: Diagnosis not present

## 2016-05-19 DIAGNOSIS — I110 Hypertensive heart disease with heart failure: Secondary | ICD-10-CM | POA: Insufficient documentation

## 2016-05-19 LAB — BASIC METABOLIC PANEL
Anion gap: 10 (ref 5–15)
BUN: 11 mg/dL (ref 6–20)
CO2: 25 mmol/L (ref 22–32)
Calcium: 9.1 mg/dL (ref 8.9–10.3)
Chloride: 102 mmol/L (ref 101–111)
Creatinine, Ser: 1.04 mg/dL — ABNORMAL HIGH (ref 0.44–1.00)
GFR calc Af Amer: >60 mL/min (ref >60)
GFR calc non Af Amer: >60 mL/min (ref >60)
Glucose, Bld: 119 mg/dL — ABNORMAL HIGH (ref 65–99)
Potassium: 3.7 mmol/L (ref 3.5–5.1)
Sodium: 137 mmol/L (ref 135–145)

## 2016-05-19 LAB — CBC
HCT: 39.9 % (ref 36.0–46.0)
Hemoglobin: 13.5 g/dL (ref 12.0–15.0)
MCH: 29.3 pg (ref 26.0–34.0)
MCHC: 33.8 g/dL (ref 30.0–36.0)
MCV: 86.7 fL (ref 78.0–100.0)
Platelets: 334 10*3/uL (ref 150–400)
RBC: 4.6 MIL/uL (ref 3.87–5.11)
RDW: 14.1 % (ref 11.5–15.5)
WBC: 7.1 10*3/uL (ref 4.0–10.5)

## 2016-05-19 LAB — TROPONIN I: Troponin I: <0.03 ng/mL (ref <0.03)

## 2016-05-19 NOTE — ED Triage Notes (Signed)
The pt is c/o sob and mid-chest pain all day  With some dizziness  Hx chf  lmp none iud

## 2016-05-20 ENCOUNTER — Emergency Department (HOSPITAL_COMMUNITY)
Admission: EM | Admit: 2016-05-20 | Discharge: 2016-05-20 | Disposition: A | Discharge status: Home / Self Care | Payer: BLUE CROSS/BLUE SHIELD | Attending: Emergency Medicine | Admitting: Emergency Medicine

## 2016-05-20 DIAGNOSIS — R079 Chest pain, unspecified: Secondary | ICD-10-CM

## 2016-05-20 HISTORY — DX: Atherosclerotic heart disease of native coronary artery without angina pectoris: I25.10

## 2016-05-20 HISTORY — DX: Heart failure, unspecified: I50.9

## 2016-05-20 LAB — BRAIN NATRIURETIC PEPTIDE: B Natriuretic Peptide: 16.1 pg/mL (ref 0.0–100.0)

## 2016-05-20 LAB — D-DIMER, QUANTITATIVE: D-Dimer, Quant: <0.27 ug/mL-FEU (ref 0.00–0.50)

## 2016-05-20 LAB — I-STAT TROPONIN, ED: Troponin i, poc: 0 ng/mL (ref 0.00–0.08)

## 2016-05-20 MED ORDER — KETOROLAC TROMETHAMINE 30 MG/ML IJ SOLN
30.0000 mg | Freq: Once | INTRAMUSCULAR | Status: AC
  Administered 2016-05-20: 30 mg via INTRAMUSCULAR
  Filled 2016-05-20: qty 1

## 2016-05-20 MED ORDER — NAPROXEN 500 MG PO TABS: 500.0000 mg | ORAL_TABLET | Freq: Two times a day (BID) | ORAL | 0 refills | Status: DC

## 2016-05-20 NOTE — Discharge Instructions (Signed)
You were seen today for chest pain.  Your work-up is reassuring.  Follow-up with Dr. Tamala Julian as soon as possible.

## 2016-05-20 NOTE — ED Provider Notes (Signed)
[facility] DEPT Provider Note   CSN: [id number] Arrival date & time: 05/19/16  1912  By signing my name below, I, Arianna Nassar, attest that this documentation has been prepared under the direction and in the presence of Merryl Hacker, MD.  Electronically Signed: Julien Nordmann, ED Scribe. 05/20/16. 1:14 AM.    History   Chief Complaint Chief Complaint  Patient presents with  . Chest Pain    The history is provided by the patient. No language interpreter was used.   HPI Comments: Renee Roberson is a 35 y.o. female who has a PMhx of CHF, CAD, HTN presents to the Emergency Department complaining of sudden onset, intermittent, left sided chest pain that began yesterday and became gradually worse this evening. She describes the chest pain as pressure-like and expresses that "it feels as if someone is laying on her chest". Pt rates her current a 5/10 but notes it was an 8/10 at its worst. Pt reports associated numbness in L arm radiating down to her fingers, dizziness, weakness, leg swelling interittently. She did not take any medication to relieve her symptoms. Pt expresses that she has not had similar pain like this in the past. Denies difficulty ambulating, nausea, vomiting, fever, or chills.     Past Medical History:  Diagnosis Date  . Amenorrhea   . CHF (congestive heart failure) ([place])   . Coronary artery disease   . Depression   . GERD (gastroesophageal reflux disease)   . Gout   . Hepatic steatosis   . Hepatomegaly   . HTN (hypertension)   . Infertility, female   . Migraine   . PCOS (polycystic ovarian syndrome)   . STD (sexually transmitted disease)    HSV (genital)  . Systolic heart failure [facility])     Patient Active Problem List   Diagnosis Date Noted  . Hypotension 08/20/2014  . Chronic combined systolic and diastolic heart failure ([place]) 11/13/2013  . Acute systolic heart failure ([place]) 03/20/2013    Class: Acute  . Accelerated essential hypertension  03/20/2013    Class: Chronic  . Morbid obesity ([place]) 03/20/2013  . Polycystic ovary syndrome 03/20/2013    Class: Chronic    Past Surgical History:  Procedure Laterality Date  . no surgical hx      OB History    Gravida Para Term Preterm AB Living   1 0 0 0 1 0   SAB TAB Ectopic Multiple Live Births   1               Home Medications    Prior to Admission medications   Medication Sig Start Date End Date Taking? Authorizing Provider  acetaminophen (TYLENOL) 500 MG tablet Take 500-1,000 mg by mouth every 6 (six) hours as needed (for pain).   Yes Historical Provider, MD  amLODipine-benazepril (LOTREL) 5-40 MG capsule TAKE 1 CAPSULE BY MOUTH DAILY 01/06/16  Yes [name], NP  aspirin-acetaminophen-caffeine (EXCEDRIN MIGRAINE) [phone] MG tablet Take 2 tablets by mouth every 6 (six) hours as needed for headache or migraine.   Yes Historical Provider, MD  carvedilol (COREG) 25 MG tablet TAKE 1 TABLET BY MOUTH TWICE DAILY WITH MEAL Patient taking differently: Take 25 mg by mouth two times a day with a meal 05/04/16  Yes Belva Crome, MD  furosemide (LASIX) 40 MG tablet Take 40 mg by mouth daily.   Yes Historical Provider, MD  HYDROcodone-acetaminophen (NORCO/VICODIN) 5-325 MG tablet Take 1-2 tablets by mouth every 6 (six) hours as  needed for pain. 04/30/16  Yes Historical Provider, MD  ibuprofen (ADVIL,MOTRIN) 200 MG tablet Take 800 mg by mouth every 6 (six) hours as needed (for dental pain).   Yes Historical Provider, MD  omeprazole (PRILOSEC) 40 MG capsule Take 40 mg by mouth daily as needed (for reflux).  06/09/15  Yes Historical Provider, MD  potassium chloride (K-DUR,KLOR-CON) 10 MEQ tablet Take 1 tablet (10 mEq total) by mouth daily. Patient is overdue for an appointment. Please call and schedule for further refills Patient taking differently: Take 10 mEq by mouth daily.  01/23/16  Yes Belva Crome, MD  cyclobenzaprine (FLEXERIL) 5 MG tablet TK 1 TO 2 TS PO Q 8 H PRN. CAUSES  DROWSINESS DO NOT OPERATE MACHINERY WHILE TAKING 07/13/15   Historical Provider, MD  furosemide (LASIX) 40 MG tablet TAKE 1 TABLET BY MOUTH DAILY Patient not taking: Reported on 05/20/2016 01/01/16   Belva Crome, MD  furosemide (LASIX) 40 MG tablet Take 1 tablet (40 mg total) by mouth daily. *Patient is overdue for an appointment. Please call and schedule for further refills* Patient not taking: Reported on 05/20/2016 03/20/16   Belva Crome, MD  naproxen (NAPROSYN) 500 MG tablet Take 1 tablet (500 mg total) by mouth 2 (two) times daily. 05/20/16   Merryl Hacker, MD    Family History Family History  Problem Relation Age of Onset  . Hypertension Mother   . Hypertension Father   . Thyroid disease Father   . Hypertension Sister   . Cancer Maternal Grandmother     ovarian or colon  . Diabetes Paternal Grandfather     Social History Social History  Substance Use Topics  . Smoking status: Never Smoker  . Smokeless tobacco: Never Used  . Alcohol use No     Allergies   Fish allergy   Review of Systems Review of Systems  Constitutional: Negative for chills and fever.  Cardiovascular: Positive for chest pain and leg swelling.  Gastrointestinal: Negative for diarrhea, nausea and vomiting.  Neurological: Positive for numbness.  All other systems reviewed and are negative.    Physical Exam Updated Vital Signs BP 132/74   Pulse 87   Temp 98.2 F (36.8 C) (Oral)   Resp 22   Ht 5\' 5"  (1.651 m)   Wt 238 lb (108 kg)   SpO2 98%   BMI 39.61 kg/m   Physical Exam  Constitutional: She is oriented to person, place, and time. She appears well-developed and well-nourished.  Overweight  HENT:  Head: Normocephalic and atraumatic.  Cardiovascular: Normal rate, regular rhythm and normal heart sounds.   No murmur heard. Pulmonary/Chest: Effort normal and breath sounds normal. No respiratory distress. She has no wheezes. She exhibits no tenderness.  Abdominal: Soft. Bowel sounds  are normal. There is no tenderness. There is no guarding.  Musculoskeletal:  Trace bilateral lower extremity edema  Neurological: She is alert and oriented to person, place, and time.  Skin: Skin is warm and dry.  Psychiatric: She has a normal mood and affect.  Nursing note and vitals reviewed.    ED Treatments / Results  DIAGNOSTIC STUDIES: Oxygen Saturation is 93% on RA, low by my interpretation.  COORDINATION OF CARE:  1:13 AM Discussed treatment plan with pt at bedside and pt agreed to plan.  Labs (all labs ordered are listed, but only abnormal results are displayed) Labs Reviewed  BASIC METABOLIC PANEL - Abnormal; Notable for the following:       Result Value  Glucose, Bld 119 (*)    Creatinine, Ser 1.04 (*)    All other components within normal limits  CBC  TROPONIN I  D-DIMER, QUANTITATIVE (NOT AT [facility])  BRAIN NATRIURETIC PEPTIDE  I-STAT TROPOININ, ED    EKG  EKG Interpretation  Date/Time:  Tuesday May 19 2016 19:15:01 EST Ventricular Rate:  85 PR Interval:  150 QRS Duration: 66 QT Interval:  376 QTC Calculation: 447 R Axis:   32 Text Interpretation:  Normal sinus rhythm Cannot rule out Anterior infarct , age undetermined Abnormal ECG Confirmed by Dina Rich  MD, COURTNEY ([id number]) on 05/20/2016 12:49:02 AM       Radiology Dg Chest 2 View  Result Date: 05/19/2016 CLINICAL DATA:  35 year old female with shortness of breath and dizziness. EXAM: CHEST  2 VIEW COMPARISON:  Chest radiograph dated 01/30/2013 FINDINGS: The heart size and mediastinal contours are within normal limits. Both lungs are clear. The visualized skeletal structures are unremarkable. IMPRESSION: No active cardiopulmonary disease. Electronically Signed   By: Anner Crete M.D.   On: 05/19/2016 21:34    Procedures Procedures (including critical care time)  Medications Ordered in ED Medications  ketorolac (TORADOL) 30 MG/ML injection 30 mg (30 mg Intramuscular Given 05/20/16 0131)      Initial Impression / Assessment and Plan / ED Course  I have reviewed the triage vital signs and the nursing notes.  Pertinent labs & imaging results that were available during my care of the patient were reviewed by me and considered in my medical decision making (see chart for details).  Clinical Course     She presents with chest pain. Ongoing for over 24 hours. History of CHF. No history of coronary artery disease. Some features suggest musculoskeletal; however, patient does have risk factors. EKG is nonischemic. Troponin 2 negative. BNP reassuring. D-dimer negative. Patient was given Toradol with relief of her symptoms. Given her history would have her follow-up closely with cardiology. Naproxen twice a day for pain.  After history, exam, and medical workup I feel the patient has been appropriately medically screened and is safe for discharge home. Pertinent diagnoses were discussed with the patient. Patient was given return precautions.   Final Clinical Impressions(s) / ED Diagnoses   Final diagnoses:  Chest pain, unspecified type   I personally performed the services described in this documentation, which was scribed in my presence. The recorded information has been reviewed and is accurate.   New Prescriptions New Prescriptions   NAPROXEN (NAPROSYN) 500 MG TABLET    Take 1 tablet (500 mg total) by mouth 2 (two) times daily.     Merryl Hacker, MD 05/20/16 [phone]

## 2016-05-26 ENCOUNTER — Ambulatory Visit: Payer: BLUE CROSS/BLUE SHIELD | Admitting: Physician Assistant

## 2016-05-29 ENCOUNTER — Encounter: Payer: Self-pay | Admitting: Physician Assistant

## 2016-05-29 ENCOUNTER — Encounter: Payer: Self-pay | Admitting: Interventional Cardiology

## 2016-05-29 ENCOUNTER — Ambulatory Visit (INDEPENDENT_AMBULATORY_CARE_PROVIDER_SITE_OTHER): Payer: BLUE CROSS/BLUE SHIELD | Admitting: Physician Assistant

## 2016-05-29 VITALS — BP 132/90 | HR 72 | Ht 65.0 in | Wt 247.0 lb

## 2016-05-29 DIAGNOSIS — I10 Essential (primary) hypertension: Secondary | ICD-10-CM

## 2016-05-29 DIAGNOSIS — R079 Chest pain, unspecified: Secondary | ICD-10-CM

## 2016-05-29 DIAGNOSIS — I5042 Chronic combined systolic (congestive) and diastolic (congestive) heart failure: Secondary | ICD-10-CM

## 2016-05-29 MED ORDER — CARVEDILOL 25 MG PO TABS: ORAL_TABLET | ORAL | 3 refills | Status: DC

## 2016-05-29 MED ORDER — FUROSEMIDE 40 MG PO TABS: 40.0000 mg | ORAL_TABLET | Freq: Every day | ORAL | 3 refills | Status: DC

## 2016-05-29 MED ORDER — AMLODIPINE BESY-BENAZEPRIL HCL 5-40 MG PO CAPS: 1.0000 | ORAL_CAPSULE | Freq: Every day | ORAL | 3 refills | Status: DC

## 2016-05-29 MED ORDER — POTASSIUM CHLORIDE CRYS ER 10 MEQ PO TBCR: 10.0000 meq | EXTENDED_RELEASE_TABLET | Freq: Every day | ORAL | 3 refills | Status: DC

## 2016-05-29 NOTE — Patient Instructions (Addendum)
Your physician has requested that you have an exercise tolerance test. For further information please visit [email or web address]. Please also follow instruction sheet, as given.  Your physician recommends that you schedule a follow-up appointment as scheduled with Dr. Tamala Julian.

## 2016-05-29 NOTE — Progress Notes (Signed)
Cardiology Office Note    Date:  05/29/2016   ID:  Renee Roberson, DOB [birth year], MRN [id number]  PCP:  Vena Austria, MD  Cardiologist:  Dr. Tamala Julian / Truitt Merle  Chief Complaint  Patient presents with  . Follow-up    seen for Dr. Tamala Julian, eval for chest pain    History of Present Illness:  Renee Roberson is a 35 y.o. female with PMH of HTN, PCOS and chronic combined systolic and diastolic HF. Her previous ejection fraction was 25% in September 2014, and this has improved to 40-45% by March 2015 once her blood pressure control was improved. She was seen twice in 2016, in March 2016, she was hypotensive, her spironolactone was stopped. On follow-up on 01/02/2015, she was doing well at the time. Weight loss was encouraged. She was instructed to follow-up with Dr. Tamala Julian in 6 month, this did not appears to have happened. She was seen in the ED on 05/20/2016 for evaluation of sudden onset of intermittent left-sided chest pain. Her d-dimer was negative, troponin was negative 2. BNP was only 16.1. EKG showed poor R wave progression in the anterior lead, otherwise no significant ST-T wave changes.  She says since her most recent ED visit, she continued to have intermittent chest discomfort however not as severe. It is associated with exertion, however she also mentions the left-sided chest pain under her breast is also worsened by deep inspiration, body rotation and palpation. She says she notices it more when she sleeps on the left side, the symptom improved when she rolled over to the right side. Suspicion for coronary artery disease is quite low in this case given her young age and also the atypical presentation of her symptom. I will repeat a treadmill tolerance test, if negative, she can follow-up with Dr. Tamala Julian in a few month.    Past Medical History:  Diagnosis Date  . Amenorrhea   . CHF (congestive heart failure) ([place])   . Coronary artery disease   . Depression   . GERD  (gastroesophageal reflux disease)   . Gout   . Hepatic steatosis   . Hepatomegaly   . HTN (hypertension)   . Infertility, female   . Migraine   . PCOS (polycystic ovarian syndrome)   . STD (sexually transmitted disease)    HSV (genital)  . Systolic heart failure [facility])     Past Surgical History:  Procedure Laterality Date  . no surgical hx      Current Medications: Outpatient Medications Prior to Visit  Medication Sig Dispense Refill  . acetaminophen (TYLENOL) 500 MG tablet Take 500-1,000 mg by mouth every 6 (six) hours as needed (for pain).    Marland Kitchen aspirin-acetaminophen-caffeine (EXCEDRIN MIGRAINE) 250-250-65 MG tablet Take 2 tablets by mouth every 6 (six) hours as needed for headache or migraine.    . naproxen (NAPROSYN) 500 MG tablet Take 1 tablet (500 mg total) by mouth 2 (two) times daily. 30 tablet 0  . omeprazole (PRILOSEC) 40 MG capsule Take 40 mg by mouth daily as needed (for reflux).   0  . amLODipine-benazepril (LOTREL) 5-40 MG capsule TAKE 1 CAPSULE BY MOUTH DAILY 30 capsule 0  . carvedilol (COREG) 25 MG tablet TAKE 1 TABLET BY MOUTH TWICE DAILY WITH MEAL (Patient taking differently: Take 25 mg by mouth two times a day with a meal) 60 tablet 3  . furosemide (LASIX) 40 MG tablet Take 40 mg by mouth daily.    . potassium chloride (K-DUR,KLOR-CON) 10 MEQ  tablet Take 1 tablet (10 mEq total) by mouth daily. Patient is overdue for an appointment. Please call and schedule for further refills (Patient taking differently: Take 10 mEq by mouth daily. ) 15 tablet 0  . cyclobenzaprine (FLEXERIL) 5 MG tablet TK 1 TO 2 TS PO Q 8 H PRN. CAUSES DROWSINESS DO NOT OPERATE MACHINERY WHILE TAKING  1  . furosemide (LASIX) 40 MG tablet TAKE 1 TABLET BY MOUTH DAILY (Patient not taking: Reported on 05/20/2016) 30 tablet 0  . furosemide (LASIX) 40 MG tablet Take 1 tablet (40 mg total) by mouth daily. *Patient is overdue for an appointment. Please call and schedule for further refills* (Patient not  taking: Reported on 05/20/2016) 30 tablet 0  . HYDROcodone-acetaminophen (NORCO/VICODIN) 5-325 MG tablet Take 1-2 tablets by mouth every 6 (six) hours as needed for pain.  0  . ibuprofen (ADVIL,MOTRIN) 200 MG tablet Take 800 mg by mouth every 6 (six) hours as needed (for dental pain).     No facility-administered medications prior to visit.      Allergies:   Fish allergy   Social History   Social History  . Marital status: Single    Spouse name: N/A  . Number of children: N/A  . Years of education: N/A   Social History Main Topics  . Smoking status: Never Smoker  . Smokeless tobacco: Never Used  . Alcohol use No  . Drug use: No  . Sexual activity: Yes    Partners: Male    Birth control/ protection: IUD, Condom   Other Topics Concern  . None   Social History Narrative  . None     Family History:  The patient's family history includes Cancer in her maternal grandmother; Diabetes in her paternal grandfather; Hypertension in her father, mother, and sister; Thyroid disease in her father.   ROS:   Please see the history of present illness.    ROS All other systems reviewed and are negative.   PHYSICAL EXAM:   VS:  BP 132/90   Pulse 72   Ht 5\' 5"  (1.651 m)   Wt 247 lb (112 kg)   LMP  (LMP Unknown)   BMI 41.10 kg/m    GEN: Well nourished, well developed, in no acute distress  HEENT: normal  Neck: no JVD, carotid bruits, or masses Cardiac: RRR; no murmurs, rubs, or gallops,no edema  Respiratory:  clear to auscultation bilaterally, normal work of breathing GI: soft, nontender, nondistended, + BS MS: no deformity or atrophy  Skin: warm and dry, no rash Neuro:  Alert and Oriented x 3, Strength and sensation are intact Psych: euthymic mood, full affect  Wt Readings from Last 3 Encounters:  05/29/16 247 lb (112 kg)  05/19/16 238 lb (108 kg)  09/05/15 247 lb (112 kg)      Studies/Labs Reviewed:   EKG:  EKG is not ordered today.   Recent Labs: 05/19/2016: B  Natriuretic Peptide 16.1; BUN 11; Creatinine, Ser 1.04; Hemoglobin 13.5; Platelets 334; Potassium 3.7; Sodium 137   Lipid Panel No results found for: CHOL, TRIG, HDL, CHOLHDL, VLDL, LDLCALC, LDLDIRECT  Additional studies/ records that were reviewed today include:   Echo 08/29/2013 LV EF: 40%    LV EF: 40% -  45%  - Left ventricle: The cavity size was normal. There was mild concentric hypertrophy. Systolic function was mildly to moderately reduced. The estimated ejection fraction was 40%. Wall motion was normal; there were no regional wall motion abnormalities. Doppler parameters are consistent with  abnormal left ventricular relaxation (grade 1 diastolic dysfunction). Doppler parameters are consistent with elevated ventricular end-diastolic filling pressure. - Aortic valve: Trileaflet; normal thickness leaflets. No regurgitation. - Aortic root: The aortic root was normal in size. - Mitral valve: Trivial regurgitation. - Right ventricle: Systolic function was normal. - Pulmonary arteries: Systolic pressure was within the normal range.   ETT 07/03/2014 ETT Interpretation:  normal - no evidence of ischemia by ST analysis  Comments: Patient presents today for routine GXT. Recent visit she endorsed some atypical chest pain. She has chronic systolic and diastolic HF - from poorly controlled HTN.  Other issues include obesity and polycystic ovarian syndrome. Echo from September of 2014 showed EF of 20 to 25% with grade III diastolic HF but improved on her last echo - EF was improving to 40%.  Today the patient exercised on the standard Bruce protocol for a total of 7 minutes.  Fair exercise tolerance.  Adequate blood pressure response.  Clinically negative for chest pain. Test was stopped due to fatigue and achievement of target HR.  EKG negative for ischemia. No significant arrhythmia noted but had occasional PVC in early stages of exercise.     ASSESSMENT:      1. Exertional chest pain   2. Essential hypertension   3. Chronic combined systolic and diastolic heart failure (HCC)      PLAN:  In order of problems listed above:  1. Exertional chest pain: Dispite the exertional nature of her chest discomfort, it is quite atypical, worse with deep inspiration, body rotation and palpation. Also worse when she is laying on that side. Suspicion for coronary artery disease is quite low. Plan to repeat exercise tolerance test as this time. If negative, no further workup is needed. I have instructed her to take naproxen as needed for the pain. It is very likely to be musculoskeletal in nature.  2. Hypertension: Well-controlled on carvedilol and amlodipine/benazepril  3. Chronic combined systolic and diastolic heart failure: No sign of heart failure on physical exam, recent BNP 16.1 on 05/19/2016.    Medication Adjustments/Labs and Tests Ordered: Current medicines are reviewed at length with the patient today.  Concerns regarding medicines are outlined above.  Medication changes, Labs and Tests ordered today are listed in the Patient Instructions below. Patient Instructions  Your physician has requested that you have an exercise tolerance test. For further information please visit [email or web address]. Please also follow instruction sheet, as given.  Your physician recommends that you schedule a follow-up appointment as scheduled with Dr. Tamala Julian.     Renee Roberson, [place]  05/29/2016 9:34 PM    [facility] [place], [place], [place]  [address] Phone: [phone]; Fax: [phone]

## 2016-06-23 ENCOUNTER — Telehealth (HOSPITAL_COMMUNITY): Payer: Self-pay

## 2016-06-23 NOTE — Telephone Encounter (Signed)
Encounter complete. 

## 2016-06-25 ENCOUNTER — Inpatient Hospital Stay (HOSPITAL_COMMUNITY): Admission: RE | Admit: 2016-06-25 | Payer: BLUE CROSS/BLUE SHIELD | Source: Ambulatory Visit

## 2016-08-06 NOTE — Progress Notes (Signed)
36 y.o. G1P0010 Single  African American Fe here for annual exam. Periods none, some cramping when due, no spotting now. Tries to feel string. No concerns. Working on diet for weight loss and plans to stay with it. Sees cardiology once yearly for medication management. Was seen in ER for chest pain, all areas normal. Sees PCP Dierdre Forth for hypertension, GERD and cholesterol management/labs/aex.Patient has noted changed area in left breast, no nipple discharge, occasionally tender. No injury o trauma  No LMP recorded. Patient is not currently having periods (Reason: IUD).          Sexually active: Yes.    The current method of family planning is IUD & condoms  Exercising: Yes.    walking, zumba Smoker:  no  Health Maintenance: Pap:  07-16-14 neg HPV HR neg, 07-24-15 neg MMG:  none Colonoscopy:  none BMD:   none TDaP:  2013 Shingles: no Pneumonia: no Hep C and HIV: HIV neg 2017 Labs: none Self breast exam: done occ   reports that she has never smoked. She has never used smokeless tobacco. She reports that she does not drink alcohol or use drugs.  Past Medical History:  Diagnosis Date  . Amenorrhea   . CHF (congestive heart failure) ([place])   . Coronary artery disease   . Depression   . GERD (gastroesophageal reflux disease)   . Gout   . Hepatic steatosis   . Hepatomegaly   . HTN (hypertension)   . Infertility, female   . Migraine   . PCOS (polycystic ovarian syndrome)   . STD (sexually transmitted disease)    HSV (genital)  . Systolic heart failure [facility])     Past Surgical History:  Procedure Laterality Date  . no surgical hx      Current Outpatient Prescriptions  Medication Sig Dispense Refill  . amLODipine-benazepril (LOTREL) 5-40 MG capsule Take 1 capsule by mouth daily. 90 capsule 3  . aspirin-acetaminophen-caffeine (EXCEDRIN MIGRAINE) 250-250-65 MG tablet Take 2 tablets by mouth every 6 (six) hours as needed for headache or migraine.    . carvedilol (COREG) 25 MG  tablet TAKE 1 TABLET BY MOUTH TWICE DAILY WITH MEAL 180 tablet 3  . furosemide (LASIX) 40 MG tablet Take 1 tablet (40 mg total) by mouth daily. 90 tablet 3  . omeprazole (PRILOSEC) 40 MG capsule Take 40 mg by mouth daily as needed (for reflux).   0  . potassium chloride (K-DUR,KLOR-CON) 10 MEQ tablet Take 1 tablet (10 mEq total) by mouth daily. 90 tablet 3   No current facility-administered medications for this visit.     Family History  Problem Relation Age of Onset  . Hypertension Mother   . Hypertension Father   . Thyroid disease Father   . Hypertension Sister   . Cancer Maternal Grandmother     ovarian or colon  . Diabetes Paternal Grandfather     ROS:  Pertinent items are noted in HPI.  Otherwise, a comprehensive ROS was negative.  Exam:   BP 120/82   Pulse 68   Resp 16   Ht 5' 4.75" (1.645 m)   Wt 246 lb (111.6 kg)   BMI 41.25 kg/m  Height: 5' 4.75" (164.5 cm) Ht Readings from Last 3 Encounters:  08/07/16 5' 4.75" (1.645 m)  05/29/16 5\' 5"  (1.651 m)  05/19/16 5\' 5"  (1.651 m)    General appearance: alert, cooperative and appears stated age Head: Normocephalic, without obvious abnormality, atraumatic Neck: no adenopathy, supple, symmetrical, trachea midline and  thyroid normal to inspection and palpation Lungs: clear to auscultation bilaterally Breasts: normal appearance, no masses or tenderness, No nipple retraction or dimpling, No nipple discharge or bleeding, No axillary or supraclavicular adenopathy Left breast at posterior edge small cystic feel masses 3 felt, slightly tender, no redness Heart: regular rate and rhythm Abdomen: soft, non-tender; no masses,  no organomegaly Extremities: extremities normal, atraumatic, no cyanosis or edema Skin: Skin color, texture, turgor normal. No rashes or lesions Lymph nodes: Cervical, supraclavicular, and axillary nodes normal. No abnormal inguinal nodes palpated Neurologic: Grossly normal   Pelvic: External genitalia:  no  lesions              Urethra:  normal appearing urethra with no masses, tenderness or lesions              Bartholin's and Skene's: normal                 Vagina: normal appearing vagina with normal color and discharge, no lesions              Cervix: multiparous appearance, no cervical motion tenderness and no lesions  IUD string noted in cervical os              Pap taken: Yes.   Bimanual Exam:  Uterus:  normal size, contour, position, consistency, mobility, non-tender              Adnexa: normal adnexa and no mass, fullness, tenderness               Rectovaginal: Confirms               Anus:  normal sphincter tone, no lesions  Chaperone present: yes  A:  Well Woman with normal exam  Contraception Mirena IUD  Left breast ?cysts R/O masses  PCOS history  Morbid obesity on weight loss  CHF under cardiology management  Hypertension, GERD, cholesterol management with PCP  Screening labs       P:   Reviewed health and wellness pertinent to exam  Reviewed warning signs with Mirena IUD and need to advise  Discussed finding in breast and need for evaluation. Patient agreeable and will be scheduled for diagnostic mammogram and [place] prior to leaving  Continue follow up with MD as indicated  Labs:GC,Chlamydia, HIV,RPR, Hep B,C  Pap smear as above not taken   counseled on breast self exam, mammography screening, STD prevention, HIV risk factors and prevention, adequate intake of calcium and vitamin D, diet and exercise  return annually or prn  An After Visit Summary was printed and given to the patient.

## 2016-08-07 ENCOUNTER — Ambulatory Visit (INDEPENDENT_AMBULATORY_CARE_PROVIDER_SITE_OTHER): Payer: BLUE CROSS/BLUE SHIELD | Admitting: Certified Nurse Midwife

## 2016-08-07 ENCOUNTER — Encounter: Payer: Self-pay | Admitting: Certified Nurse Midwife

## 2016-08-07 VITALS — BP 120/82 | HR 68 | Resp 16 | Ht 64.75 in | Wt 246.0 lb

## 2016-08-07 DIAGNOSIS — N6012 Diffuse cystic mastopathy of left breast: Secondary | ICD-10-CM | POA: Diagnosis not present

## 2016-08-07 DIAGNOSIS — Z0000 Encounter for general adult medical examination without abnormal findings: Secondary | ICD-10-CM

## 2016-08-07 DIAGNOSIS — Z01419 Encounter for gynecological examination (general) (routine) without abnormal findings: Secondary | ICD-10-CM

## 2016-08-07 NOTE — Patient Instructions (Signed)

## 2016-08-07 NOTE — Progress Notes (Signed)
Scheduled patient while in office for bilateral diagnostic mammogram [facility]17[facility]2016 at 1:20 pm. Patient is agreeable to date and time. Placed in mammogram hold.

## 2016-08-08 LAB — WET PREP BY MOLECULAR PROBE
Candida species: NOT DETECTED
Gardnerella vaginalis: DETECTED — AB
Trichomonas vaginosis: NOT DETECTED

## 2016-08-08 LAB — HEPATITIS C ANTIBODY: HCV Ab: NEGATIVE

## 2016-08-08 LAB — STD PANEL
HIV 1&2 Ab, 4th Generation: NONREACTIVE
Hepatitis B Surface Ag: NEGATIVE

## 2016-08-10 ENCOUNTER — Ambulatory Visit: Payer: BLUE CROSS/BLUE SHIELD | Admitting: Interventional Cardiology

## 2016-08-10 LAB — GC/CHLAMYDIA PROBE AMP
CT Probe RNA: NOT DETECTED
GC Probe RNA: NOT DETECTED

## 2016-08-11 ENCOUNTER — Other Ambulatory Visit: Payer: Self-pay

## 2016-08-11 MED ORDER — METRONIDAZOLE 500 MG PO TABS: 500.0000 mg | ORAL_TABLET | Freq: Two times a day (BID) | ORAL | 0 refills | Status: DC

## 2016-08-16 NOTE — Progress Notes (Signed)
Encounter reviewed Aulden Calise, MD   

## 2016-08-16 NOTE — Progress Notes (Addendum)
Cardiology Office Note    Date:  08/17/2016   ID:  Renee Roberson, DOB 1980-09-16, MRN [id number]  PCP:  Vena Austria, MD  Cardiologist: [name], MD   Chief Complaint  Patient presents with  . Congestive Heart Failure    History of Present Illness:  Renee Roberson is a 36 y.o. female with PMH of HTN, PCOS, chest pain,  and chronic combined systolic and diastolic HF. Her previous ejection fraction was 25% in September 2014, and this has improved to 40-45% by March 2015 with blood pressure control .  Overall, Renee Roberson is doing relatively well. She has an idiopathic cardiomyopathy. She is on heart failure therapy. She feels tired all the time. She snores.. She denies lower extremity edema. No chest pain. No episodes of syncope.   Past Medical History:  Diagnosis Date  . Amenorrhea   . CHF (congestive heart failure) ([place])   . Coronary artery disease   . Depression   . GERD (gastroesophageal reflux disease)   . Gout   . Hepatic steatosis   . Hepatomegaly   . HTN (hypertension)   . Infertility, female   . Migraine   . PCOS (polycystic ovarian syndrome)   . STD (sexually transmitted disease)    HSV (genital)  . Systolic heart failure [facility])     Past Surgical History:  Procedure Laterality Date  . no surgical hx      Current Medications: Outpatient Medications Prior to Visit  Medication Sig Dispense Refill  . amLODipine-benazepril (LOTREL) 5-40 MG capsule Take 1 capsule by mouth daily. 90 capsule 3  . aspirin-acetaminophen-caffeine (EXCEDRIN MIGRAINE) 250-250-65 MG tablet Take 1 tablet by mouth every 6 (six) hours as needed for headache or migraine.     . carvedilol (COREG) 25 MG tablet TAKE 1 TABLET BY MOUTH TWICE DAILY WITH MEAL 180 tablet 3  . furosemide (LASIX) 40 MG tablet Take 1 tablet (40 mg total) by mouth daily. 90 tablet 3  . metroNIDAZOLE (FLAGYL) 500 MG tablet Take 1 tablet (500 mg total) by mouth 2 (two) times daily. 14 tablet 0  .  omeprazole (PRILOSEC) 40 MG capsule Take 40 mg by mouth daily as needed (for reflux).   0  . potassium chloride (K-DUR,KLOR-CON) 10 MEQ tablet Take 1 tablet (10 mEq total) by mouth daily. 90 tablet 3   No facility-administered medications prior to visit.      Allergies:   Fish allergy   Social History   Social History  . Marital status: Single    Spouse name: N/A  . Number of children: N/A  . Years of education: N/A   Social History Main Topics  . Smoking status: Never Smoker  . Smokeless tobacco: Never Used  . Alcohol use No  . Drug use: No  . Sexual activity: Yes    Partners: Male    Birth control/ protection: IUD, Condom   Other Topics Concern  . None   Social History Narrative  . None     Family History:  The patient's family history includes Cancer in her maternal grandmother; Diabetes in her paternal grandfather; Hypertension in her father, mother, and sister; Thyroid disease in her father.   ROS:   Please see the history of present illness. No other complaints other than occasional chest pain, snoring, muscle pain, and back pain. Also has occasional dizziness.  All other systems reviewed and are negative.   PHYSICAL EXAM:   VS:  BP 124/72   Pulse  82   Ht 5' 4.75" (1.645 m)   Wt 245 lb (111.1 kg)   SpO2 97%   BMI 41.09 kg/m    GEN: Well nourished, well developed, in no acute distress . Marked obesity HEENT: normal  Neck: no JVD, carotid bruits, or masses Cardiac: RRR; no murmurs, rubs, or gallops,no edema  Respiratory:  clear to auscultation bilaterally, normal work of breathing GI: soft, nontender, nondistended, + BS MS: no deformity or atrophy  Skin: warm and dry, no rash Neuro:  Alert and Oriented x 3, Strength and sensation are intact Psych: euthymic mood, full affect  Wt Readings from Last 3 Encounters:  08/17/16 245 lb (111.1 kg)  08/07/16 246 lb (111.6 kg)  05/29/16 247 lb (112 kg)      Studies/Labs Reviewed:   EKG:  EKG  Not  repeated  Recent Labs: 05/19/2016: B Natriuretic Peptide 16.1; BUN 11; Creatinine, Ser 1.04; Hemoglobin 13.5; Platelets 334; Potassium 3.7; Sodium 137   Lipid Panel No results found for: CHOL, TRIG, HDL, CHOLHDL, VLDL, LDLCALC, LDLDIRECT  Additional studies/ records that were reviewed today include:  None    ASSESSMENT:    1. Chronic combined systolic and diastolic heart failure ([place])   2. Morbid obesity ([place])   3. Snoring   4. Daytime sleepiness      PLAN:  In order of problems listed above:  1. Continue current heart failure therapy. Repeat 2-D Doppler echo at some point this year to document stable LV function. Six-month clinical follow-up with me. 2. Encouraged aerobic activity and weight loss. 3. She needs to have a sleep study to rule out sleep apnea. This could be aggravating her underlying nonischemic cardiomyopathy.  Encouraged aerobic activity to help with weight loss, sleep study to rule out OSA, an echo later this year to reassess LV function. Clinical follow-up in 6 months. Earlier if problems noted.  Medication Adjustments/Labs and Tests Ordered: Current medicines are reviewed at length with the patient today.  Concerns regarding medicines are outlined above.  Medication changes, Labs and Tests ordered today are listed in the Patient Instructions below. Patient Instructions  Medication Instructions:  None  Labwork: None  Testing/Procedures: Your physician has requested that you have an echocardiogram anytime before your 6 month follow up with Dr.Smith. Echocardiography is a painless test that uses sound waves to create images of your heart. It provides your doctor with information about the size and shape of your heart and how well your heart's chambers and valves are working. This procedure takes approximately one hour. There are no restrictions for this procedure.   Your physician has recommended that you have a sleep study. This test records several body  functions during sleep, including: brain activity, eye movement, oxygen and carbon dioxide blood levels, heart rate and rhythm, breathing rate and rhythm, the flow of air through your mouth and nose, snoring, body muscle movements, and chest and belly movement.   Follow-Up: Your physician wants you to follow-up in: 6 months with Dr. Tamala Julian.  You will receive a reminder letter in the mail two months in advance. If you don't receive a letter, please call our office to schedule the follow-up appointment.   Any Other Special Instructions Will Be Listed Below (If Applicable).     If you need a refill on your cardiac medications before your next appointment, please call your pharmacy.      Signed, [name], MD  08/17/2016 5:03 PM    [facility]  [address] [place], [place]  [address] Phone: [phone]; Fax: [phone]

## 2016-08-17 ENCOUNTER — Ambulatory Visit (INDEPENDENT_AMBULATORY_CARE_PROVIDER_SITE_OTHER): Payer: BLUE CROSS/BLUE SHIELD | Admitting: Interventional Cardiology

## 2016-08-17 ENCOUNTER — Ambulatory Visit
Admission: RE | Admit: 2016-08-17 | Discharge: 2016-08-17 | Disposition: A | Discharge status: Home / Self Care | Payer: BLUE CROSS/BLUE SHIELD | Source: Ambulatory Visit | Attending: Certified Nurse Midwife | Admitting: Certified Nurse Midwife

## 2016-08-17 ENCOUNTER — Encounter: Payer: Self-pay | Admitting: Interventional Cardiology

## 2016-08-17 VITALS — BP 124/72 | HR 82 | Ht 64.75 in | Wt 245.0 lb

## 2016-08-17 DIAGNOSIS — N6012 Diffuse cystic mastopathy of left breast: Secondary | ICD-10-CM

## 2016-08-17 DIAGNOSIS — R400 Somnolence: Secondary | ICD-10-CM

## 2016-08-17 DIAGNOSIS — R0683 Snoring: Secondary | ICD-10-CM | POA: Diagnosis not present

## 2016-08-17 DIAGNOSIS — R928 Other abnormal and inconclusive findings on diagnostic imaging of breast: Secondary | ICD-10-CM | POA: Diagnosis not present

## 2016-08-17 DIAGNOSIS — N6489 Other specified disorders of breast: Secondary | ICD-10-CM | POA: Diagnosis not present

## 2016-08-17 DIAGNOSIS — I5042 Chronic combined systolic (congestive) and diastolic (congestive) heart failure: Secondary | ICD-10-CM

## 2016-08-17 HISTORY — DX: Snoring: R06.83

## 2016-08-17 HISTORY — DX: Somnolence: R40.0

## 2016-08-17 NOTE — Patient Instructions (Signed)
Medication Instructions:  None  Labwork: None  Testing/Procedures: Your physician has requested that you have an echocardiogram anytime before your 6 month follow up with Dr.Smith. Echocardiography is a painless test that uses sound waves to create images of your heart. It provides your doctor with information about the size and shape of your heart and how well your heart's chambers and valves are working. This procedure takes approximately one hour. There are no restrictions for this procedure.   Your physician has recommended that you have a sleep study. This test records several body functions during sleep, including: brain activity, eye movement, oxygen and carbon dioxide blood levels, heart rate and rhythm, breathing rate and rhythm, the flow of air through your mouth and nose, snoring, body muscle movements, and chest and belly movement.   Follow-Up: Your physician wants you to follow-up in: 6 months with Dr. Tamala Julian.  You will receive a reminder letter in the mail two months in advance. If you don't receive a letter, please call our office to schedule the follow-up appointment.   Any Other Special Instructions Will Be Listed Below (If Applicable).     If you need a refill on your cardiac medications before your next appointment, please call your pharmacy.

## 2016-08-21 ENCOUNTER — Telehealth: Payer: Self-pay | Admitting: Certified Nurse Midwife

## 2016-08-21 NOTE — Telephone Encounter (Signed)
Left message about form that she dropped off to be completed. [name], CNM is unable to complete and suggest patient have Cardiologist or PCP to complete. Form left in patient pick up drawer.

## 2016-08-24 ENCOUNTER — Telehealth: Payer: Self-pay | Admitting: Interventional Cardiology

## 2016-08-24 NOTE — Telephone Encounter (Signed)
Spoke with pt and she was asking about paperwork she had dropped off.  Advised I would locate paperwork and call when it is ready.  Pt appreciative for assistance.

## 2016-08-24 NOTE — Telephone Encounter (Signed)
Walk In pt form-[employer] Pre-Participation Physical form dropped off-placed in [employer].

## 2016-08-24 NOTE — Telephone Encounter (Signed)
Spoke with pt and made her aware that paperwork is ready and I will place at front desk for pick up.  Pt verbalized understanding and was appreciative for assistance.

## 2016-08-24 NOTE — Telephone Encounter (Signed)
f/u message  Pt call requesting to speak with RN. Pt asked that the Pre-Participation Physical form t be filled out today. Pt states she needs information by tomorrow for a trip she is going on. Please call back to discuss

## 2016-08-25 ENCOUNTER — Telehealth (HOSPITAL_COMMUNITY): Payer: Self-pay | Admitting: Physician Assistant

## 2016-08-25 NOTE — Telephone Encounter (Signed)
Called pt and spoke with her to see if she wanted to reschedule her ETT that was cancelled on 06/25/16 and she voiced that she is no longer having any chest pain. I will be removing her order from the workqueue.

## 2016-08-26 NOTE — Telephone Encounter (Signed)
Sounds good, I am ok with it as long as she is no longer symptomatic

## 2016-09-09 DIAGNOSIS — R05 Cough: Secondary | ICD-10-CM | POA: Diagnosis not present

## 2016-09-09 DIAGNOSIS — J0100 Acute maxillary sinusitis, unspecified: Secondary | ICD-10-CM | POA: Diagnosis not present

## 2016-09-24 ENCOUNTER — Telehealth: Payer: Self-pay | Admitting: *Deleted

## 2016-09-24 NOTE — Telephone Encounter (Addendum)
Called the patient to inform her of her upcoming home sleep study. LMTCB

## 2016-09-24 NOTE — Telephone Encounter (Signed)
Informed patient of upcoming home sleep study and patient understanding was verbalized. Patient understands she wiil be contacted by [place]. Patient understands to call if [facility] does not contact her in a timely manner. Patient agrees with treatment plan. Home Sleep study has been ordered

## 2016-09-25 NOTE — Telephone Encounter (Signed)
Faxed over last office note (08-17-2016)for the patient to [facility] home sleep today 09/25/2016. Waiting for sleep test to be done and results sent back to Dr. Radford Pax.

## 2016-10-04 ENCOUNTER — Encounter (HOSPITAL_BASED_OUTPATIENT_CLINIC_OR_DEPARTMENT_OTHER): Payer: BLUE CROSS/BLUE SHIELD

## 2016-10-13 NOTE — Telephone Encounter (Signed)
Renee Roberson [facility]) called today asking for [name] (cpap assistant) to call [employer] insurance at [phone]) and update the servicing provide to [facility] for this patient.  Called [employer] spoke to [place] gave the Auth# [id number] and the servicing provider was updated with no other changes.

## 2016-10-23 ENCOUNTER — Encounter: Payer: Self-pay | Admitting: Cardiology

## 2016-10-26 DIAGNOSIS — G478 Other sleep disorders: Secondary | ICD-10-CM | POA: Diagnosis not present

## 2016-10-26 DIAGNOSIS — I10 Essential (primary) hypertension: Secondary | ICD-10-CM | POA: Diagnosis not present

## 2016-10-26 DIAGNOSIS — R0683 Snoring: Secondary | ICD-10-CM | POA: Diagnosis not present

## 2016-10-27 NOTE — Telephone Encounter (Signed)
Received sleep study today and sent to be scanned

## 2016-11-19 ENCOUNTER — Telehealth: Payer: Self-pay | Admitting: *Deleted

## 2016-11-19 DIAGNOSIS — G4733 Obstructive sleep apnea (adult) (pediatric): Secondary | ICD-10-CM

## 2016-11-19 NOTE — Telephone Encounter (Addendum)
Informed patient of sleep study results and patient understanding was verbalized. Patient understands her TITRATION study is scheduled for Friday January 22 2017. Patient understands her TITRATION study will be done at [facility] sleep lab. Patient understands she will receive a sleep packet in a week or so. Patient understands to call if she does not receive the sleep packet in a timely manner. Patient agrees with treatment and thanked me for call  Patient understands her events are 80/hr with severe low oxygen at night. Patient says that explains why she is so tired in the mornings. Patient agrees with treatment and thanked me for the call. ESS=13

## 2016-11-20 ENCOUNTER — Encounter: Payer: Self-pay | Admitting: *Deleted

## 2016-11-20 NOTE — Telephone Encounter (Signed)
Severe OSA with AHI 80/hr with severe nocturnal hypoxemia - please set up for in lab CPAP titration. May need BiPAP given severity of OSA

## 2016-12-31 ENCOUNTER — Other Ambulatory Visit (HOSPITAL_COMMUNITY): Payer: BLUE CROSS/BLUE SHIELD

## 2017-01-05 ENCOUNTER — Other Ambulatory Visit: Payer: Self-pay

## 2017-01-05 ENCOUNTER — Ambulatory Visit (HOSPITAL_COMMUNITY): Payer: BLUE CROSS/BLUE SHIELD | Attending: Cardiology

## 2017-01-05 DIAGNOSIS — Z113 Encounter for screening for infections with a predominantly sexual mode of transmission: Secondary | ICD-10-CM | POA: Diagnosis not present

## 2017-01-05 DIAGNOSIS — I5042 Chronic combined systolic (congestive) and diastolic (congestive) heart failure: Secondary | ICD-10-CM | POA: Diagnosis not present

## 2017-01-05 DIAGNOSIS — R0789 Other chest pain: Secondary | ICD-10-CM | POA: Diagnosis not present

## 2017-01-05 DIAGNOSIS — Z6841 Body Mass Index (BMI) 40.0 and over, adult: Secondary | ICD-10-CM | POA: Insufficient documentation

## 2017-01-05 DIAGNOSIS — E282 Polycystic ovarian syndrome: Secondary | ICD-10-CM | POA: Insufficient documentation

## 2017-01-05 DIAGNOSIS — I110 Hypertensive heart disease with heart failure: Secondary | ICD-10-CM | POA: Insufficient documentation

## 2017-01-05 DIAGNOSIS — E669 Obesity, unspecified: Secondary | ICD-10-CM | POA: Insufficient documentation

## 2017-01-22 ENCOUNTER — Encounter (HOSPITAL_BASED_OUTPATIENT_CLINIC_OR_DEPARTMENT_OTHER): Payer: BLUE CROSS/BLUE SHIELD

## 2017-01-22 ENCOUNTER — Ambulatory Visit (HOSPITAL_BASED_OUTPATIENT_CLINIC_OR_DEPARTMENT_OTHER): Payer: BLUE CROSS/BLUE SHIELD | Attending: Cardiology | Admitting: Cardiology

## 2017-01-22 VITALS — Ht 65.0 in | Wt 246.0 lb

## 2017-01-22 DIAGNOSIS — I493 Ventricular premature depolarization: Secondary | ICD-10-CM | POA: Insufficient documentation

## 2017-01-22 DIAGNOSIS — G4733 Obstructive sleep apnea (adult) (pediatric): Secondary | ICD-10-CM | POA: Diagnosis not present

## 2017-01-22 DIAGNOSIS — G4730 Sleep apnea, unspecified: Secondary | ICD-10-CM | POA: Diagnosis present

## 2017-01-29 DIAGNOSIS — J020 Streptococcal pharyngitis: Secondary | ICD-10-CM | POA: Diagnosis not present

## 2017-02-02 NOTE — Procedures (Signed)
   Patient Name: Renee, Roberson Date: 01/22/2017 Gender: Female D.O.B: [birth year] Age (years): 35 Referring Provider: Daneen Schick Height (inches): 65 Interpreting Physician: Fransico Him MD, ABSM Weight (lbs): 246 RPSGT: Baxter Flattery BMI: 41 MRN: [id number] Neck Size: 16.00  CLINICAL INFORMATION The patient is referred for a CPAP titration to treat sleep apnea.  SLEEP STUDY TECHNIQUE As per the AASM Manual for the Scoring of Sleep and Associated Events v2.3 (April 2016) with a hypopnea requiring 4% desaturations.  The channels recorded and monitored were frontal, central and occipital EEG, electrooculogram (EOG), submentalis EMG (chin), nasal and oral airflow, thoracic and abdominal wall motion, anterior tibialis EMG, snore microphone, electrocardiogram, and pulse oximetry. Continuous positive airway pressure (CPAP) was initiated at the beginning of the study and titrated to treat sleep-disordered breathing.  MEDICATIONS Medications self-administered by patient taken the night of the study : N/A  TECHNICIAN COMMENTS Comments added by technician: Patient had difficulty initiating sleep.  Comments added by scorer: N/A  RESPIRATORY PARAMETERS Optimal PAP Pressure (cm): 14  AHI at Optimal Pressure (/hr):5.3 Overall Minimal O2 (%):68.00  Supine % at Optimal Pressure (%):0 Minimal O2 at Optimal Pressure (%): 91.0    SLEEP ARCHITECTURE The study was initiated at 11:17:52 PM and ended at 5:20:29 AM.  Sleep onset time was 4.4 minutes and the sleep efficiency was 97.0%. The total sleep time was 351.7 minutes.  The patient spent 5.12% of the night in stage N1 sleep, 50.96% in stage N2 sleep, 0.00% in stage N3 and 43.93% in REM.Stage REM latency was 48.5 minutes  Wake after sleep onset was 6.5. Alpha intrusion was absent. Supine sleep was 0.00%.  CARDIAC DATA The 2 lead EKG demonstrated sinus rhythm. The mean heart rate was 75.60 beats per minute. Other EKG findings  include: PVCs.  LEG MOVEMENT DATA The total Periodic Limb Movements of Sleep (PLMS) were 81. The PLMS index was 13.82. A PLMS index of <15 is considered normal in adults.  IMPRESSIONS - The optimal PAP pressure was 14 cm of water. - Central sleep apnea was not noted during this titration (CAI = 0.3/h). - Severe oxygen desaturations were observed during this titration (min O2 = 68.00%). - No snoring was audible during this study. - 2-lead EKG demonstrated: PVCs - Mild periodic limb movements were observed during this study. Arousals associated with PLMs were rare.  DIAGNOSIS - Obstructive Sleep Apnea (327.23 [G47.33 ICD-10])  RECOMMENDATIONS - Trial of CPAP therapy on 14 cm H2O with a Medium size Fisher&Paykel Nasal Mask Eson2 mask and heated humidification. - Avoid alcohol, sedatives and other CNS depressants that may worsen sleep apnea and disrupt normal sleep architecture. - Sleep hygiene should be reviewed to assess factors that may improve sleep quality. - Weight management and regular exercise should be initiated or continued. - Return to Sleep Center for re-evaluation after 10 weeks of therapy  [place], [place] [facility]  ELECTRONICALLY SIGNED ON:  02/02/2017, 6:52 PM [place] PH: [phone]   FX: [phone] [place]

## 2017-02-09 ENCOUNTER — Telehealth: Payer: Self-pay | Admitting: *Deleted

## 2017-02-09 NOTE — Telephone Encounter (Signed)
-----   Message from Sueanne Margarita, MD sent at 02/09/2017  4:29 PM EDT ----- No sorry, this is what I meant  Please let patient know that they had a successful PAP titration and let DME know that orders are in EPIC.  Please set up 10 week OV with me.   Traci ----- Message ----- From: Freada Bergeron, CMA Sent: 02/09/2017   3:09 PM To: Sueanne Margarita, MD  Dr Radford Pax is this the message you want to call and tell the patient? Please advise ----- Message ----- From: Sueanne Margarita, MD Sent: 02/02/2017   6:56 PM To: Freada Bergeron, CMA  Patient has been using and benefiting from CPAP use and will continue to benefit from therapy.

## 2017-02-09 NOTE — Telephone Encounter (Signed)
Informed patient of titration results and verbalized understanding was indicated. Patient understands Dr Radford Pax has ordered her a CPAP in epic. Patient understands she will be contacted by [place] to set up her cpap. She understands to call if [facility] does not contact her with new setup in a timely manner. She understands she will be called once confirmation has been received from [facility] that she has received her new machine to schedule 10 week follow up appointment.  [facility] notified of new cpap order  Please add to airview She was grateful for the call and thanked me

## 2017-03-09 DIAGNOSIS — G4733 Obstructive sleep apnea (adult) (pediatric): Secondary | ICD-10-CM | POA: Diagnosis not present

## 2017-04-09 DIAGNOSIS — Z23 Encounter for immunization: Secondary | ICD-10-CM | POA: Diagnosis not present

## 2017-04-09 DIAGNOSIS — M109 Gout, unspecified: Secondary | ICD-10-CM | POA: Diagnosis not present

## 2017-04-09 DIAGNOSIS — Z113 Encounter for screening for infections with a predominantly sexual mode of transmission: Secondary | ICD-10-CM | POA: Diagnosis not present

## 2017-04-09 DIAGNOSIS — G4733 Obstructive sleep apnea (adult) (pediatric): Secondary | ICD-10-CM | POA: Diagnosis not present

## 2017-04-09 DIAGNOSIS — K219 Gastro-esophageal reflux disease without esophagitis: Secondary | ICD-10-CM | POA: Diagnosis not present

## 2017-04-09 DIAGNOSIS — I5042 Chronic combined systolic (congestive) and diastolic (congestive) heart failure: Secondary | ICD-10-CM | POA: Diagnosis not present

## 2017-05-01 DIAGNOSIS — S338XXA Sprain of other parts of lumbar spine and pelvis, initial encounter: Secondary | ICD-10-CM | POA: Diagnosis not present

## 2017-05-03 DIAGNOSIS — M545 Low back pain: Secondary | ICD-10-CM | POA: Diagnosis not present

## 2017-05-09 DIAGNOSIS — G4733 Obstructive sleep apnea (adult) (pediatric): Secondary | ICD-10-CM | POA: Diagnosis not present

## 2017-06-09 ENCOUNTER — Other Ambulatory Visit: Payer: Self-pay | Admitting: Physician Assistant

## 2017-06-09 DIAGNOSIS — G4733 Obstructive sleep apnea (adult) (pediatric): Secondary | ICD-10-CM | POA: Diagnosis not present

## 2017-06-09 NOTE — Telephone Encounter (Signed)
Please review for refill, Thanks !  

## 2017-06-11 ENCOUNTER — Other Ambulatory Visit: Payer: Self-pay | Admitting: Interventional Cardiology

## 2017-06-11 ENCOUNTER — Other Ambulatory Visit: Payer: Self-pay | Admitting: Physician Assistant

## 2017-06-11 MED ORDER — FUROSEMIDE 40 MG PO TABS: 40.0000 mg | ORAL_TABLET | Freq: Every day | ORAL | 0 refills | Status: DC

## 2017-06-11 MED ORDER — AMLODIPINE BESY-BENAZEPRIL HCL 5-40 MG PO CAPS: 1.0000 | ORAL_CAPSULE | Freq: Every day | ORAL | 0 refills | Status: DC

## 2017-06-11 NOTE — Telephone Encounter (Signed)
Please review for refill, Thanks !  

## 2017-06-12 ENCOUNTER — Other Ambulatory Visit: Payer: Self-pay | Admitting: Physician Assistant

## 2017-06-14 NOTE — Telephone Encounter (Signed)
Please review for refill, Thanks !  

## 2017-07-10 DIAGNOSIS — G4733 Obstructive sleep apnea (adult) (pediatric): Secondary | ICD-10-CM | POA: Diagnosis not present

## 2017-08-07 DIAGNOSIS — G4733 Obstructive sleep apnea (adult) (pediatric): Secondary | ICD-10-CM | POA: Diagnosis not present

## 2017-08-11 ENCOUNTER — Ambulatory Visit: Payer: BLUE CROSS/BLUE SHIELD | Admitting: Certified Nurse Midwife

## 2017-08-12 ENCOUNTER — Encounter: Payer: Self-pay | Admitting: Certified Nurse Midwife

## 2017-08-12 ENCOUNTER — Ambulatory Visit (INDEPENDENT_AMBULATORY_CARE_PROVIDER_SITE_OTHER): Payer: BLUE CROSS/BLUE SHIELD | Admitting: Certified Nurse Midwife

## 2017-08-12 ENCOUNTER — Other Ambulatory Visit: Payer: Self-pay

## 2017-08-12 VITALS — BP 110/80 | HR 70 | Resp 16 | Ht 64.25 in | Wt 243.0 lb

## 2017-08-12 DIAGNOSIS — R350 Frequency of micturition: Secondary | ICD-10-CM | POA: Diagnosis not present

## 2017-08-12 DIAGNOSIS — Z0000 Encounter for general adult medical examination without abnormal findings: Secondary | ICD-10-CM | POA: Diagnosis not present

## 2017-08-12 DIAGNOSIS — Z01419 Encounter for gynecological examination (general) (routine) without abnormal findings: Secondary | ICD-10-CM | POA: Diagnosis not present

## 2017-08-12 DIAGNOSIS — Z113 Encounter for screening for infections with a predominantly sexual mode of transmission: Secondary | ICD-10-CM | POA: Diagnosis not present

## 2017-08-12 LAB — POCT URINALYSIS DIPSTICK
Bilirubin, UA: NEGATIVE
Blood, UA: NEGATIVE
Glucose, UA: NEGATIVE
Ketones, UA: NEGATIVE
Nitrite, UA: NEGATIVE
Urobilinogen, UA: NEGATIVE E.U./dL — AB
pH, UA: 5 (ref 5.0–8.0)

## 2017-08-12 NOTE — Progress Notes (Signed)
37 y.o. G1P0010 Single  African American Fe here for annual exam. Contraception Mirena IUD working well. Due for removal in one year. No partner change, STD screening requested, no concerns.  Continues to see Dr. Alyson Ingles for back issues, hypertension and labs, with some change. Complaining of low abdominal pain with increase in discharge no odor after sexual activity. Denies dysuria, no blood in urine, slight frequency.  Wonders if vaginal issues, will screen today. No other health issues today.  No LMP recorded. Patient is not currently having periods (Reason: IUD).          Sexually active: Yes.    The current method of family planning is IUD & condoms.    Exercising: Yes.    bowling Smoker:  no  Health Maintenance: Pap:  07-24-15 neg History of Abnormal Pap: yes MMG:  3/18 bilateral & left breast u/s category a density birads 2:neg Self Breast exams: yes Colonoscopy:  none BMD:   none TDaP:  2013 Shingles: no Pneumonia: no Hep C and HIV: both neg 2018 Labs: poct urine- WBC 1+, protein-tr   reports that  has never smoked. she has never used smokeless tobacco. She reports that she does not drink alcohol or use drugs.  Past Medical History:  Diagnosis Date  . Amenorrhea   . CHF (congestive heart failure) ([place])   . Coronary artery disease   . Depression   . GERD (gastroesophageal reflux disease)   . Gout   . Hepatic steatosis   . Hepatomegaly   . HTN (hypertension)   . Infertility, female   . Migraine   . PCOS (polycystic ovarian syndrome)   . STD (sexually transmitted disease)    HSV (genital)  . Systolic heart failure [facility])     Past Surgical History:  Procedure Laterality Date  . no surgical hx      Current Outpatient Medications  Medication Sig Dispense Refill  . amLODipine-benazepril (LOTREL) 5-40 MG capsule Take 1 capsule by mouth daily. Please make yearly appt with Dr. Tamala Julian for March before anymore refills. 1st attempt 90 capsule 0  .  aspirin-acetaminophen-caffeine (EXCEDRIN MIGRAINE) 250-250-65 MG tablet Take 1 tablet by mouth every 6 (six) hours as needed for headache or migraine.     . carvedilol (COREG) 25 MG tablet TAKE 1 TABLET BY MOUTH TWICE DAILY WITH MEAL Please call and schedule an appointment for further refills 1st attempt 180 tablet 0  . furosemide (LASIX) 40 MG tablet Take 1 tablet (40 mg total) by mouth daily. Please make yearly appt with Dr. Tamala Julian for March before anymore refills. 1st attempt 90 tablet 0   No current facility-administered medications for this visit.     Family History  Problem Relation Age of Onset  . Hypertension Mother   . Hypertension Father   . Thyroid disease Father   . Hypertension Sister   . Cancer Maternal Grandmother        ovarian or colon  . Diabetes Paternal Grandfather     ROS:  Pertinent items are noted in HPI.  Otherwise, a comprehensive ROS was negative.  Exam:   BP 110/80   Pulse 70   Resp 16   Ht 5' 4.25" (1.632 m)   Wt 243 lb (110.2 kg)   BMI 41.39 kg/m  Height: 5' 4.25" (163.2 cm) Ht Readings from Last 3 Encounters:  08/12/17 5' 4.25" (1.632 m)  01/22/17 5\' 5"  (1.651 m)  08/17/16 5' 4.75" (1.645 m)    General appearance: alert, cooperative and appears  stated age Head: Normocephalic, without obvious abnormality, atraumatic Neck: no adenopathy, supple, symmetrical, trachea midline and thyroid normal to inspection and palpation Lungs: clear to auscultation bilaterally Breasts: normal appearance, no masses or tenderness, No nipple retraction or dimpling, No nipple discharge or bleeding, No axillary or supraclavicular adenopathy Heart: regular rate and rhythm Abdomen: soft, non-tender; no masses,  no organomegaly Extremities: extremities normal, atraumatic, no cyanosis or edema Skin: Skin color, texture, turgor normal. No rashes or lesions Lymph nodes: Cervical, supraclavicular, and axillary nodes normal. No abnormal inguinal nodes palpated Neurologic:  Grossly normal   Pelvic: External genitalia:  no lesions              Urethra:  normal appearing urethra with no masses, tenderness or lesions              Bartholin's and Skene's: normal                 Vagina: normal appearing vagina with normal color and discharge, no lesions              Cervix: no cervical motion tenderness, no lesions and normal appearance              Pap taken: No. Bimanual Exam:  Uterus:  normal size, contour, position, consistency, mobility, non-tender              Adnexa: normal adnexa and no mass, fullness, tenderness               Rectovaginal: Confirms               Anus:  normal sphincter tone, no lesions  Chaperone present: yes  A:  Well Woman with normal exam  Contraception MIrena IUD due for removal 11/2018  R/O UTI  Hypertension/cholesterol and CHF with MD management  STD screening  Obesity    P:   Reviewed health and wellness pertinent to exam  Reviewed warning signs of IUD and need to advise. May do exchange next year with due for removal.  Lab: Urine micro    Discussed need to increase water intake and call if symptoms change.  Stressed exercise and diet control for healthier lifestyle. Continue follow up with PCP and Cardiology as needed.  Lab: STD panel, Hep C, GC/Chlamydia, Affirm  Pap smear: no   counseled on breast self exam, STD prevention, HIV risk factors and prevention, adequate intake of calcium and vitamin D, diet and exercise  return annually or prn  An After Visit Summary was printed and given to the patient.

## 2017-08-13 ENCOUNTER — Other Ambulatory Visit: Payer: Self-pay

## 2017-08-13 LAB — HEP, RPR, HIV PANEL
HIV Screen 4th Generation wRfx: NONREACTIVE
Hepatitis B Surface Ag: NEGATIVE
RPR Ser Ql: NONREACTIVE

## 2017-08-13 LAB — URINALYSIS, MICROSCOPIC ONLY
Casts: NONE SEEN /LPF
Epithelial Cells (non renal): >10 /HPF — AB
WBC, UA: >30 /HPF — AB

## 2017-08-13 LAB — GC/CHLAMYDIA PROBE AMP
Chlamydia trachomatis, NAA: NEGATIVE
Neisseria gonorrhoeae by PCR: NEGATIVE

## 2017-08-13 LAB — VAGINITIS/VAGINOSIS, DNA PROBE
Candida Species: NEGATIVE
Gardnerella vaginalis: POSITIVE — AB
Trichomonas vaginosis: NEGATIVE

## 2017-08-13 LAB — HEPATITIS C ANTIBODY: Hep C Virus Ab: <0.1 s/co ratio (ref 0.0–0.9)

## 2017-08-13 MED ORDER — METRONIDAZOLE 500 MG PO TABS: 500.0000 mg | ORAL_TABLET | Freq: Two times a day (BID) | ORAL | 0 refills | Status: DC

## 2017-08-27 NOTE — Telephone Encounter (Signed)
This encounter was created in error - please disregard.

## 2017-08-27 NOTE — Telephone Encounter (Signed)
Patient has a overdue 10 week follow up appointment scheduled for Thursday May 16 at 8:40 2019. Patient understands she needs to keep this appointment for insurance compliance. Patient was grateful for the call and thanked me.

## 2017-09-07 DIAGNOSIS — G4733 Obstructive sleep apnea (adult) (pediatric): Secondary | ICD-10-CM | POA: Diagnosis not present

## 2017-09-13 ENCOUNTER — Other Ambulatory Visit: Payer: Self-pay | Admitting: Interventional Cardiology

## 2017-10-07 DIAGNOSIS — G4733 Obstructive sleep apnea (adult) (pediatric): Secondary | ICD-10-CM | POA: Diagnosis not present

## 2017-10-11 NOTE — Telephone Encounter (Addendum)
Patient has rescheduled her 10 week compliance appointment from 10/14/17 to 02/21/18 patient's appointment is already overdue for insurance.  Per Ivin Booty ([facility]) the patient has been non compliant in her 90 day period and she will be picking up the unit asap. Ivin Booty states the patient can keep her 02/21/18 appointment to discuss her non compliance and a new cpap restart.

## 2017-10-14 ENCOUNTER — Ambulatory Visit: Payer: BLUE CROSS/BLUE SHIELD | Admitting: Cardiology

## 2017-10-27 ENCOUNTER — Other Ambulatory Visit: Payer: Self-pay | Admitting: Interventional Cardiology

## 2017-10-29 ENCOUNTER — Other Ambulatory Visit: Payer: Self-pay | Admitting: Interventional Cardiology

## 2017-10-29 NOTE — Telephone Encounter (Signed)
Ok to fill for 30 day supply with note to make an appt.  Pt seen 07/2016 and was suppose to be seen in 6 mos.

## 2017-10-29 NOTE — Telephone Encounter (Signed)
Not listed on patients current med list. Should she still be taking? Please advise. Thanks, MI

## 2017-10-31 DIAGNOSIS — H9202 Otalgia, left ear: Secondary | ICD-10-CM | POA: Diagnosis not present

## 2017-10-31 DIAGNOSIS — H60502 Unspecified acute noninfective otitis externa, left ear: Secondary | ICD-10-CM | POA: Diagnosis not present

## 2017-10-31 DIAGNOSIS — H6092 Unspecified otitis externa, left ear: Secondary | ICD-10-CM | POA: Diagnosis not present

## 2017-10-31 DIAGNOSIS — Z91013 Allergy to seafood: Secondary | ICD-10-CM | POA: Diagnosis not present

## 2017-10-31 DIAGNOSIS — I509 Heart failure, unspecified: Secondary | ICD-10-CM | POA: Diagnosis not present

## 2017-10-31 DIAGNOSIS — Z79899 Other long term (current) drug therapy: Secondary | ICD-10-CM | POA: Diagnosis not present

## 2017-10-31 DIAGNOSIS — I110 Hypertensive heart disease with heart failure: Secondary | ICD-10-CM | POA: Diagnosis not present

## 2017-11-02 DIAGNOSIS — H6122 Impacted cerumen, left ear: Secondary | ICD-10-CM

## 2017-11-02 DIAGNOSIS — H9012 Conductive hearing loss, unilateral, left ear, with unrestricted hearing on the contralateral side: Secondary | ICD-10-CM

## 2017-11-02 DIAGNOSIS — H9202 Otalgia, left ear: Secondary | ICD-10-CM | POA: Diagnosis not present

## 2017-11-02 HISTORY — DX: Conductive hearing loss, unilateral, left ear, with unrestricted hearing on the contralateral side: H90.12

## 2017-11-02 HISTORY — DX: Impacted cerumen, left ear: H61.22

## 2017-11-25 ENCOUNTER — Other Ambulatory Visit: Payer: Self-pay | Admitting: Interventional Cardiology

## 2017-11-25 NOTE — Telephone Encounter (Signed)
Ok to give 2 more weeks with note to contact office.  If appt not made, no further refills.

## 2017-11-25 NOTE — Telephone Encounter (Signed)
Patient is overdue for an appointment with Dr Tamala Julian and has been given multiple refills with a note to call and schedule. She also had a recall letter mailed to her. She still has not scheduled an appointment. Please advise. Thanks, MI

## 2017-11-26 ENCOUNTER — Other Ambulatory Visit: Payer: Self-pay | Admitting: Interventional Cardiology

## 2017-12-24 ENCOUNTER — Other Ambulatory Visit: Payer: Self-pay | Admitting: Interventional Cardiology

## 2017-12-24 MED ORDER — FUROSEMIDE 40 MG PO TABS: 40.0000 mg | ORAL_TABLET | Freq: Every day | ORAL | 0 refills | Status: DC

## 2017-12-24 MED ORDER — CARVEDILOL 25 MG PO TABS: 25.0000 mg | ORAL_TABLET | Freq: Two times a day (BID) | ORAL | 0 refills | Status: DC

## 2017-12-24 MED ORDER — AMLODIPINE BESY-BENAZEPRIL HCL 5-40 MG PO CAPS: 1.0000 | ORAL_CAPSULE | Freq: Every day | ORAL | 0 refills | Status: DC

## 2017-12-24 MED ORDER — POTASSIUM CHLORIDE CRYS ER 10 MEQ PO TBCR: 10.0000 meq | EXTENDED_RELEASE_TABLET | Freq: Every day | ORAL | 0 refills | Status: DC

## 2017-12-24 NOTE — Telephone Encounter (Signed)
New Message     *STAT* If patient is at the pharmacy, call can be transferred to refill team.   1. Which medications need to be refilled? (please list name of each medication and dose if known) amLODipine-benazepril (LOTREL) 5-40 MG capsule, carvedilol (COREG) 25 MG tablet, potassium chloride (K-DUR,KLOR-CON) 10 MEQ tablet, furosemide (LASIX) 40 MG tablet  2. Which pharmacy/location (including street and city if local pharmacy) is medication to be sent to? [place], [place] - [address] AT [place] [address]. Do they need a 30 day or 90 day supply? 30   Patient has scheduled the appointment for Dr. Tamala Julian.

## 2017-12-24 NOTE — Telephone Encounter (Signed)
Pt's medications were sent to pt's pharmacy as requested. Confirmation received.  

## 2017-12-27 DIAGNOSIS — E785 Hyperlipidemia, unspecified: Secondary | ICD-10-CM | POA: Diagnosis not present

## 2017-12-27 DIAGNOSIS — I10 Essential (primary) hypertension: Secondary | ICD-10-CM | POA: Diagnosis not present

## 2017-12-27 DIAGNOSIS — M545 Low back pain: Secondary | ICD-10-CM | POA: Diagnosis not present

## 2018-01-19 ENCOUNTER — Other Ambulatory Visit: Payer: Self-pay | Admitting: Interventional Cardiology

## 2018-01-25 ENCOUNTER — Encounter: Payer: Self-pay | Admitting: Nurse Practitioner

## 2018-01-25 ENCOUNTER — Ambulatory Visit (INDEPENDENT_AMBULATORY_CARE_PROVIDER_SITE_OTHER): Payer: BLUE CROSS/BLUE SHIELD | Admitting: Nurse Practitioner

## 2018-01-25 VITALS — BP 120/90 | HR 85 | Ht 65.0 in | Wt 240.8 lb

## 2018-01-25 DIAGNOSIS — R0683 Snoring: Secondary | ICD-10-CM | POA: Diagnosis not present

## 2018-01-25 DIAGNOSIS — I5042 Chronic combined systolic (congestive) and diastolic (congestive) heart failure: Secondary | ICD-10-CM | POA: Diagnosis not present

## 2018-01-25 MED ORDER — FUROSEMIDE 40 MG PO TABS: 40.0000 mg | ORAL_TABLET | Freq: Every day | ORAL | 3 refills | Status: DC

## 2018-01-25 MED ORDER — AMLODIPINE BESY-BENAZEPRIL HCL 5-40 MG PO CAPS: 1.0000 | ORAL_CAPSULE | Freq: Every day | ORAL | 3 refills | Status: DC

## 2018-01-25 MED ORDER — POTASSIUM CHLORIDE CRYS ER 10 MEQ PO TBCR: 10.0000 meq | EXTENDED_RELEASE_TABLET | Freq: Every day | ORAL | 3 refills | Status: DC

## 2018-01-25 MED ORDER — CARVEDILOL 25 MG PO TABS: 25.0000 mg | ORAL_TABLET | Freq: Two times a day (BID) | ORAL | 3 refills | Status: DC

## 2018-01-25 NOTE — Progress Notes (Signed)
CARDIOLOGY OFFICE NOTE  Date:  01/25/2018    Renee Roberson Date of Birth: Jun 16, 1980 Medical Record #[id number]  PCP:  Maury Dus, MD  Cardiologist:  Jennings Books    Chief Complaint  Patient presents with  . Congestive Heart Failure    Follow up visit - seen for Dr. Tamala Julian    History of Present Illness: Renee Roberson is a 37 y.o. female who presents today for a follow up visit. Seen for Dr. Tamala Julian.   She has a history of HTN, PCOS, chest pain, and chronic combined systolic and diastolic HF. Her previous ejection fraction was 25% in September 2014, and this  improved to 40-45% by March 2015 and then normalized to EF of 60 to 65% per echo in 12/2016.   Last seen in March of 2018 - chronic fatigue but otherwise doing ok.   Comes in today. Here alone. She does not know her medicines. Needs refills. ?more abdominal pain with the higher dose of the potassium. She feels pretty good. No chest pain. Breathing is good. She has lost a few pounds. Doing more "bowling". No swelling. BP is running good at home.   Past Medical History:  Diagnosis Date  . Abnormal Pap smear of cervix   . Accelerated essential hypertension 03/20/2013   Left ventricular systolic dysfunction as a complication   . Amenorrhea   . CHF (congestive heart failure) ([place])   . Chronic combined systolic and diastolic heart failure ([place]) 11/13/2013   LVEF improved from 25% to 45% after 6 months of antihypertensive therapy   . Coronary artery disease   . Daytime sleepiness 08/17/2016  . Depression   . Diverticulitis of large intestine 12/30/2015  . GERD (gastroesophageal reflux disease)   . Gout   . Hepatic steatosis   . Hepatomegaly   . HTN (hypertension)   . Hypotension 08/20/2014  . Infertility, female   . Migraine   . Morbid obesity ([place]) 03/20/2013  . PCOS (polycystic ovarian syndrome)   . Snoring 08/17/2016  . STD (sexually transmitted disease)    HSV (genital)  . Systolic heart failure  [facility])     Past Surgical History:  Procedure Laterality Date  . COLPOSCOPY  2015  . no surgical hx       Medications: Current Meds  Medication Sig  . aspirin-acetaminophen-caffeine (EXCEDRIN MIGRAINE) 250-250-65 MG tablet Take 1 tablet by mouth every 6 (six) hours as needed for headache or migraine.   Marland Kitchen omeprazole (PRILOSEC) 40 MG capsule Take 40 mg by mouth as needed (heartburn).   . [DISCONTINUED] amLODipine-benazepril (LOTREL) 5-40 MG capsule Take 1 capsule by mouth daily. Please keep upcoming appt for future refills. Thank you  . [DISCONTINUED] carvedilol (COREG) 25 MG tablet Take 1 tablet (25 mg total) by mouth 2 (two) times daily with a meal. Please keep upcoming appt for future refills. Thank you  . [DISCONTINUED] furosemide (LASIX) 40 MG tablet Take 1 tablet (40 mg total) by mouth daily. Please keep upcoming appt for future refills. Thank you  . [DISCONTINUED] metroNIDAZOLE (FLAGYL) 500 MG tablet Take 1 tablet (500 mg total) by mouth 2 (two) times daily.  . [DISCONTINUED] potassium chloride (K-DUR,KLOR-CON) 10 MEQ tablet Take 1 tablet (10 mEq total) by mouth daily. Please keep upcoming appt before anymore refills. Thank you  . amLODipine-benazepril (LOTREL) 5-40 MG capsule Take 1 capsule by mouth daily. Please keep upcoming appt for future refills. Thank you  . carvedilol (COREG) 25 MG tablet Take 1  tablet (25 mg total) by mouth 2 (two) times daily with a meal. Please keep upcoming appt for future refills. Thank you  . furosemide (LASIX) 40 MG tablet Take 1 tablet (40 mg total) by mouth daily. Please keep upcoming appt for future refills. Thank you  . potassium chloride (K-DUR,KLOR-CON) 10 MEQ tablet Take 1 tablet (10 mEq total) by mouth daily. Please keep upcoming appt before anymore refills. Thank you     Allergies: Allergies  Allergen Reactions  . Fish Allergy Anaphylaxis, Hives and Swelling    Social History: The patient  reports that she has never smoked. She has never  used smokeless tobacco. She reports that she does not drink alcohol or use drugs.   Family History: The patient's family history includes Cancer in her maternal grandmother; Diabetes in her paternal grandfather; Hypertension in her father, mother, and sister; Thyroid disease in her father.   Review of Systems: Please see the history of present illness.   Otherwise, the review of systems is positive for none.   All other systems are reviewed and negative.   Physical Exam: VS:  BP 120/90 (BP Location: Left Arm, Patient Position: Sitting, Cuff Size: Normal)   Pulse 85   Ht 5\' 5"  (1.651 m)   Wt 240 lb 12.8 oz (109.2 kg)   BMI 40.07 kg/m  .  BMI Body mass index is 40.07 kg/m.  Wt Readings from Last 3 Encounters:  01/25/18 240 lb 12.8 oz (109.2 kg)  08/12/17 243 lb (110.2 kg)  01/22/17 246 lb (111.6 kg)    General: Pleasant. Obese. Alert and in no acute distress.   HEENT: Normal.  Neck: Supple, no JVD, carotid bruits, or masses noted.  Cardiac: Regular rate and rhythm. No murmurs, rubs, or gallops. No edema.  Respiratory:  Lungs are clear to auscultation bilaterally with normal work of breathing.  GI: Soft and nontender.  MS: No deformity or atrophy. Gait and ROM intact.  Skin: Warm and dry. Color is normal.  Neuro:  Strength and sensation are intact and no gross focal deficits noted.  Psych: Alert, appropriate and with normal affect.   LABORATORY DATA:  EKG:  EKG is ordered today. This demonstrates NSR.  Lab Results  Component Value Date   WBC 7.1 05/19/2016   HGB 13.5 05/19/2016   HCT 39.9 05/19/2016   PLT 334 05/19/2016   GLUCOSE 119 (H) 05/19/2016   ALT 18 01/02/2015   AST 12 01/02/2015   NA 137 05/19/2016   K 3.7 05/19/2016   CL 102 05/19/2016   CREATININE 1.04 (H) 05/19/2016   BUN 11 05/19/2016   CO2 25 05/19/2016   TSH 1.49 01/02/2015       BNP (last 3 results) No results for input(s): BNP in the last 8760 hours.  ProBNP (last 3 results) No results for  input(s): PROBNP in the last 8760 hours.   Other Studies Reviewed Today:  Echo Study Conclusions 12/2016  - Left ventricle: The cavity size was normal. There was mild   concentric hypertrophy. Systolic function was normal. The   estimated ejection fraction was in the range of 60% to 65%. Wall   motion was normal; there were no regional wall motion   abnormalities. Features are consistent with a pseudonormal left   ventricular filling pattern, with concomitant abnormal relaxation   and increased filling pressure (grade 2 diastolic dysfunction). - Left atrium: The atrium was mildly dilated. - Pulmonary arteries: Systolic pressure could not be accurately   estimated.  Assessment/Plan:  1. Chronic combined systolic and diastolic HR - she has had normalization of her EF. She continues to be managed medically. NYHA I/II. No changes made today. Lab today. Refilled of her medicines.   2. Morbid obesity - encouragement given.   3.Snoring - now on CPAP  4. HTN - BP better at home by her report. Medicines refilled today. No changes made. Encouraged her to continue with her CV risk factor modification.   Current medicines are reviewed with the patient today.  The patient does not have concerns regarding medicines other than what has been noted above.  The following changes have been made:  See above.  Labs/ tests ordered today include:    Orders Placed This Encounter  Procedures  . Basic metabolic panel  . CBC  . EKG 12-Lead     Disposition:   FU with Dr. Tamala Julian or me in one year.   Patient is agreeable to this plan and will call if any problems develop in the interim.   SignedTruitt Merle, NP  01/25/2018 2:54 PM  [place] [address] [place], [place]  [address] Phone: [phone] Fax: [phone]

## 2018-01-25 NOTE — Patient Instructions (Addendum)
We will be checking the following labs today - BMET & CBC   Medication Instructions:    Continue with your current medicines.   I have sent in your refills    Testing/Procedures To Be Arranged:  N/A  Follow-Up:   See Dr. Tamala Julian or me in one year    Other Special Instructions:   N/A    If you need a refill on your cardiac medications before your next appointment, please call your pharmacy.   Call the [place] office at [phone] if you have any questions, problems or concerns.

## 2018-01-26 ENCOUNTER — Other Ambulatory Visit: Payer: Self-pay | Admitting: *Deleted

## 2018-01-26 ENCOUNTER — Telehealth: Payer: Self-pay | Admitting: Nurse Practitioner

## 2018-01-26 DIAGNOSIS — E876 Hypokalemia: Secondary | ICD-10-CM

## 2018-01-26 LAB — BASIC METABOLIC PANEL
BUN/Creatinine Ratio: 14 (ref 9–23)
BUN: 13 mg/dL (ref 6–20)
CO2: 25 mmol/L (ref 20–29)
Calcium: 10.4 mg/dL — ABNORMAL HIGH (ref 8.7–10.2)
Chloride: 104 mmol/L (ref 96–106)
Creatinine, Ser: 0.9 mg/dL (ref 0.57–1.00)
GFR calc Af Amer: 95 mL/min/{1.73_m2} (ref >59)
GFR calc non Af Amer: 83 mL/min/{1.73_m2} (ref >59)
Glucose: 93 mg/dL (ref 65–99)
Potassium: 5.3 mmol/L — ABNORMAL HIGH (ref 3.5–5.2)
Sodium: 143 mmol/L (ref 134–144)

## 2018-01-26 LAB — CBC
Hematocrit: 37.9 % (ref 34.0–46.6)
Hemoglobin: 12.9 g/dL (ref 11.1–15.9)
MCH: 29.3 pg (ref 26.6–33.0)
MCHC: 34 g/dL (ref 31.5–35.7)
MCV: 86 fL (ref 79–97)
Platelets: 272 10*3/uL (ref 150–450)
RBC: 4.41 x10E6/uL (ref 3.77–5.28)
RDW: 15.3 % (ref 12.3–15.4)
WBC: 6.1 10*3/uL (ref 3.4–10.8)

## 2018-01-26 NOTE — Telephone Encounter (Signed)
New Message   Patient is calling in reference to her lab results. Please call to discuss.

## 2018-02-21 ENCOUNTER — Other Ambulatory Visit: Payer: BLUE CROSS/BLUE SHIELD | Admitting: *Deleted

## 2018-02-21 ENCOUNTER — Encounter: Payer: Self-pay | Admitting: Cardiology

## 2018-02-21 ENCOUNTER — Telehealth: Payer: Self-pay | Admitting: *Deleted

## 2018-02-21 ENCOUNTER — Encounter

## 2018-02-21 ENCOUNTER — Ambulatory Visit (INDEPENDENT_AMBULATORY_CARE_PROVIDER_SITE_OTHER): Payer: BLUE CROSS/BLUE SHIELD | Admitting: Cardiology

## 2018-02-21 VITALS — BP 112/70 | HR 96 | Ht 65.0 in | Wt 237.4 lb

## 2018-02-21 DIAGNOSIS — G4733 Obstructive sleep apnea (adult) (pediatric): Secondary | ICD-10-CM

## 2018-02-21 DIAGNOSIS — Z9989 Dependence on other enabling machines and devices: Secondary | ICD-10-CM | POA: Diagnosis not present

## 2018-02-21 DIAGNOSIS — E876 Hypokalemia: Secondary | ICD-10-CM | POA: Diagnosis not present

## 2018-02-21 DIAGNOSIS — I10 Essential (primary) hypertension: Secondary | ICD-10-CM

## 2018-02-21 HISTORY — DX: Obstructive sleep apnea (adult) (pediatric): Z99.89

## 2018-02-21 HISTORY — DX: Obstructive sleep apnea (adult) (pediatric): G47.33

## 2018-02-21 LAB — BASIC METABOLIC PANEL
BUN/Creatinine Ratio: 10 (ref 9–23)
BUN: 10 mg/dL (ref 6–20)
CO2: 23 mmol/L (ref 20–29)
Calcium: 9.6 mg/dL (ref 8.7–10.2)
Chloride: 100 mmol/L (ref 96–106)
Creatinine, Ser: 0.98 mg/dL (ref 0.57–1.00)
GFR calc Af Amer: 86 mL/min/{1.73_m2} (ref >59)
GFR calc non Af Amer: 74 mL/min/{1.73_m2} (ref >59)
Glucose: 111 mg/dL — ABNORMAL HIGH (ref 65–99)
Potassium: 3.8 mmol/L (ref 3.5–5.2)
Sodium: 141 mmol/L (ref 134–144)

## 2018-02-21 NOTE — Progress Notes (Signed)
Cardiology Office Note:    Date:  02/21/2018   ID:  Renee Roberson, DOB [birth year], MRN [id number]  PCP:  Maury Dus, MD  Cardiologist:  No primary care provider on file.    Referring MD: Maury Dus, MD   Chief Complaint  Patient presents with  . Sleep Apnea  . Hypertension    History of Present Illness:    Renee Roberson is a 37 y.o. female with a hx of CHF who was referred for sleep study by Dr. Daneen Schick.  She underwent home sleep study which showed severe obstructive sleep apnea with an AHI of 62.3 on night 1 and 92.0 and night to with oxygen desaturations less than 70% for 1 minute.  A total of 1 hour and 22 minutes was noted with O2 sats less than 89%.  52% of the time was spent with oxygen saturations less than 90%.  She underwent CPAP titration to 14 cm H2O and is now here for evaluation.  She is doing well with her CPAP device and thinks that she has gotten used to it.  She tolerates the nasal mask and feels the pressure is adequate.  Since going on CPAP she feels rested in the am and has no significant daytime sleepiness.  She denies any  nasal dryness or nasal congestion but has had some mouth dryness..  She does not think that he snores.    Past Medical History:  Diagnosis Date  . Abnormal Pap smear of cervix   . Accelerated essential hypertension 03/20/2013   Left ventricular systolic dysfunction as a complication   . Amenorrhea   . CHF (congestive heart failure) ([place])   . Chronic combined systolic and diastolic heart failure ([place]) 11/13/2013   LVEF improved from 25% to 45% after 6 months of antihypertensive therapy   . Coronary artery disease   . Daytime sleepiness 08/17/2016  . Depression   . Diverticulitis of large intestine 12/30/2015  . GERD (gastroesophageal reflux disease)   . Gout   . Hepatic steatosis   . Hepatomegaly   . HTN (hypertension)   . Hypotension 08/20/2014  . Infertility, female   . Migraine   . Morbid obesity ([place]) 03/20/2013  .  OSA on CPAP 02/21/2018   Home sleep study showed severe obstructive sleep apnea with an AHI of 62.3 on night 1 and 92.0 and night to with oxygen desaturations less than 70% for 1 minute.  A total of 1 hour and 22 minutes was noted with O2 sats less than 89%.  52% of the time was spent with oxygen saturations less than 90%. Now on CPAP at 14 cm H2O   . PCOS (polycystic ovarian syndrome)   . Snoring 08/17/2016  . STD (sexually transmitted disease)    HSV (genital)  . Systolic heart failure [facility])     Past Surgical History:  Procedure Laterality Date  . COLPOSCOPY  2015  . no surgical hx      Current Medications: Current Meds  Medication Sig  . amLODipine-benazepril (LOTREL) 5-40 MG capsule Take 1 capsule by mouth daily. Please keep upcoming appt for future refills. Thank you  . aspirin-acetaminophen-caffeine (EXCEDRIN MIGRAINE) 250-250-65 MG tablet Take 1 tablet by mouth every 6 (six) hours as needed for headache or migraine.   . carvedilol (COREG) 25 MG tablet Take 1 tablet (25 mg total) by mouth 2 (two) times daily with a meal. Please keep upcoming appt for future refills. Thank you  . furosemide (LASIX) 40 MG tablet  Take 1 tablet (40 mg total) by mouth daily. Please keep upcoming appt for future refills. Thank you  . omeprazole (PRILOSEC) 40 MG capsule Take 40 mg by mouth as needed (heartburn).      Allergies:   Fish allergy   Social History   Socioeconomic History  . Marital status: Single    Spouse name: Not on file  . Number of children: Not on file  . Years of education: Not on file  . Highest education level: Not on file  Occupational History  . Not on file  Social Needs  . Financial resource strain: Not on file  . Food insecurity:    Worry: Not on file    Inability: Not on file  . Transportation needs:    Medical: Not on file    Non-medical: Not on file  Tobacco Use  . Smoking status: Never Smoker  . Smokeless tobacco: Never Used  Substance and Sexual Activity  .  Alcohol use: No    Alcohol/week: 0.0 standard drinks  . Drug use: No  . Sexual activity: Yes    Partners: Male    Birth control/protection: IUD, Condom  Lifestyle  . Physical activity:    Days per week: Not on file    Minutes per session: Not on file  . Stress: Not on file  Relationships  . Social connections:    Talks on phone: Not on file    Gets together: Not on file    Attends religious service: Not on file    Active member of club or organization: Not on file    Attends meetings of clubs or organizations: Not on file    Relationship status: Not on file  Other Topics Concern  . Not on file  Social History Narrative  . Not on file     Family History: The patient's family history includes Cancer in her maternal grandmother; Diabetes in her paternal grandfather; Hypertension in her father, mother, and sister; Thyroid disease in her father.  ROS:   Please see the history of present illness.    ROS  All other systems reviewed and negative.   EKGs/Labs/Other Studies Reviewed:    The following studies were reviewed today: PAP download  EKG:  EKG is not ordered today.    Recent Labs: 01/25/2018: BUN 13; Creatinine, Ser 0.90; Hemoglobin 12.9; Platelets 272; Potassium 5.3; Sodium 143   Recent Lipid Panel No results found for: CHOL, TRIG, HDL, CHOLHDL, VLDL, LDLCALC, LDLDIRECT  Physical Exam:    VS:  BP 112/70   Pulse 96   Ht 5\' 5"  (1.651 m)   Wt 237 lb 6.4 oz (107.7 kg)   SpO2 96%   BMI 39.51 kg/m     Wt Readings from Last 3 Encounters:  02/21/18 237 lb 6.4 oz (107.7 kg)  01/25/18 240 lb 12.8 oz (109.2 kg)  08/12/17 243 lb (110.2 kg)     GEN:  Well nourished, well developed in no acute distress HEENT: Normal NECK: No JVD; No carotid bruits LYMPHATICS: No lymphadenopathy CARDIAC: RRR, no murmurs, rubs, gallops RESPIRATORY:  Clear to auscultation without rales, wheezing or rhonchi  ABDOMEN: Soft, non-tender, non-distended MUSCULOSKELETAL:  No edema; No  deformity  SKIN: Warm and dry NEUROLOGIC:  Alert and oriented x 3 PSYCHIATRIC:  Normal affect   ASSESSMENT:    1. Accelerated essential hypertension   2. OSA on CPAP   3. Morbid obesity ([place])    PLAN:    In order of problems listed above:  1. OSA - the patient is tolerating PAP therapy well without any problems. The PAP download was reviewed today and showed an AHI of 0.4/hr on 14 cm H2O with 83% compliance in using more than 4 hours nightly.  The patient has been using and benefiting from PAP use and will continue to benefit from therapy.  I am going to add a chinstrap on since she is having some mouth dryness.  2.  HTN -BP is well controlled on exam today.  She will continue on carvedilol 25 mg twice daily and Lotrel 5-40 mg daily.  3.  Obesity - I have encouraged her to get into a routine exercise program and cut back on carbs and portions.    Medication Adjustments/Labs and Tests Ordered: Current medicines are reviewed at length with the patient today.  Concerns regarding medicines are outlined above.  No orders of the defined types were placed in this encounter.  No orders of the defined types were placed in this encounter.   Signed, Fransico Him, MD  02/21/2018 3:06 PM    [place]

## 2018-02-21 NOTE — Telephone Encounter (Signed)
-----   Message from Sarina Ill, RN sent at 02/21/2018  3:23 PM EDT ----- Regarding: Sleep Supplies Hello, Dr. Radford Pax requested to have a chin strap for the patient. Thanks

## 2018-02-21 NOTE — Telephone Encounter (Signed)
Order faxed to [facility]. 

## 2018-02-21 NOTE — Patient Instructions (Signed)
Medication Instructions:  Your physician recommends that you continue on your current medications as directed. Please refer to the Current Medication list given to you today.  Follow-Up: Your physician wants you to follow-up in: 1 year with Dr. Radford Pax.  You will receive a reminder letter in the mail two months in advance. If you don't receive a letter, please call our office to schedule the follow-up appointment.  If you need a refill on your cardiac medications before your next appointment, please call your pharmacy.

## 2018-03-15 ENCOUNTER — Other Ambulatory Visit: Payer: Self-pay | Admitting: Certified Nurse Midwife

## 2018-03-24 ENCOUNTER — Telehealth: Payer: BLUE CROSS/BLUE SHIELD | Admitting: Family

## 2018-03-24 DIAGNOSIS — B373 Candidiasis of vulva and vagina: Secondary | ICD-10-CM

## 2018-03-24 DIAGNOSIS — B9689 Other specified bacterial agents as the cause of diseases classified elsewhere: Secondary | ICD-10-CM

## 2018-03-24 DIAGNOSIS — B3731 Acute candidiasis of vulva and vagina: Secondary | ICD-10-CM

## 2018-03-24 DIAGNOSIS — N760 Acute vaginitis: Secondary | ICD-10-CM

## 2018-03-24 MED ORDER — FLUCONAZOLE 150 MG PO TABS: 150.0000 mg | ORAL_TABLET | Freq: Once | ORAL | 0 refills | Status: AC

## 2018-03-24 MED ORDER — METRONIDAZOLE 500 MG PO TABS: 500.0000 mg | ORAL_TABLET | Freq: Two times a day (BID) | ORAL | 0 refills | Status: DC

## 2018-03-24 NOTE — Progress Notes (Signed)
Thank you for the details you included in the comment boxes. Those details are very helpful in determining the best course of treatment for you and help [place] to provide the best care. I have also sent Diflucan 150mg  for possible yeast infection with the white discharge.   We are sorry that you are not feeling well. Here is how we plan to help! Based on what you shared with me it looks like you: May have a vaginosis due to bacteria  Vaginosis is an inflammation of the vagina that can result in discharge, itching and pain. The cause is usually a change in the normal balance of vaginal bacteria or an infection. Vaginosis can also result from reduced estrogen levels after menopause.  The most common causes of vaginosis are:   Bacterial vaginosis which results from an overgrowth of one on several organisms that are normally present in your vagina.   Yeast infections which are caused by a naturally occurring fungus called candida.   Vaginal atrophy (atrophic vaginosis) which results from the thinning of the vagina from reduced estrogen levels after menopause.   Trichomoniasis which is caused by a parasite and is commonly transmitted by sexual intercourse.  Factors that increase your risk of developing vaginosis include: Marland Kitchen Medications, such as antibiotics and steroids . Uncontrolled diabetes . Use of hygiene products such as bubble bath, vaginal spray or vaginal deodorant . Douching . Wearing damp or tight-fitting clothing . Using an intrauterine device (IUD) for birth control . Hormonal changes, such as those associated with pregnancy, birth control pills or menopause . Sexual activity . Having a sexually transmitted infection  Your treatment plan is Metronidazole or Flagyl 500mg  twice a day for 7 days.  I have electronically sent this prescription into the pharmacy that you have chosen.  Be sure to take all of the medication as directed. Stop taking any medication if you develop a rash, tongue  swelling or shortness of breath. Mothers who are breast feeding should consider pumping and discarding their breast milk while on these antibiotics. However, there is no consensus that infant exposure at these doses would be harmful.  Remember that medication creams can weaken latex condoms. Marland Kitchen   HOME CARE:  Good hygiene may prevent some types of vaginosis from recurring and may relieve some symptoms:  . Avoid baths, hot tubs and whirlpool spas. Rinse soap from your outer genital area after a shower, and dry the area well to prevent irritation. Don't use scented or harsh soaps, such as those with deodorant or antibacterial action. Marland Kitchen Avoid irritants. These include scented tampons and pads. . Wipe from front to back after using the toilet. Doing so avoids spreading fecal bacteria to your vagina.  Other things that may help prevent vaginosis include:  Marland Kitchen Don't douche. Your vagina doesn't require cleansing other than normal bathing. Repetitive douching disrupts the normal organisms that reside in the vagina and can actually increase your risk of vaginal infection. Douching won't clear up a vaginal infection. . Use a latex condom. Both female and female latex condoms may help you avoid infections spread by sexual contact. . Wear cotton underwear. Also wear pantyhose with a cotton crotch. If you feel comfortable without it, skip wearing underwear to bed. Yeast thrives in [employer] Your symptoms should improve in the next day or two.  GET HELP RIGHT AWAY IF:  . You have pain in your lower abdomen ( pelvic area or over your ovaries) . You develop nausea or vomiting . You  develop a fever . Your discharge changes or worsens . You have persistent pain with intercourse . You develop shortness of breath, a rapid pulse, or you faint.  These symptoms could be signs of problems or infections that need to be evaluated by a medical provider now.  MAKE SURE YOU    Understand these  instructions.  Will watch your condition.  Will get help right away if you are not doing well or get worse.  Your e-visit answers were reviewed by a board certified advanced clinical practitioner to complete your personal care plan. Depending upon the condition, your plan could have included both over the counter or prescription medications. Please review your pharmacy choice to make sure that you have choses a pharmacy that is open for you to pick up any needed prescription, Your safety is important to [place]. If you have drug allergies check your prescription carefully.   You can use MyChart to ask questions about today's visit, request a non-urgent call back, or ask for a work or school excuse for 24 hours related to this e-Visit. If it has been greater than 24 hours you will need to follow up with your provider, or enter a new e-Visit to address those concerns. You will get a MyChart message within the next two days asking about your experience. I hope that your e-visit has been valuable and will speed your recovery.

## 2018-04-19 DIAGNOSIS — Z91013 Allergy to seafood: Secondary | ICD-10-CM | POA: Diagnosis not present

## 2018-04-19 DIAGNOSIS — K5900 Constipation, unspecified: Secondary | ICD-10-CM | POA: Diagnosis not present

## 2018-04-19 DIAGNOSIS — I509 Heart failure, unspecified: Secondary | ICD-10-CM | POA: Diagnosis not present

## 2018-04-19 DIAGNOSIS — K8020 Calculus of gallbladder without cholecystitis without obstruction: Secondary | ICD-10-CM | POA: Diagnosis not present

## 2018-04-19 DIAGNOSIS — I110 Hypertensive heart disease with heart failure: Secondary | ICD-10-CM | POA: Diagnosis not present

## 2018-04-19 DIAGNOSIS — N83201 Unspecified ovarian cyst, right side: Secondary | ICD-10-CM | POA: Diagnosis not present

## 2018-04-19 DIAGNOSIS — Z79899 Other long term (current) drug therapy: Secondary | ICD-10-CM | POA: Diagnosis not present

## 2018-04-19 DIAGNOSIS — K5730 Diverticulosis of large intestine without perforation or abscess without bleeding: Secondary | ICD-10-CM | POA: Diagnosis not present

## 2018-04-19 DIAGNOSIS — R1032 Left lower quadrant pain: Secondary | ICD-10-CM | POA: Diagnosis not present

## 2018-05-30 ENCOUNTER — Inpatient Hospital Stay (HOSPITAL_COMMUNITY)
Admission: AD | Admit: 2018-05-30 | Discharge: 2018-05-30 | Disposition: A | Discharge status: Home / Self Care | Payer: BLUE CROSS/BLUE SHIELD | Attending: Obstetrics and Gynecology | Admitting: Obstetrics and Gynecology

## 2018-05-30 ENCOUNTER — Inpatient Hospital Stay (HOSPITAL_COMMUNITY): Payer: BLUE CROSS/BLUE SHIELD

## 2018-05-30 ENCOUNTER — Encounter (HOSPITAL_COMMUNITY): Payer: Self-pay | Admitting: *Deleted

## 2018-05-30 ENCOUNTER — Telehealth: Payer: Self-pay | Admitting: Certified Nurse Midwife

## 2018-05-30 DIAGNOSIS — N83201 Unspecified ovarian cyst, right side: Secondary | ICD-10-CM | POA: Diagnosis not present

## 2018-05-30 DIAGNOSIS — I2510 Atherosclerotic heart disease of native coronary artery without angina pectoris: Secondary | ICD-10-CM | POA: Insufficient documentation

## 2018-05-30 DIAGNOSIS — Z7982 Long term (current) use of aspirin: Secondary | ICD-10-CM | POA: Insufficient documentation

## 2018-05-30 DIAGNOSIS — I110 Hypertensive heart disease with heart failure: Secondary | ICD-10-CM | POA: Insufficient documentation

## 2018-05-30 DIAGNOSIS — K219 Gastro-esophageal reflux disease without esophagitis: Secondary | ICD-10-CM | POA: Diagnosis not present

## 2018-05-30 DIAGNOSIS — G4733 Obstructive sleep apnea (adult) (pediatric): Secondary | ICD-10-CM | POA: Insufficient documentation

## 2018-05-30 DIAGNOSIS — R1031 Right lower quadrant pain: Secondary | ICD-10-CM | POA: Insufficient documentation

## 2018-05-30 DIAGNOSIS — R110 Nausea: Secondary | ICD-10-CM | POA: Diagnosis not present

## 2018-05-30 DIAGNOSIS — I959 Hypotension, unspecified: Secondary | ICD-10-CM | POA: Diagnosis not present

## 2018-05-30 DIAGNOSIS — M109 Gout, unspecified: Secondary | ICD-10-CM | POA: Diagnosis not present

## 2018-05-30 DIAGNOSIS — I5042 Chronic combined systolic (congestive) and diastolic (congestive) heart failure: Secondary | ICD-10-CM | POA: Insufficient documentation

## 2018-05-30 DIAGNOSIS — Z79899 Other long term (current) drug therapy: Secondary | ICD-10-CM | POA: Diagnosis not present

## 2018-05-30 DIAGNOSIS — Z8249 Family history of ischemic heart disease and other diseases of the circulatory system: Secondary | ICD-10-CM | POA: Insufficient documentation

## 2018-05-30 DIAGNOSIS — N83209 Unspecified ovarian cyst, unspecified side: Secondary | ICD-10-CM

## 2018-05-30 DIAGNOSIS — E282 Polycystic ovarian syndrome: Secondary | ICD-10-CM | POA: Diagnosis not present

## 2018-05-30 DIAGNOSIS — R160 Hepatomegaly, not elsewhere classified: Secondary | ICD-10-CM | POA: Diagnosis not present

## 2018-05-30 LAB — COMPREHENSIVE METABOLIC PANEL
ALT: 28 U/L (ref 0–44)
AST: 20 U/L (ref 15–41)
Albumin: 4 g/dL (ref 3.5–5.0)
Alkaline Phosphatase: 39 U/L (ref 38–126)
Anion gap: 6 (ref 5–15)
BUN: 11 mg/dL (ref 6–20)
CO2: 25 mmol/L (ref 22–32)
Calcium: 9.1 mg/dL (ref 8.9–10.3)
Chloride: 106 mmol/L (ref 98–111)
Creatinine, Ser: 0.76 mg/dL (ref 0.44–1.00)
GFR calc Af Amer: >60 mL/min (ref >60)
GFR calc non Af Amer: >60 mL/min (ref >60)
Glucose, Bld: 88 mg/dL (ref 70–99)
Potassium: 4.9 mmol/L (ref 3.5–5.1)
Sodium: 137 mmol/L (ref 135–145)
Total Bilirubin: 0.9 mg/dL (ref 0.3–1.2)
Total Protein: 7.6 g/dL (ref 6.5–8.1)

## 2018-05-30 LAB — URINALYSIS, ROUTINE W REFLEX MICROSCOPIC
Bilirubin Urine: NEGATIVE
Glucose, UA: NEGATIVE mg/dL
Hgb urine dipstick: NEGATIVE
Ketones, ur: NEGATIVE mg/dL
Leukocytes, UA: NEGATIVE
Nitrite: NEGATIVE
Protein, ur: NEGATIVE mg/dL
Specific Gravity, Urine: 1.019 (ref 1.005–1.030)
pH: 5 (ref 5.0–8.0)

## 2018-05-30 LAB — CBC
HCT: 38.3 % (ref 36.0–46.0)
Hemoglobin: 12.4 g/dL (ref 12.0–15.0)
MCH: 29.4 pg (ref 26.0–34.0)
MCHC: 32.4 g/dL (ref 30.0–36.0)
MCV: 90.8 fL (ref 80.0–100.0)
Platelets: 242 10*3/uL (ref 150–400)
RBC: 4.22 MIL/uL (ref 3.87–5.11)
RDW: 14.6 % (ref 11.5–15.5)
WBC: 6.9 10*3/uL (ref 4.0–10.5)
nRBC: 0 % (ref 0.0–0.2)

## 2018-05-30 LAB — WET PREP, GENITAL
Clue Cells Wet Prep HPF POC: NONE SEEN
Sperm: NONE SEEN
Trich, Wet Prep: NONE SEEN
Yeast Wet Prep HPF POC: NONE SEEN

## 2018-05-30 LAB — POCT PREGNANCY, URINE: Preg Test, Ur: NEGATIVE

## 2018-05-30 MED ORDER — KETOROLAC TROMETHAMINE 60 MG/2ML IM SOLN
60.0000 mg | Freq: Once | INTRAMUSCULAR | Status: AC
  Administered 2018-05-30: 60 mg via INTRAMUSCULAR
  Filled 2018-05-30: qty 2

## 2018-05-30 NOTE — MAU Provider Note (Signed)
History     CSN: [id number]  Arrival date and time: 05/30/18 1214   First Provider Initiated Contact with Patient 05/30/18 1313      Chief Complaint  Patient presents with  . Abdominal Pain  . Nausea   HPI Renee Roberson 37 y.o.   Client of Dr. Rogue Bussing. Having right sided pain for several months but worse for the last 6 weeks.  Yesterday and today was particularly worse.  Periodically has to lie in bed in fetal position.  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  Had to take periodic breaks and go lie down.  Called the office and was told to come to MAU.  Has not yet eaten today.  Having periodic nausea but no vomiting today or yesterday.  Took one tylenol yesterday and it did not help the pain.  Has not taken any medication today.  No vaginal bleeding.  Was seen for this pain at ER in [place] in mid-November and had a CT scan then.  Ovarian cyst was seen and it was larger than identified cyst in 2017.  Currently has a Mirena IUD and has minimal spotting at the end of every month.  Reviewed note in Care Everywhere.  Has history of hypertension and PCOS.  OB History    Gravida  1   Para  0   Term  0   Preterm  0   AB  1   Living  0     SAB  1   TAB      Ectopic      Multiple      Live Births              Past Medical History:  Diagnosis Date  . Abnormal Pap smear of cervix   . Accelerated essential hypertension 03/20/2013   Left ventricular systolic dysfunction as a complication   . Amenorrhea   . CHF (congestive heart failure) ([place])   . Chronic combined systolic and diastolic heart failure ([place]) 11/13/2013   LVEF improved from 25% to 45% after 6 months of antihypertensive therapy   . Coronary artery disease   . Daytime sleepiness 08/17/2016  . Depression   . Diverticulitis of large intestine 12/30/2015  . GERD (gastroesophageal reflux disease)   . Gout   . Hepatic steatosis   . Hepatomegaly   . HTN (hypertension)   . Hypotension 08/20/2014  .  Infertility, female   . Migraine   . Morbid obesity ([place]) 03/20/2013  . OSA on CPAP 02/21/2018   Home sleep study showed severe obstructive sleep apnea with an AHI of 62.3 on night 1 and 92.0 and night to with oxygen desaturations less than 70% for 1 minute.  A total of 1 hour and 22 minutes was noted with O2 sats less than 89%.  52% of the time was spent with oxygen saturations less than 90%. Now on CPAP at 14 cm H2O   . PCOS (polycystic ovarian syndrome)   . Snoring 08/17/2016  . STD (sexually transmitted disease)    HSV (genital)  . Systolic heart failure [facility])     Past Surgical History:  Procedure Laterality Date  . COLPOSCOPY  2015  . no surgical hx      Family History  Problem Relation Age of Onset  . Hypertension Mother   . Hypertension Father   . Thyroid disease Father   . Hypertension Sister   . Cancer Maternal Grandmother        ovarian or  colon  . Diabetes Paternal Grandfather     Social History   Tobacco Use  . Smoking status: Never Smoker  . Smokeless tobacco: Never Used  Substance Use Topics  . Alcohol use: No    Alcohol/week: 0.0 standard drinks  . Drug use: No    Allergies:  Allergies  Allergen Reactions  . Fish Allergy Anaphylaxis, Hives and Swelling    Medications Prior to Admission  Medication Sig Dispense Refill Last Dose  . amLODipine-benazepril (LOTREL) 5-40 MG capsule Take 1 capsule by mouth daily. Please keep upcoming appt for future refills. Thank you 90 capsule 3 Taking  . aspirin-acetaminophen-caffeine (EXCEDRIN MIGRAINE) 250-250-65 MG tablet Take 1 tablet by mouth every 6 (six) hours as needed for headache or migraine.    Taking  . carvedilol (COREG) 25 MG tablet Take 1 tablet (25 mg total) by mouth 2 (two) times daily with a meal. Please keep upcoming appt for future refills. Thank you 90 tablet 3 Taking  . furosemide (LASIX) 40 MG tablet Take 1 tablet (40 mg total) by mouth daily. Please keep upcoming appt for future refills. Thank you 90  tablet 3 Taking  . metroNIDAZOLE (FLAGYL) 500 MG tablet Take 1 tablet (500 mg total) by mouth 2 (two) times daily. 14 tablet 0   . omeprazole (PRILOSEC) 40 MG capsule Take 40 mg by mouth as needed (heartburn).   4 Taking    Review of Systems  Constitutional: Negative for fever.  Gastrointestinal: Positive for abdominal pain and nausea. Negative for vomiting.  Genitourinary: Negative for dysuria, vaginal discharge and vaginal pain.   Physical Exam   Blood pressure 126/72, pulse 76, temperature 98.5 F (36.9 C), temperature source Oral, resp. rate 18, height 5\' 5"  (1.651 m), weight 109.8 kg, SpO2 99 %.  Physical Exam  Nursing note and vitals reviewed. Constitutional: She is oriented to person, place, and time. She appears well-developed and well-nourished.  HENT:  Head: Normocephalic.  Eyes: EOM are normal.  Neck: Neck supple.  GI: Soft. There is no rebound and no guarding.  Bowel sounds decreased RLQ tenderness  Genitourinary:    Genitourinary Comments: Speculum exam: Vagina - Small amount of white discharge, no odor Cervix - No contact bleeding Bimanual exam: Cervix closed Uterus  tender, Unable to size due to habitus, Adnexa relatively non tender GC/Chlam, wet prep done Chaperone present for exam.    Musculoskeletal: Normal range of motion.  Neurological: She is alert and oriented to person, place, and time.  Skin: Skin is warm and dry.  Psychiatric: She has a normal mood and affect.    MAU Course  Procedures  Results for orders placed or performed during the hospital encounter of 05/30/18 (from the past 24 hour(s))  Urinalysis, Routine w reflex microscopic     Status: Abnormal   Collection Time: 05/30/18 12:51 PM  Result Value Ref Range   Color, Urine YELLOW YELLOW   APPearance HAZY (A) CLEAR   Specific Gravity, Urine 1.019 1.005 - 1.030   pH 5.0 5.0 - 8.0   Glucose, UA NEGATIVE NEGATIVE mg/dL   Hgb urine dipstick NEGATIVE NEGATIVE   Bilirubin Urine NEGATIVE  NEGATIVE   Ketones, ur NEGATIVE NEGATIVE mg/dL   Protein, ur NEGATIVE NEGATIVE mg/dL   Nitrite NEGATIVE NEGATIVE   Leukocytes, UA NEGATIVE NEGATIVE  Pregnancy, urine POC     Status: None   Collection Time: 05/30/18 12:55 PM  Result Value Ref Range   Preg Test, Ur NEGATIVE NEGATIVE  CBC  Status: None   Collection Time: 05/30/18  1:35 PM  Result Value Ref Range   WBC 6.9 4.0 - 10.5 K/uL   RBC 4.22 3.87 - 5.11 MIL/uL   Hemoglobin 12.4 12.0 - 15.0 g/dL   HCT 38.3 36.0 - 46.0 %   MCV 90.8 80.0 - 100.0 fL   MCH 29.4 26.0 - 34.0 pg   MCHC 32.4 30.0 - 36.0 g/dL   RDW 14.6 11.5 - 15.5 %   Platelets 242 150 - 400 K/uL   nRBC 0.0 0.0 - 0.2 %  Comprehensive metabolic panel     Status: None   Collection Time: 05/30/18  1:35 PM  Result Value Ref Range   Sodium 137 135 - 145 mmol/L   Potassium 4.9 3.5 - 5.1 mmol/L   Chloride 106 98 - 111 mmol/L   CO2 25 22 - 32 mmol/L   Glucose, Bld 88 70 - 99 mg/dL   BUN 11 6 - 20 mg/dL   Creatinine, Ser 0.76 0.44 - 1.00 mg/dL   Calcium 9.1 8.9 - 10.3 mg/dL   Total Protein 7.6 6.5 - 8.1 g/dL   Albumin 4.0 3.5 - 5.0 g/dL   AST 20 15 - 41 U/L   ALT 28 0 - 44 U/L   Alkaline Phosphatase 39 38 - 126 U/L   Total Bilirubin 0.9 0.3 - 1.2 mg/dL   GFR calc non Af Amer >60 >60 mL/min   GFR calc Af Amer >60 >60 mL/min   Anion gap 6 5 - 15  Wet prep, genital     Status: Abnormal   Collection Time: 05/30/18  3:06 PM  Result Value Ref Range   Yeast Wet Prep HPF POC NONE SEEN NONE SEEN   Trich, Wet Prep NONE SEEN NONE SEEN   Clue Cells Wet Prep HPF POC NONE SEEN NONE SEEN   WBC, Wet Prep HPF POC FEW (A) NONE SEEN   Sperm NONE SEEN      CLINICAL DATA:  Right ovarian cyst  EXAM: TRANSABDOMINAL AND TRANSVAGINAL ULTRASOUND OF PELVIS  TECHNIQUE: Both transabdominal and transvaginal ultrasound examinations of the pelvis were performed. Transabdominal technique was performed for global imaging of the pelvis including uterus, ovaries, adnexal regions,  and pelvic cul-de-sac. It was necessary to proceed with endovaginal exam following the transabdominal exam to visualize the .  COMPARISON:  CT 09/23/2015.  Ultrasound 09/05/2015  FINDINGS: Uterus  Measurements: 8.1 x 4.4 x 4.8 cm = volume: 88 mL. No fibroids or other mass visualized.  Endometrium  Thickness: 4 mm in thickness.  IUD in place.  Right ovary  Measurements: 5.4 x 5.4 x 5.7 cm = volume: 86 mL. 4.9 x 4.2 x 4.1 cm simple appearing cystic mass within the right ovary.  Left ovary  Measurements: 3.4 x 2.9 x 3.1 cm = volume: 16 mL. Normal appearance/no adnexal mass.  Other findings  No abnormal free fluid.  IMPRESSION: Simple appearing 4.9 cm right ovarian cyst. This has benign characteristics. No imaging follow up is required for postmenopausal females. This follows consensus guidelines: Simple Adnexal Cysts: SRU Consensus Conference Update on Follow-up and Reporting. Radiology 2019; [id number].  MDM Not pregnant.  Will give Toradol for pain today.  Inadequate pain management at home.  Tearful after ultrasound - pain has increased with pushing on abdomen and with walking back to from ultrasound.  Toradol IM relieved most of her pain.  Now sitting dressed on the bed and ready to go home.  Advised to get ibuprofen prescription she  has filled and take around the clock for a few days.  Seems to have more pain with uterus on exam than with the right side. Will follow up and contact the office for an appointment - will be a new patient.  Assessment and Plan  RLQ pain Right ovarian cyst  Plan Discussed pain management as she has not been taking medication at home. Will make an appointment to follow up in the office. Has prescription for ibuprofen at home and will get it filled. Advised to seek additional medical care if you develop fever, vomiting, diarrhea or worsening abdominal pain.  Janney Priego L Elanie Hammitt 05/30/2018, 1:24 PM

## 2018-05-30 NOTE — Discharge Instructions (Signed)
Get your ibuprofen filled and take as directed.  Take one dose before bed tonight.  Take with food or a couple of crackers. Make an appointment for follow up in the office. Seek additional medical care if you have worsening abdominal pain, fever diarrhea or vomiting. Drink at least 8 8-oz glasses of water every day.

## 2018-05-30 NOTE — Telephone Encounter (Signed)
Please send to the [facility] for evaluation.  She likely needs a pelvic ultrasound.

## 2018-05-30 NOTE — Telephone Encounter (Signed)
Spoke with patient. Patient reports throbbing, sharp pain in lower right abdomen and pelvis, has been present and intermittent since 04/2018. Seen in ER on 11/19, CT completed. Patient reports pain is currently 8/10, not relieved by tylenol prn. Unable to take motrin , causes GI symptoms. Pain is worse when bladder and stomach is full. Denies vomiting, reports nausea with pain. Denies fever/chills, urinary symptoms or irregular vaginal bleeding or d/c. IUD for contraceptive.  Advised will review with provider and return call. Patient agreeable.   CT abdomen/pelvis [place] 04/19/18-. Mildly complex cystic lesion in the right adnexa measuring up to 5.5 x 4.1 cm, previously 4.4 x 3.2 cm and 2017. This is indeterminate. Follow-up pelvic ultrasound at a minimum is recommended.  Last AEX 08/12/17 DL  Routing to Dr. Quincy Simmonds.

## 2018-05-30 NOTE — Telephone Encounter (Signed)
Spoke with patient. Advised as seen below per Dr. Quincy Simmonds. Provided patient with instructions/directions to [facility] MAU, questions answered. Patient verbalizes understanding and is agreeable.   Encounter closed.

## 2018-05-30 NOTE — Telephone Encounter (Signed)
Patient called and stated that she was seen in the ED at the end of November or early December. Patient stated that they did see a cyst that was getting larger. Patient stated that she now has the same pain again on the right side "in her ovaries."

## 2018-05-30 NOTE — MAU Note (Signed)
Pt reports sharp pains on her right side for > a month, seen in ED on 11/19, pain worsening for the last week. Some nausea and chills at times.

## 2018-05-31 LAB — HIV ANTIBODY (ROUTINE TESTING W REFLEX): HIV Screen 4th Generation wRfx: NONREACTIVE

## 2018-05-31 LAB — RPR: RPR Ser Ql: NONREACTIVE

## 2018-06-03 LAB — GC/CHLAMYDIA PROBE AMP (~~LOC~~) NOT AT ARMC
Chlamydia: NEGATIVE
Neisseria Gonorrhea: NEGATIVE

## 2018-07-18 ENCOUNTER — Telehealth: Payer: BLUE CROSS/BLUE SHIELD | Admitting: Physician Assistant

## 2018-07-18 ENCOUNTER — Encounter: Payer: Self-pay | Admitting: Physician Assistant

## 2018-07-18 DIAGNOSIS — H109 Unspecified conjunctivitis: Secondary | ICD-10-CM

## 2018-07-18 DIAGNOSIS — H538 Other visual disturbances: Secondary | ICD-10-CM

## 2018-07-18 NOTE — Progress Notes (Signed)
Based on what you shared with me it looks like your conditionshould be evaluated in a face to face office visit.   Renee Roberson,  It is best that you have a face to face evaluation for your eye. Your symptoms suggest that you may have pink eye, however, pink eye does not generally cause blurred vision. Due to the recent injury you have, and your application of the cream close to the eye, I think it is right to have an eye examination done to further assess your vision and eye. Also, you indicated you may be pregnant and due to your history of wearing contact lenses, I will not be able to prescribe the antibiotics drops of choice in contact lens wearers. A face to face evaluation could also include a pregnancy test, if you would like, t to make sure the correct drops are provided for you.P lease let me know if you have questions or concerns about this.    NOTE: If you entered your credit card information for this eVisit, you will not be charged. You may see a "hold" on your card for the $30 but that hold will drop off and you will not have a charge processed.  If you are having a true medical emergency please call 911.  If you need an urgent face to face visit, [place] has four urgent care centers for your convenience.  If you need care fast and have a high deductible or no insurance consider:   [email or web address] to reserve your spot online an avoid wait times  [facility] [address], [address] [place], [place] [address] 8 am to 8 pm Monday-Friday 10 am to 4 pm Saturday-Sunday *Across the street from [employer]  [place], [address] 8 am to 5 pm Monday-Friday * In the [facility] on the [facility]   The following sites will take your  insurance:  . [facility] Urgent [place] a Provider at this Location  [address] [place], [place] [address] . 10 am to  8 pm Monday-Friday . 12 pm to 8 pm Saturday-Sunday   . [facility] Urgent [facility] at [place] a Provider at this Location  [address], [place], [place] [address] . 8 am to 8 pm Monday-Friday . 9 am to 6 pm Saturday . 11 am to 6 pm Sunday   . [facility] at [place] Get Driving Directions  [address] Suite [place], [place] [address] . 8 am to 8 pm Monday-Friday . 8 am to 4 pm Saturday-Sunday   Your e-visit answers were reviewed by a board certified advanced clinical practitioner to complete your personal care plan.  Thank you for using e-Visits.  I have spent 7 minutes in review of this chart- Renee Roberson [facility]

## 2018-07-20 DIAGNOSIS — Z3202 Encounter for pregnancy test, result negative: Secondary | ICD-10-CM | POA: Diagnosis not present

## 2018-07-29 DIAGNOSIS — Z6841 Body Mass Index (BMI) 40.0 and over, adult: Secondary | ICD-10-CM | POA: Diagnosis not present

## 2018-07-29 DIAGNOSIS — Z1151 Encounter for screening for human papillomavirus (HPV): Secondary | ICD-10-CM | POA: Diagnosis not present

## 2018-07-29 DIAGNOSIS — Z124 Encounter for screening for malignant neoplasm of cervix: Secondary | ICD-10-CM | POA: Diagnosis not present

## 2018-07-29 DIAGNOSIS — Z30432 Encounter for removal of intrauterine contraceptive device: Secondary | ICD-10-CM | POA: Diagnosis not present

## 2018-07-29 DIAGNOSIS — Z01419 Encounter for gynecological examination (general) (routine) without abnormal findings: Secondary | ICD-10-CM | POA: Diagnosis not present

## 2018-07-29 DIAGNOSIS — Z113 Encounter for screening for infections with a predominantly sexual mode of transmission: Secondary | ICD-10-CM | POA: Diagnosis not present

## 2018-08-17 ENCOUNTER — Ambulatory Visit: Payer: BLUE CROSS/BLUE SHIELD | Admitting: Certified Nurse Midwife

## 2018-10-01 ENCOUNTER — Other Ambulatory Visit: Payer: Self-pay | Admitting: Interventional Cardiology

## 2018-10-19 ENCOUNTER — Other Ambulatory Visit: Payer: Self-pay | Admitting: Nurse Practitioner

## 2018-11-09 DIAGNOSIS — R42 Dizziness and giddiness: Secondary | ICD-10-CM | POA: Diagnosis not present

## 2018-11-09 DIAGNOSIS — I110 Hypertensive heart disease with heart failure: Secondary | ICD-10-CM | POA: Diagnosis not present

## 2018-11-09 DIAGNOSIS — R110 Nausea: Secondary | ICD-10-CM | POA: Diagnosis not present

## 2019-03-20 ENCOUNTER — Other Ambulatory Visit: Payer: Self-pay | Admitting: Nurse Practitioner

## 2019-03-24 ENCOUNTER — Telehealth: Payer: Self-pay | Admitting: *Deleted

## 2019-03-24 NOTE — Telephone Encounter (Signed)
Virtual Visit Pre-Appointment Phone Call  TELEPHONE CALL NOTE  LULABELL BACIGALUPI has been deemed a candidate for a follow-up tele-health visit to limit community exposure during the Covid-19 pandemic. I spoke with the patient via phone to ensure availability of phone/video source, confirm preferred email & phone number, and discuss instructions and expectations.  I reminded NORETA MCNICHOLAS to be prepared with any vital sign and/or heart rhythm information that could potentially be obtained via home monitoring, at the time of her visit. I reminded AZAIRA SHEERIN to expect a phone call prior to her visit.   Patient agrees to consent below.  Sheralyn Boatman, [place] 03/24/2019 4:58 PM   FULL LENGTH CONSENT FOR TELE-HEALTH VISIT   I hereby voluntarily request, consent and authorize [facility] and its employed or contracted physicians, physician assistants, nurse practitioners or other licensed health care professionals (the Practitioner), to provide me with telemedicine health care services (the "Services") as deemed necessary by the treating Practitioner. I acknowledge and consent to receive the Services by the Practitioner via telemedicine. I understand that the telemedicine visit will involve communicating with the Practitioner through live audiovisual communication technology and the disclosure of certain medical information by electronic transmission. I acknowledge that I have been given the opportunity to request an in-person assessment or other available alternative prior to the telemedicine visit and am voluntarily participating in the telemedicine visit.  I understand that I have the right to withhold or withdraw my consent to the use of telemedicine in the course of my care at any time, without affecting my right to future care or treatment, and that the Practitioner or I may terminate the telemedicine visit at any time. I understand that I have the right to inspect all  information obtained and/or recorded in the course of the telemedicine visit and may receive copies of available information for a reasonable fee.  I understand that some of the potential risks of receiving the Services via telemedicine include:  Marland Kitchen Delay or interruption in medical evaluation due to technological equipment failure or disruption; . Information transmitted may not be sufficient (e.g. poor resolution of images) to allow for appropriate medical decision making by the Practitioner; and/or  . In rare instances, security protocols could fail, causing a breach of personal health information.  Furthermore, I acknowledge that it is my responsibility to provide information about my medical history, conditions and care that is complete and accurate to the best of my ability. I acknowledge that Practitioner's advice, recommendations, and/or decision may be based on factors not within their control, such as incomplete or inaccurate data provided by me or distortions of diagnostic images or specimens that may result from electronic transmissions. I understand that the practice of medicine is not an exact science and that Practitioner makes no warranties or guarantees regarding treatment outcomes. I acknowledge that I will receive a copy of this consent concurrently upon execution via email to the email address I last provided but may also request a printed copy by calling the office of [place].    I understand that my insurance will be billed for this visit.   I have read or had this consent read to me. . I understand the contents of this consent, which adequately explains the benefits and risks of the Services being provided via telemedicine.  . I have been provided ample opportunity to ask questions regarding this consent and the Services and have had my questions answered to my satisfaction. . I give  my informed consent for the services to be provided through the use of telemedicine in my  medical care  By participating in this telemedicine visit I agree to the above.

## 2019-03-26 NOTE — Progress Notes (Signed)
Virtual Visit via Telephone Note   This visit type was conducted due to national recommendations for restrictions regarding the COVID-19 Pandemic (e.g. social distancing) in an effort to limit this patient's exposure and mitigate transmission in our community.  Due to her co-morbid illnesses, this patient is at least at moderate risk for complications without adequate follow up.  This format is felt to be most appropriate for this patient at this time.  The patient did not have access to video technology/had technical difficulties with video requiring transitioning to audio format only (telephone).  All issues noted in this document were discussed and addressed.  No physical exam could be performed with this format.  Please refer to the patient's chart for her  consent to telehealth for [facility].   Date:  03/27/2019   ID:  Renee Roberson, DOB July 30, 1980, MRN [id number]  Patient Location: Home Provider Location: Home  PCP:  Maury Dus, MD  Cardiologist:  [name], MD / Truitt Merle, NP  Electrophysiologist:  None  Sleep Medicine:  Fransico Him, MD    Evaluation Performed:  Follow-Up Visit  Chief Complaint:  Chest pain, palpitations, dizziness   History of Present Illness:    Renee Roberson is a 37 y.o. female with    Hypertensive Heart Disease  HFrEF w/ return of normal LVEF  EF 25 in 2014  EF 40-45 in 2015  Echocardiogram 2018: EF 60-65  PCOS  Chest pain   OSA on CPAP   Obesity   She was last seen by Truitt Merle, NP in 12/2017.  Today, she notes several symptoms she has had over the past couple of months.  She has been experiencing palpitations shortly after awakening.  These seem to improve after taking carvedilol.  She does not have them every day.  She is not certain of a trigger.  She has noted chest tightness with going on walks.  She tries to walk about 1 to 2 miles.  The tightness in her chest does cause her to stop walking.  Her symptoms  improve with rest.  She has had radiation to both arms.  She describes the symptoms in her arms as tingling.  She does feel sweaty with this but has not had nausea.  She does note shortness of breath with exertion.  She has gained about 12 pounds since last year.  She wonders if her symptoms are related to weight gain.  Shortly after walking, she does have some dizziness.  She is not certain if she has had near syncope.  She denies frank syncope.  She has not had orthopnea, lower extremity swelling.  She sleeps with CPAP.  The patient does not have symptoms concerning for COVID-19 infection (fever, chills, cough, or new shortness of breath).    Past Medical History:  Diagnosis Date  . Abnormal Pap smear of cervix   . Accelerated essential hypertension 03/20/2013   Left ventricular systolic dysfunction as a complication   . Amenorrhea   . CHF (congestive heart failure) ([place])   . Chronic combined systolic and diastolic heart failure ([place]) 11/13/2013   LVEF improved from 25% to 45% after 6 months of antihypertensive therapy   . Coronary artery disease   . Daytime sleepiness 08/17/2016  . Depression   . Diverticulitis of large intestine 12/30/2015  . GERD (gastroesophageal reflux disease)   . Gout   . Hepatic steatosis   . Hepatomegaly   . HTN (hypertension)   . Hypotension 08/20/2014  .  Infertility, female   . Migraine   . Morbid obesity ([place]) 03/20/2013  . OSA on CPAP 02/21/2018   Home sleep study showed severe obstructive sleep apnea with an AHI of 62.3 on night 1 and 92.0 and night to with oxygen desaturations less than 70% for 1 minute.  A total of 1 hour and 22 minutes was noted with O2 sats less than 89%.  52% of the time was spent with oxygen saturations less than 90%. Now on CPAP at 14 cm H2O   . PCOS (polycystic ovarian syndrome)   . Snoring 08/17/2016  . STD (sexually transmitted disease)    HSV (genital)  . Systolic heart failure [facility])    Past Surgical History:  Procedure  Laterality Date  . COLPOSCOPY  2015  . no surgical hx       Current Meds  Medication Sig  . acetaminophen (TYLENOL) 500 MG tablet Take 500 mg by mouth every 6 (six) hours as needed for mild pain.  Marland Kitchen amLODipine-benazepril (LOTREL) 5-40 MG capsule Take 1 capsule by mouth daily. 3  . carvedilol (COREG) 25 MG tablet Take 1 tablet (25 mg total) by mouth 2 (two) times daily with a meal.  . furosemide (LASIX) 40 MG tablet Take 1 tablet (40 mg total) by mouth daily.  Marland Kitchen omeprazole (PRILOSEC) 40 MG capsule Take 40 mg by mouth daily.  . [DISCONTINUED] amLODipine-benazepril (LOTREL) 5-40 MG capsule Take 1 capsule by mouth daily. Patient needs appointment for any future refills. Please call office at [phone] to schedule appointment.  . [DISCONTINUED] carvedilol (COREG) 25 MG tablet Take 1 tablet (25 mg total) by mouth 2 (two) times daily with a meal.  . [DISCONTINUED] furosemide (LASIX) 40 MG tablet Take 1 tablet (40 mg total) by mouth daily. Please keep upcoming appt for future refills. Thank you     Allergies:   Fish allergy   Social History   Tobacco Use  . Smoking status: Never Smoker  . Smokeless tobacco: Never Used  Substance Use Topics  . Alcohol use: No    Alcohol/week: 0.0 standard drinks  . Drug use: No     Family Hx: The patient's family history includes Cancer in her maternal grandmother; Diabetes in her paternal grandfather; Hypertension in her father, mother, and sister; Thyroid disease in her father.  ROS:   Please see the history of present illness.    She has not had fever, cough, vomiting, diarrhea, bleeding issues. All other systems reviewed and are negative.   Prior CV studies:   The following studies were reviewed today:  Echocardiogram 01/05/17 Mild conc LVH, EF 60-65, no RWMA, Gr 2 DD, mild LAE  ETT 07/03/14  normal - no evidence of ischemia by ST analysis  Echocardiogram 08/29/13 Mild conc LVH, EF 40, no RWMA, Gr 1 DD, trivial MR    Labs/Other Tests  and Data Reviewed:    EKG:  No ECG reviewed.  Recent Labs: 05/30/2018: ALT 28; BUN 11; Creatinine, Ser 0.76; Hemoglobin 12.4; Platelets 242; Potassium 4.9; Sodium 137   Recent Lipid Panel No results found for: CHOL, TRIG, HDL, CHOLHDL, LDLCALC, LDLDIRECT       Wt Readings from Last 3 Encounters:  03/27/19 249 lb (112.9 kg)  05/30/18 242 lb (109.8 kg)  02/21/18 237 lb 6.4 oz (107.7 kg)     Objective:    Vital Signs:  Ht 5\' 5"  (1.651 m)   Wt 249 lb (112.9 kg)   BMI 41.44 kg/m    VITAL SIGNS:  reviewed GEN:  no acute distress RESPIRATORY:  No labored breathing NEURO:  Alert and oriented PSYCH:  Normal mood  ASSESSMENT & PLAN:    1. Exertional chest pain 2. Shortness of breath The patient notes significant symptoms of exertional chest discomfort described as tightness with associated shortness of breath and arm discomfort over the past 1 to 2 months.  She lacks significant risk factors for coronary artery disease.  However, her symptoms are suggestive of angina.  We discussed the limitations of telemedicine today which includes the inability to do a physical exam as well as the inability to obtain an EKG.  As she does not have significant risk factors for coronary disease, I have recommended that we proceed with coronary artery CTA to rule out significant CAD.  However, I would like her to get an EKG in the next 1 to 2 days.  If this demonstrates significant ischemic changes, then she will need to undergo cardiac catheterization instead of coronary CTA.  -Arrange coronary artery CTA in the next 1 to 2 weeks  -Obtain BMET, BNP, CBC  -If BNP significantly elevated, adjust Lasix and obtain echocardiogram  -Obtain EKG  -If EKG is significantly abnormal, cancel CTA and arrange cardiac catheterization  -FU with Truitt Merle, NP, Dr. Tamala Julian or me after her CTA in 2-3 weeks  3. Palpitations She notes symptoms of palpitations mainly in the mornings prior to taking her medications.   Etiology is not entirely clear.  As noted above, I will arrange coronary CTA to rule out significant CAD.  If her CTA is normal, consider proceeding with an event monitor to better assess her palpitations.  4. Dizziness Etiology of her dizziness is not entirely clear.  She has noted weight gain over the past several months.  Her increased weight may certainly explain all of her symptoms.  However, she needs further work-up to identify other etiologies.  Arrange coronary artery CTA as outlined above.  If this is normal and she continues to have symptoms, consider event monitor.  5. Hypertensive heart disease with chronic systolic congestive heart failure ([place]) She was not able to obtain a blood pressure reading today.  We will provide her with a blood pressure cuff so that she can monitor her blood pressure at home.  To new current therapy.  6. HFrEF (heart failure with reduced ejection fraction) (HCC) EF has been as low as 25% in the past but improved to normal with good blood pressure control.  Most recent echocardiogram in 2018 demonstrated normal LV function.  As noted above, if her BNP is significantly elevated, she will need a repeat echocardiogram to reassess her LV function.  7. OSA on CPAP Continue CPAP.     Time:   Today, I have spent 31 minutes with the patient with telehealth technology discussing the above problems.     Medication Adjustments/Labs and Tests Ordered: Current medicines are reviewed at length with the patient today.  Concerns regarding medicines are outlined above.   Tests Ordered: No orders of the defined types were placed in this encounter.   Medication Changes: Meds ordered this encounter  Medications  . furosemide (LASIX) 40 MG tablet    Sig: Take 1 tablet (40 mg total) by mouth daily.    Dispense:  90 tablet    Refill:  3  . carvedilol (COREG) 25 MG tablet    Sig: Take 1 tablet (25 mg total) by mouth 2 (two) times daily with a meal.    Dispense:  180  tablet    Refill:  3  . amLODipine-benazepril (LOTREL) 5-40 MG capsule    Sig: Take 1 capsule by mouth daily. 3    Dispense:  90 capsule    Refill:  3    Follow Up:  In Person in 2 week(s)  Signed, Richardson Dopp, PA-C  03/27/2019 1:43 PM    [facility]

## 2019-03-27 ENCOUNTER — Other Ambulatory Visit: Payer: Self-pay

## 2019-03-27 ENCOUNTER — Telehealth (INDEPENDENT_AMBULATORY_CARE_PROVIDER_SITE_OTHER): Payer: BC Managed Care – PPO | Admitting: Physician Assistant

## 2019-03-27 ENCOUNTER — Encounter: Payer: Self-pay | Admitting: *Deleted

## 2019-03-27 VITALS — Ht 65.0 in | Wt 249.0 lb

## 2019-03-27 DIAGNOSIS — R42 Dizziness and giddiness: Secondary | ICD-10-CM

## 2019-03-27 DIAGNOSIS — G4733 Obstructive sleep apnea (adult) (pediatric): Secondary | ICD-10-CM

## 2019-03-27 DIAGNOSIS — R002 Palpitations: Secondary | ICD-10-CM

## 2019-03-27 DIAGNOSIS — R0602 Shortness of breath: Secondary | ICD-10-CM

## 2019-03-27 DIAGNOSIS — Z9989 Dependence on other enabling machines and devices: Secondary | ICD-10-CM

## 2019-03-27 DIAGNOSIS — R079 Chest pain, unspecified: Secondary | ICD-10-CM | POA: Diagnosis not present

## 2019-03-27 DIAGNOSIS — I5020 Unspecified systolic (congestive) heart failure: Secondary | ICD-10-CM

## 2019-03-27 DIAGNOSIS — I110 Hypertensive heart disease with heart failure: Secondary | ICD-10-CM

## 2019-03-27 MED ORDER — CARVEDILOL 25 MG PO TABS: 25.0000 mg | ORAL_TABLET | Freq: Two times a day (BID) | ORAL | 3 refills | Status: DC

## 2019-03-27 MED ORDER — METOPROLOL TARTRATE 100 MG PO TABS: ORAL_TABLET | ORAL | 0 refills | Status: DC

## 2019-03-27 MED ORDER — ASPIRIN EC 81 MG PO TBEC: 81.0000 mg | DELAYED_RELEASE_TABLET | Freq: Every day | ORAL | 3 refills | Status: DC

## 2019-03-27 MED ORDER — FUROSEMIDE 40 MG PO TABS: 40.0000 mg | ORAL_TABLET | Freq: Every day | ORAL | 3 refills | Status: DC

## 2019-03-27 MED ORDER — AMLODIPINE BESY-BENAZEPRIL HCL 5-40 MG PO CAPS: 1.0000 | ORAL_CAPSULE | Freq: Every day | ORAL | 3 refills | Status: DC

## 2019-03-27 NOTE — Patient Instructions (Addendum)
Medication Instructions:  Your physician has recommended you make the following change in your medication:  Start ASA 81 mg once daily   *If you need a refill on your cardiac medications before your next appointment, please call your pharmacy*  Lab Work: Your physician recommends that you return for lab work . -BMET, CBC, BNP --To be done on October 27,2020   If you have labs (blood work) drawn today and your tests are completely normal, you will receive your results only by: Marland Kitchen MyChart Message (if you have MyChart) OR . A paper copy in the mail If you have any lab test that is abnormal or we need to change your treatment, we will call you to review the results.  Testing/Procedures: EKG to be done when in office for lab work--This will be scanned in your chart for Richardson Dopp, PA to review--Scheduled for October 27,2020 at 11:00  Your physician has requested that you have cardiac CT. Cardiac computed tomography (CT) is a painless test that uses an x-ray machine to take clear, detailed pictures of your heart. For further information please visit [facility]. Please follow instruction sheet as given.--To be done in next 1-2 weeks. -    Follow-Up: Your physician recommends that you schedule a follow-up appointment in: 2 weeks (after CT scan) with Truitt Merle, NP, Richardson Dopp, PA or Dr Tamala Julian. --Scheduled for November 18,2020 at 9:00 with Truitt Merle, NP   Other Instructions Check your blood pressure once daily and bring the readings to your next office visit.  You will be given a blood pressure cuff when you are in office for EKG and lab work.

## 2019-03-27 NOTE — Addendum Note (Signed)
Addended by: Thompson Grayer on: 03/27/2019 05:35 PM   Modules accepted: Orders

## 2019-03-28 ENCOUNTER — Other Ambulatory Visit: Payer: Self-pay | Admitting: Nurse Practitioner

## 2019-03-28 ENCOUNTER — Other Ambulatory Visit: Payer: Self-pay | Admitting: Physician Assistant

## 2019-03-28 ENCOUNTER — Other Ambulatory Visit: Payer: Self-pay

## 2019-03-28 ENCOUNTER — Other Ambulatory Visit: Payer: BC Managed Care – PPO

## 2019-03-28 ENCOUNTER — Ambulatory Visit (INDEPENDENT_AMBULATORY_CARE_PROVIDER_SITE_OTHER): Payer: BC Managed Care – PPO

## 2019-03-28 VITALS — BP 122/90 | HR 87 | Ht 65.0 in | Wt 243.0 lb

## 2019-03-28 DIAGNOSIS — R002 Palpitations: Secondary | ICD-10-CM

## 2019-03-28 DIAGNOSIS — R42 Dizziness and giddiness: Secondary | ICD-10-CM

## 2019-03-28 DIAGNOSIS — I5020 Unspecified systolic (congestive) heart failure: Secondary | ICD-10-CM

## 2019-03-28 DIAGNOSIS — R0602 Shortness of breath: Secondary | ICD-10-CM | POA: Diagnosis not present

## 2019-03-28 DIAGNOSIS — I110 Hypertensive heart disease with heart failure: Secondary | ICD-10-CM | POA: Diagnosis not present

## 2019-03-28 DIAGNOSIS — I5022 Chronic systolic (congestive) heart failure: Secondary | ICD-10-CM | POA: Diagnosis not present

## 2019-03-28 DIAGNOSIS — R079 Chest pain, unspecified: Secondary | ICD-10-CM | POA: Diagnosis not present

## 2019-03-28 NOTE — Patient Instructions (Addendum)
Medication Instructions:   *If you need a refill on your cardiac medications before your next appointment, please call your pharmacy*  Lab Work: NONE ORDERED  If you have labs (blood work) drawn today and your tests are completely normal, you will receive your results only by: Marland Kitchen MyChart Message (if you have MyChart) OR . A paper copy in the mail If you have any lab test that is abnormal or we need to change your treatment, we will call you to review the results.  Testing/Procedures: NONE ORDERED   Follow-Up: At [facility], you and your health needs are our priority.  As part of our continuing mission to provide you with exceptional heart care, we have created designated Provider Care Teams.  These Care Teams include your primary Cardiologist (physician) and Advanced Practice Providers (APPs -  Physician Assistants and Nurse Practitioners) who all work together to provide you with the care you need, when you need it.  Your next appointment:   04/19/19  The format for your next appointment:   In Person  Provider:   [place], NP  @ 9 AM   Other Instructions PER SCOTT WEAVER, PA CONTINUE WITH PLAN TO GO AHEAD WITH CORONARY CT. OUR OFFICE WILL CALL YOU TO SCHEDULE.

## 2019-03-28 NOTE — Progress Notes (Addendum)
Patient presented today for an EKG. Patient had no complaints and no changes. Plan is to proceed with coronary CT. EKG was reviewed by Richardson Dopp, PA-C. Patient was given a BP cuff to monitor her BP at home today.

## 2019-03-29 ENCOUNTER — Other Ambulatory Visit: Payer: Self-pay | Admitting: Physician Assistant

## 2019-03-29 LAB — BASIC METABOLIC PANEL
BUN/Creatinine Ratio: 11 (ref 9–23)
BUN: 9 mg/dL (ref 6–20)
CO2: 24 mmol/L (ref 20–29)
Calcium: 9.3 mg/dL (ref 8.7–10.2)
Chloride: 101 mmol/L (ref 96–106)
Creatinine, Ser: 0.82 mg/dL (ref 0.57–1.00)
GFR calc Af Amer: 105 mL/min/{1.73_m2} (ref >59)
GFR calc non Af Amer: 91 mL/min/{1.73_m2} (ref >59)
Glucose: 100 mg/dL — ABNORMAL HIGH (ref 65–99)
Potassium: 3.6 mmol/L (ref 3.5–5.2)
Sodium: 139 mmol/L (ref 134–144)

## 2019-03-29 LAB — CBC
Hematocrit: 37.9 % (ref 34.0–46.6)
Hemoglobin: 12.5 g/dL (ref 11.1–15.9)
MCH: 28.4 pg (ref 26.6–33.0)
MCHC: 33 g/dL (ref 31.5–35.7)
MCV: 86 fL (ref 79–97)
Platelets: 290 10*3/uL (ref 150–450)
RBC: 4.4 x10E6/uL (ref 3.77–5.28)
RDW: 13.9 % (ref 11.7–15.4)
WBC: 4.9 10*3/uL (ref 3.4–10.8)

## 2019-03-29 LAB — PRO B NATRIURETIC PEPTIDE: NT-Pro BNP: 36 pg/mL (ref 0–130)

## 2019-03-30 ENCOUNTER — Other Ambulatory Visit: Payer: Self-pay | Admitting: Physician Assistant

## 2019-03-31 ENCOUNTER — Other Ambulatory Visit: Payer: Self-pay | Admitting: Physician Assistant

## 2019-04-06 ENCOUNTER — Telehealth: Payer: Self-pay | Admitting: Interventional Cardiology

## 2019-04-06 NOTE — Telephone Encounter (Signed)
Spoke with pt and she states that her BPs were normal prior to starting the Indomethacin, usually 120s-low 130s/70s-mid 80s..  States she actually skipped Indomethacin today and BP was 135/80 when she checked it earlier.  Pt will contact her PCP to see about alternative treatments for her gout and continue to monitor blood pressure.  Advised when to contact office in regards to BP if it continues to stay elevated.  Pt verbalized understanding and was appreciative for call.

## 2019-04-06 NOTE — Telephone Encounter (Signed)
Spoke with pt and she denies increased salt in her diet or swelling other than one toe on one foot which she has gout issues with from time to time.  She found a bottle of Indomethacin from last year that she started a couple of weeks ago when toe started to flare back up.  Verified medications listed in chart are correct.  Will route to Dr. Tamala Julian for review.  Pt is NOT on Metoprolol.  That is a one time dose for her upcoming CT.

## 2019-04-06 NOTE — Telephone Encounter (Signed)
Left message to call back  

## 2019-04-06 NOTE — Telephone Encounter (Signed)
New Message     Pt c/o BP issue: STAT if pt c/o blurred vision, one-sided weakness or slurred speech  1. What are your last 5 BP readings?   148/100 145/92 157/95 168/106 153/97 158/97 10/31 (without medication) 193/124    2. Are you having any other symptoms (ex. Dizziness, headache, blurred vision, passed out)? Dizziness, Lightheaded   3. What is your BP issue? Pt says she starting Indomethacin 50 mg for tingling in her toes. She is not sure if that is why her bp is running higher  She is having some lightheadedness and dizziness

## 2019-04-06 NOTE — Telephone Encounter (Signed)
Let the patient know that the blood pressure elevation could be related to indomethacin.  This is even more likely if the blood pressures have been under better control prior to starting that therapy.  If they were already elevated prior to starting indomethacin we simply need to increase intensity of therapy.  Recommend starting spironolactone 12.5 mg daily she will need a basic metabolic panel 1 week later.

## 2019-04-07 DIAGNOSIS — M109 Gout, unspecified: Secondary | ICD-10-CM | POA: Diagnosis not present

## 2019-04-07 DIAGNOSIS — I5042 Chronic combined systolic (congestive) and diastolic (congestive) heart failure: Secondary | ICD-10-CM | POA: Diagnosis not present

## 2019-04-13 ENCOUNTER — Telehealth (HOSPITAL_COMMUNITY): Payer: Self-pay | Admitting: Emergency Medicine

## 2019-04-13 NOTE — Telephone Encounter (Signed)
Left message on voicemail with name and callback number Anthonie Lotito RN Navigator Cardiac Imaging [facility] [phone] Office [phone] Cell  

## 2019-04-13 NOTE — Progress Notes (Signed)
Virtual Visit via Telephone Note   This visit type was conducted due to national recommendations for restrictions regarding the COVID-19 Pandemic (e.g. social distancing) in an effort to limit this patient's exposure and mitigate transmission in our community.  Due to her co-morbid illnesses, this patient is at least at moderate risk for complications without adequate follow up.  This format is felt to be most appropriate for this patient at this time.  The patient did not have access to video technology/had technical difficulties with video requiring transitioning to audio format only (telephone).  All issues noted in this document were discussed and addressed.  No physical exam could be performed with this format.  Please refer to the patient's chart for her  consent to telehealth for [facility].    ID:  Renee Roberson, DOB 12/20/1980, MRN [id number]  Patient Location: Home Provider Location: [place] Office  PCP:  Maury Dus, MD       Cardiologist:  [name], MD / Truitt Merle, NP  Electrophysiologist:  None  Sleep Medicine:  Fransico Him, MD     Date:  04/19/2019   Follow up Visit  History of Present Illness: Renee Roberson is a 38 y.o. female who presents today for a follow up visit. Seen for Dr. Tamala Julian - sees Dr. Radford Pax for sleep.   She has a history of HTN, PCOS, obesity, chest pain,and chronic combined systolic and diastolic HF. Her previous ejection fraction was 25% in September 2014, and this improved to 40-45% by March 2015 and then normalized to EF of 60 to 65% per echo in 12/2016.   I last saw her in August of 2019.   She had a telehealth visit with Richardson Dopp, PA last month - she had had several issues - this included palpitations, chest pain, weight gain and dizziness after walking. He obtained a coronary CT - this was reassuring. There was discussion for possible event monitor.   The patient does not have symptoms concerning for  COVID-19 infection (fever, chills, cough, or new shortness of breath).   She is seen today by a telephone visit. Her video is not working. She has previously consented for this visit. She called up here yesterday - she did not feel well with the CT - got short of breath and dizzy - BP apparently low - feels better today. She was called back and had a message left to call back - she did not get this. BP is up today - she has not had any of her medicines yet today. Wasn't sure what to do today. She notes her gout is better now that she is on medicines. She denies any chest pain at this time. Says she is not short of breath. She has stopped her exercise.   Past Medical History:  Diagnosis Date  . Abnormal Pap smear of cervix   . Accelerated essential hypertension 03/20/2013   Left ventricular systolic dysfunction as a complication   . Amenorrhea   . Chest pain    Coronary CTA 04/2019: Calcium score 0; no evidence of CAD  . CHF (congestive heart failure) ([place])   . Chronic combined systolic and diastolic heart failure ([place]) 11/13/2013   LVEF improved from 25% to 45% after 6 months of antihypertensive therapy   . Daytime sleepiness 08/17/2016  . Depression   . Diverticulitis of large intestine 12/30/2015  . GERD (gastroesophageal reflux disease)   . Gout   . Hepatic steatosis   .  Hepatomegaly   . HTN (hypertension)   . Hypotension 08/20/2014  . Infertility, female   . Migraine   . Morbid obesity ([place]) 03/20/2013  . OSA on CPAP 02/21/2018   Home sleep study showed severe obstructive sleep apnea with an AHI of 62.3 on night 1 and 92.0 and night to with oxygen desaturations less than 70% for 1 minute.  A total of 1 hour and 22 minutes was noted with O2 sats less than 89%.  52% of the time was spent with oxygen saturations less than 90%. Now on CPAP at 14 cm H2O   . PCOS (polycystic ovarian syndrome)   . Snoring 08/17/2016  . STD (sexually transmitted disease)    HSV (genital)  . Systolic heart  failure [facility])     Past Surgical History:  Procedure Laterality Date  . COLPOSCOPY  2015  . no surgical hx       Medications: Current Meds  Medication Sig  . acetaminophen (TYLENOL) 500 MG tablet Take 500 mg by mouth every 6 (six) hours as needed for mild pain.  Marland Kitchen allopurinol (ZYLOPRIM) 100 MG tablet Take 100 mg by mouth daily.  Marland Kitchen amLODipine-benazepril (LOTREL) 5-40 MG capsule Take 1 capsule by mouth daily. 3  . aspirin EC 81 MG tablet Take 1 tablet (81 mg total) by mouth daily.  Marland Kitchen aspirin-acetaminophen-caffeine (EXCEDRIN MIGRAINE) 250-250-65 MG tablet Take 2 tablets by mouth every 6 (six) hours as needed for migraine.  . carvedilol (COREG) 25 MG tablet Take 1 tablet (25 mg total) by mouth 2 (two) times daily with a meal.  . furosemide (LASIX) 40 MG tablet Take 1 tablet (40 mg total) by mouth daily.  Marland Kitchen omeprazole (PRILOSEC) 40 MG capsule Take 40 mg by mouth as needed (heart burn/gerd).  Marland Kitchen tiZANidine (ZANAFLEX) 4 MG tablet Take 4 mg by mouth as needed (back pain).      Allergies: Allergies  Allergen Reactions  . Fish Allergy Anaphylaxis, Hives and Swelling    Social History: The patient  reports that she has never smoked. She has never used smokeless tobacco. She reports that she does not drink alcohol or use drugs.   Family History: The patient's family history includes Cancer in her maternal grandmother; Diabetes in her paternal grandfather; Hypertension in her father, mother, and sister; Thyroid disease in her father.   Review of Systems: Please see the history of present illness.   All other systems are reviewed and negative.   Physical Exam: VS:  BP (!) 159/97   Pulse 86   Ht 5\' 5"  (1.651 m)   Wt 243 lb 3.2 oz (110.3 kg)   BMI 40.47 kg/m  .  BMI Body mass index is 40.47 kg/m.  Wt Readings from Last 3 Encounters:  04/19/19 243 lb 3.2 oz (110.3 kg)  03/28/19 243 lb (110.2 kg)  03/27/19 249 lb (112.9 kg)     Objective Findings:  She is alert. Not short of  breath with talking on the phone today.    LABORATORY DATA:  EKG:  EKG is not ordered today.   Lab Results  Component Value Date   WBC 4.9 03/28/2019   HGB 12.5 03/28/2019   HCT 37.9 03/28/2019   PLT 290 03/28/2019   GLUCOSE 100 (H) 03/28/2019   ALT 28 05/30/2018   AST 20 05/30/2018   NA 139 03/28/2019   K 3.6 03/28/2019   CL 101 03/28/2019   CREATININE 0.82 03/28/2019   BUN 9 03/28/2019   CO2 24 03/28/2019  TSH 1.49 01/02/2015       BNP (last 3 results) No results for input(s): BNP in the last 8760 hours.  ProBNP (last 3 results) Recent Labs    03/28/19 1214  PROBNP 36     Other Studies Reviewed Today:  CT CORONARY IMPRESSION 04/2019: 1. Coronary calcium score of 0. This was 0 percentile for age and sex matched control.  2. Normal coronary origin with right dominance.  3. No evidence of CAD. CAD-RADS 0. No evidence of CAD (0%). Consider non-atherosclerotic causes of chest pain.  Electronically Signed   By: Ena Dawley   On: 04/17/2019 21:50   Echo Study Conclusions 12/2016  - Left ventricle: The cavity size was normal. There was mild concentric hypertrophy. Systolic function was normal. The estimated ejection fraction was in the range of 60% to 65%. Wall motion was normal; there were no regional wall motion abnormalities. Features are consistent with a pseudonormal left ventricular filling pattern, with concomitant abnormal relaxation and increased filling pressure (grade 2 diastolic dysfunction). - Left atrium: The atrium was mildly dilated. - Pulmonary arteries: Systolic pressure could not be accurately estimated.   Assessment/Plan:  1. Exertional chest pain/multiple somatic complaints -  Has had reassuring coronary CT - ok to resume her medicines. Needs to work on life style - discussed at length with her today.   2. Chronic combined systolic and diastolic HR - she has had normalization of her EF by her last echo  from 2018 - she currently has no symptoms.   3. Morbid obesity - this is always a struggle for her - we talked about referral for bariatric surgery. She is interested in this option. Also discussed decreasing calories with regular activity. Ok to get back to exercise. Referral placed to see Dr. Hassell Done.   4. OSA - on CPAP - not discussed.   5. HTN - BP is up today - she is going to resume her medicines.   6. Palpitations - not really endorsed.  If this returns, we can place event monitor.   7. Dizziness - not endorsed.   8. Gout - seems better - now on allopurinol.   9 COVID-19 Education: The signs and symptoms of COVID-19 were discussed with the patient and how to seek care for testing (follow up with PCP or arrange E-visit).  The importance of social distancing, staying at home, hand hygiene and wearing a mask when out in public were discussed today.   Time:   Today, I have spent 14 minutes with the patient with telehealth technology discussing the above problems.     Current medicines are reviewed with the patient today.  The patient does not have concerns regarding medicines other than what has been noted above.  The following changes have been made:  See above.  Labs/ tests ordered today include:   No orders of the defined types were placed in this encounter.    Disposition:   FU with me in a few months.   Patient is agreeable to this plan and will call if any problems develop in the interim.   SignedTruitt Merle, NP  04/19/2019 9:06 AM  [place] [address] [place], [place]  [address] Phone: [phone] Fax: [phone]

## 2019-04-17 ENCOUNTER — Ambulatory Visit (HOSPITAL_COMMUNITY)
Admission: RE | Admit: 2019-04-17 | Discharge: 2019-04-17 | Disposition: A | Discharge status: Home / Self Care | Payer: BC Managed Care – PPO | Source: Ambulatory Visit | Attending: Physician Assistant | Admitting: Physician Assistant

## 2019-04-17 ENCOUNTER — Other Ambulatory Visit: Payer: Self-pay

## 2019-04-17 DIAGNOSIS — R0602 Shortness of breath: Secondary | ICD-10-CM | POA: Diagnosis not present

## 2019-04-17 DIAGNOSIS — R079 Chest pain, unspecified: Secondary | ICD-10-CM | POA: Diagnosis not present

## 2019-04-17 MED ORDER — SODIUM CHLORIDE 0.9 % IV BOLUS
500.0000 mL | Freq: Once | INTRAVENOUS | Status: AC
  Administered 2019-04-17: 500 mL via INTRAVENOUS

## 2019-04-17 MED ORDER — NITROGLYCERIN 0.4 MG SL SUBL
SUBLINGUAL_TABLET | SUBLINGUAL | Status: AC
  Filled 2019-04-17: qty 2

## 2019-04-17 MED ORDER — IOHEXOL 350 MG/ML SOLN
80.0000 mL | Freq: Once | INTRAVENOUS | Status: AC | PRN
  Administered 2019-04-17: 80 mL via INTRAVENOUS

## 2019-04-17 MED ORDER — NITROGLYCERIN 0.4 MG SL SUBL
0.8000 mg | SUBLINGUAL_TABLET | Freq: Once | SUBLINGUAL | Status: AC
  Administered 2019-04-17: 0.8 mg via SUBLINGUAL

## 2019-04-17 NOTE — Progress Notes (Signed)
After administration of nitroglycerin and arriving back to IR patient complained of extreme dizziness,being light headed, slight SOB and had near syncople episode. Patient placed on stretcher and immediately hooked to monitor. HR 78, BP 104/63, O2 sat 98 on RA. Significant shift in BP pre and post nitro administration noted. Meda Coffee, MD paged. Patient given NS bolus, food  and drink to help resolve situation. Awaiting further orders.

## 2019-04-17 NOTE — Progress Notes (Signed)
Patient has received bolus, ate and drank fluids.Patuient HR and blood pressure has increased. Upon standing patient again felt dizzy and off balance. When walking patient slightly stumbled. Patient advised she is unable to drive home due to condition. Paitients sister coming to transport patient home and remain with her.

## 2019-04-18 ENCOUNTER — Encounter: Payer: Self-pay | Admitting: Physician Assistant

## 2019-04-18 ENCOUNTER — Telehealth: Payer: Self-pay | Admitting: Cardiology

## 2019-04-18 NOTE — Telephone Encounter (Signed)
LMTCB

## 2019-04-18 NOTE — Telephone Encounter (Signed)
Patient would like to speak to nurse. She states yesterday when she had her CT scan yesterday she had SOB and dizziness.  He BP was low, she was sent over to the hospital for observation. She was release last night once her BP and HR stabilized.  She wants to know if she should take her medication as normal today.

## 2019-04-19 ENCOUNTER — Telehealth (INDEPENDENT_AMBULATORY_CARE_PROVIDER_SITE_OTHER): Payer: BC Managed Care – PPO | Admitting: Nurse Practitioner

## 2019-04-19 ENCOUNTER — Encounter: Payer: Self-pay | Admitting: Nurse Practitioner

## 2019-04-19 ENCOUNTER — Other Ambulatory Visit: Payer: Self-pay

## 2019-04-19 VITALS — BP 159/97 | HR 86 | Ht 65.0 in | Wt 243.2 lb

## 2019-04-19 DIAGNOSIS — Z7189 Other specified counseling: Secondary | ICD-10-CM

## 2019-04-19 DIAGNOSIS — R0602 Shortness of breath: Secondary | ICD-10-CM

## 2019-04-19 DIAGNOSIS — Z9989 Dependence on other enabling machines and devices: Secondary | ICD-10-CM

## 2019-04-19 DIAGNOSIS — I5020 Unspecified systolic (congestive) heart failure: Secondary | ICD-10-CM

## 2019-04-19 DIAGNOSIS — R002 Palpitations: Secondary | ICD-10-CM

## 2019-04-19 DIAGNOSIS — I5022 Chronic systolic (congestive) heart failure: Secondary | ICD-10-CM

## 2019-04-19 DIAGNOSIS — R079 Chest pain, unspecified: Secondary | ICD-10-CM

## 2019-04-19 DIAGNOSIS — G4733 Obstructive sleep apnea (adult) (pediatric): Secondary | ICD-10-CM

## 2019-04-19 DIAGNOSIS — I110 Hypertensive heart disease with heart failure: Secondary | ICD-10-CM

## 2019-04-19 NOTE — Patient Instructions (Addendum)
After Visit Summary:  We will be checking the following labs today - NONE   Medication Instructions:    Continue with your current medicines.    If you need a refill on your cardiac medications before your next appointment, please call your pharmacy.     Testing/Procedures To Be Arranged:  N/A  Follow-Up:   See me in about 2 to 3 months  I have placed a referral for consideration of bariatric surgery to see Dr. Hassell Done with [facility] - they should be in contact with you.     At [facility], you and your health needs are our priority.  As part of our continuing mission to provide you with exceptional heart care, we have created designated Provider Care Teams.  These Care Teams include your primary Cardiologist (physician) and Advanced Practice Providers (APPs -  Physician Assistants and Nurse Practitioners) who all work together to provide you with the care you need, when you need it.  Special Instructions:  . Stay safe, stay home, wash your hands for at least 20 seconds and wear a mask when out in public.  . It was good to talk with you today.    Call the [place] office at [phone] if you have any questions, problems or concerns.

## 2019-05-29 ENCOUNTER — Emergency Department (HOSPITAL_COMMUNITY): Payer: BC Managed Care – PPO

## 2019-05-29 ENCOUNTER — Encounter (HOSPITAL_COMMUNITY): Payer: Self-pay | Admitting: Emergency Medicine

## 2019-05-29 ENCOUNTER — Other Ambulatory Visit: Payer: Self-pay

## 2019-05-29 ENCOUNTER — Observation Stay (HOSPITAL_COMMUNITY)
Admission: EM | Admit: 2019-05-29 | Discharge: 2019-05-30 | Disposition: A | Discharge status: Home / Self Care | Payer: BC Managed Care – PPO | Attending: Family Medicine | Admitting: Family Medicine

## 2019-05-29 DIAGNOSIS — T504X1A Poisoning by drugs affecting uric acid metabolism, accidental (unintentional), initial encounter: Principal | ICD-10-CM | POA: Insufficient documentation

## 2019-05-29 DIAGNOSIS — Z79899 Other long term (current) drug therapy: Secondary | ICD-10-CM | POA: Diagnosis not present

## 2019-05-29 DIAGNOSIS — Z03818 Encounter for observation for suspected exposure to other biological agents ruled out: Secondary | ICD-10-CM | POA: Diagnosis not present

## 2019-05-29 DIAGNOSIS — Z20828 Contact with and (suspected) exposure to other viral communicable diseases: Secondary | ICD-10-CM | POA: Diagnosis not present

## 2019-05-29 DIAGNOSIS — Z91018 Allergy to other foods: Secondary | ICD-10-CM | POA: Insufficient documentation

## 2019-05-29 DIAGNOSIS — Z9989 Dependence on other enabling machines and devices: Secondary | ICD-10-CM

## 2019-05-29 DIAGNOSIS — M109 Gout, unspecified: Secondary | ICD-10-CM | POA: Insufficient documentation

## 2019-05-29 DIAGNOSIS — I5032 Chronic diastolic (congestive) heart failure: Secondary | ICD-10-CM | POA: Diagnosis present

## 2019-05-29 DIAGNOSIS — G4733 Obstructive sleep apnea (adult) (pediatric): Secondary | ICD-10-CM

## 2019-05-29 DIAGNOSIS — I5042 Chronic combined systolic (congestive) and diastolic (congestive) heart failure: Secondary | ICD-10-CM | POA: Diagnosis not present

## 2019-05-29 DIAGNOSIS — I10 Essential (primary) hypertension: Secondary | ICD-10-CM

## 2019-05-29 DIAGNOSIS — Z7982 Long term (current) use of aspirin: Secondary | ICD-10-CM | POA: Insufficient documentation

## 2019-05-29 DIAGNOSIS — R0602 Shortness of breath: Secondary | ICD-10-CM | POA: Diagnosis not present

## 2019-05-29 DIAGNOSIS — G4730 Sleep apnea, unspecified: Secondary | ICD-10-CM | POA: Insufficient documentation

## 2019-05-29 DIAGNOSIS — I110 Hypertensive heart disease with heart failure: Secondary | ICD-10-CM | POA: Insufficient documentation

## 2019-05-29 HISTORY — DX: Poisoning by drugs affecting uric acid metabolism, accidental (unintentional), initial encounter: T50.4X1A

## 2019-05-29 LAB — COMPREHENSIVE METABOLIC PANEL
ALT: 26 U/L (ref 0–44)
AST: 24 U/L (ref 15–41)
Albumin: 4.7 g/dL (ref 3.5–5.0)
Alkaline Phosphatase: 50 U/L (ref 38–126)
Anion gap: 11 (ref 5–15)
BUN: 10 mg/dL (ref 6–20)
CO2: 23 mmol/L (ref 22–32)
Calcium: 9.5 mg/dL (ref 8.9–10.3)
Chloride: 107 mmol/L (ref 98–111)
Creatinine, Ser: 0.74 mg/dL (ref 0.44–1.00)
GFR calc Af Amer: >60 mL/min (ref >60)
GFR calc non Af Amer: >60 mL/min (ref >60)
Glucose, Bld: 106 mg/dL — ABNORMAL HIGH (ref 70–99)
Potassium: 3.6 mmol/L (ref 3.5–5.1)
Sodium: 141 mmol/L (ref 135–145)
Total Bilirubin: 1.1 mg/dL (ref 0.3–1.2)
Total Protein: 8.2 g/dL — ABNORMAL HIGH (ref 6.5–8.1)

## 2019-05-29 LAB — BLOOD GAS, VENOUS
Acid-base deficit: 0.9 mmol/L (ref 0.0–2.0)
Bicarbonate: 24.7 mmol/L (ref 20.0–28.0)
O2 Saturation: 56.2 %
Patient temperature: 98.6
pCO2, Ven: 46.9 mmHg (ref 44.0–60.0)
pH, Ven: 7.341 (ref 7.250–7.430)
pO2, Ven: 33.2 mmHg (ref 32.0–45.0)

## 2019-05-29 LAB — CBC WITH DIFFERENTIAL/PLATELET
Abs Immature Granulocytes: 0.02 10*3/uL (ref 0.00–0.07)
Basophils Absolute: 0 10*3/uL (ref 0.0–0.1)
Basophils Relative: 0 %
Eosinophils Absolute: 0 10*3/uL (ref 0.0–0.5)
Eosinophils Relative: 0 %
HCT: 40.3 % (ref 36.0–46.0)
Hemoglobin: 13 g/dL (ref 12.0–15.0)
Immature Granulocytes: 0 %
Lymphocytes Relative: 44 %
Lymphs Abs: 3.6 10*3/uL (ref 0.7–4.0)
MCH: 29.5 pg (ref 26.0–34.0)
MCHC: 32.3 g/dL (ref 30.0–36.0)
MCV: 91.6 fL (ref 80.0–100.0)
Monocytes Absolute: 0.6 10*3/uL (ref 0.1–1.0)
Monocytes Relative: 8 %
Neutro Abs: 3.8 10*3/uL (ref 1.7–7.7)
Neutrophils Relative %: 48 %
Platelets: 302 10*3/uL (ref 150–400)
RBC: 4.4 MIL/uL (ref 3.87–5.11)
RDW: 14.7 % (ref 11.5–15.5)
WBC: 8.1 10*3/uL (ref 4.0–10.5)
nRBC: 0 % (ref 0.0–0.2)

## 2019-05-29 LAB — ETHANOL: Alcohol, Ethyl (B): <10 mg/dL (ref <10)

## 2019-05-29 LAB — CK: Total CK: 108 U/L (ref 38–234)

## 2019-05-29 LAB — URINALYSIS, ROUTINE W REFLEX MICROSCOPIC
Bilirubin Urine: NEGATIVE
Glucose, UA: NEGATIVE mg/dL
Ketones, ur: NEGATIVE mg/dL
Nitrite: NEGATIVE
Protein, ur: 100 mg/dL — AB
RBC / HPF: >50 RBC/hpf — ABNORMAL HIGH (ref 0–5)
Specific Gravity, Urine: 1.017 (ref 1.005–1.030)
WBC, UA: >50 WBC/hpf — ABNORMAL HIGH (ref 0–5)
pH: 5 (ref 5.0–8.0)

## 2019-05-29 LAB — TROPONIN I (HIGH SENSITIVITY): Troponin I (High Sensitivity): <2 ng/L (ref <18)

## 2019-05-29 LAB — RAPID URINE DRUG SCREEN, HOSP PERFORMED
Amphetamines: NOT DETECTED
Barbiturates: NOT DETECTED
Benzodiazepines: NOT DETECTED
Cocaine: NOT DETECTED
Opiates: NOT DETECTED
Tetrahydrocannabinol: NOT DETECTED

## 2019-05-29 LAB — LACTIC ACID, PLASMA: Lactic Acid, Venous: 2.3 mmol/L (ref 0.5–1.9)

## 2019-05-29 LAB — PROTIME-INR
INR: 0.9 (ref 0.8–1.2)
Prothrombin Time: 12.5 seconds (ref 11.4–15.2)

## 2019-05-29 LAB — ACETAMINOPHEN LEVEL: Acetaminophen (Tylenol), Serum: <10 ug/mL — ABNORMAL LOW (ref 10–30)

## 2019-05-29 LAB — SALICYLATE LEVEL: Salicylate Lvl: <7 mg/dL — ABNORMAL LOW (ref 7.0–30.0)

## 2019-05-29 MED ORDER — PANTOPRAZOLE SODIUM 40 MG PO TBEC
80.0000 mg | DELAYED_RELEASE_TABLET | Freq: Every day | ORAL | Status: DC
  Administered 2019-05-30: 10:00:00 80 mg via ORAL
  Filled 2019-05-29: qty 2

## 2019-05-29 MED ORDER — PREDNISONE 20 MG PO TABS
40.0000 mg | ORAL_TABLET | Freq: Every day | ORAL | Status: DC
  Administered 2019-05-30 (×2): 40 mg via ORAL
  Filled 2019-05-29 (×3): qty 2

## 2019-05-29 MED ORDER — OXYCODONE-ACETAMINOPHEN 5-325 MG PO TABS: 1.0000 | ORAL_TABLET | Freq: Four times a day (QID) | ORAL | Status: DC | PRN

## 2019-05-29 MED ORDER — HEPARIN SODIUM (PORCINE) 5000 UNIT/ML IJ SOLN
5000.0000 [IU] | Freq: Three times a day (TID) | INTRAMUSCULAR | Status: DC
  Filled 2019-05-29: qty 1

## 2019-05-29 MED ORDER — CARVEDILOL 25 MG PO TABS
25.0000 mg | ORAL_TABLET | Freq: Two times a day (BID) | ORAL | Status: DC
  Administered 2019-05-30 (×2): 25 mg via ORAL
  Filled 2019-05-29 (×3): qty 1

## 2019-05-29 MED ORDER — CARVEDILOL 25 MG PO TABS: 25.0000 mg | ORAL_TABLET | Freq: Two times a day (BID) | ORAL | Status: DC

## 2019-05-29 MED ORDER — ONDANSETRON HCL 4 MG/2ML IJ SOLN: 4.0000 mg | Freq: Four times a day (QID) | INTRAMUSCULAR | Status: DC | PRN

## 2019-05-29 MED ORDER — CHARCOAL ACTIVATED PO LIQD
75.0000 g | Freq: Once | ORAL | Status: AC
  Administered 2019-05-29: 20:00:00 75 g via ORAL
  Filled 2019-05-29: qty 480

## 2019-05-29 MED ORDER — ONDANSETRON HCL 4 MG/2ML IJ SOLN
4.0000 mg | Freq: Once | INTRAMUSCULAR | Status: AC
  Administered 2019-05-29: 4 mg via INTRAVENOUS
  Filled 2019-05-29: qty 2

## 2019-05-29 MED ORDER — ACETAMINOPHEN 325 MG PO TABS: 650.0000 mg | ORAL_TABLET | Freq: Four times a day (QID) | ORAL | Status: DC | PRN

## 2019-05-29 MED ORDER — ACETAMINOPHEN 650 MG RE SUPP: 650.0000 mg | Freq: Four times a day (QID) | RECTAL | Status: DC | PRN

## 2019-05-29 MED ORDER — ONDANSETRON HCL 4 MG PO TABS: 4.0000 mg | ORAL_TABLET | Freq: Four times a day (QID) | ORAL | Status: DC | PRN

## 2019-05-29 MED ORDER — AMLODIPINE BESYLATE 5 MG PO TABS
5.0000 mg | ORAL_TABLET | Freq: Every day | ORAL | Status: DC
  Administered 2019-05-30: 5 mg via ORAL
  Filled 2019-05-29: qty 1

## 2019-05-29 MED ORDER — SODIUM CHLORIDE 0.9 % IV BOLUS
500.0000 mL | Freq: Once | INTRAVENOUS | Status: AC
  Administered 2019-05-29: 23:00:00 500 mL via INTRAVENOUS

## 2019-05-29 MED ORDER — FENTANYL CITRATE (PF) 100 MCG/2ML IJ SOLN
50.0000 ug | Freq: Once | INTRAMUSCULAR | Status: AC
  Administered 2019-05-29: 23:00:00 50 ug via INTRAVENOUS
  Filled 2019-05-29: qty 2

## 2019-05-29 MED ORDER — CHARCOAL ACTIVATED PO LIQD: 50.0000 g | Freq: Once | ORAL | Status: DC

## 2019-05-29 MED ORDER — CARVEDILOL 25 MG PO TABS
25.0000 mg | ORAL_TABLET | Freq: Once | ORAL | Status: DC
  Filled 2019-05-29: qty 1

## 2019-05-29 NOTE — ED Notes (Signed)
Date and time results received: 05/29/19 9:11 PM   Test: Lactic Acid Critical Value: 2.3  Name of Provider Notified:Dr.Dykstra  Orders Received? Or Actions Taken?: none

## 2019-05-29 NOTE — ED Notes (Addendum)
Poison Control Ms Patty : electrolytes, calcium, liver function, pt/inr, cbc, cpk, trop, ekg, Charc 1gram/kg=75grams. Repeat labs 24 hours. Speak with toxicology to see if repeat labs are needed and if patient is to be admitted.

## 2019-05-29 NOTE — H&P (Signed)
History and Physical    Renee Roberson:[id number] DOB: 1981-04-22 DOA: 05/29/2019  PCP: Maury Dus, MD  Patient coming from: Home  I have personally briefly reviewed patient's old medical records in [place]  Chief Complaint: Colchicine overdose  HPI: Renee Roberson is a 38 y.o. female with medical history significant for chronic combined systolic and diastolic CHF, hypertension, gout, morbid obesity, and OSA on CPAP who presents to the ED for evaluation of colchicine overdose.  Patient states she has a history of gout which normally affects her large toe.  Earlier today she began having left lateral ankle pain which he felt was similar to her prior gout pain.  She has a prescription for colchicine 0.6 mg tablets which she says was written to take 2 tablets once and every hour as needed.  She was having persistent pain and states she was taking a tablet every hour until she got to 10-12 total tablets.  She last took colchicine around 4 PM 05/29/2019.  She was concerned that this was an excessive amount and called her pharmacy who recommended she contact her PCP.  She called her primary physician who recommended she present to the ED for further evaluation.  She has been having new onset of nausea with dry heaves but no emesis, abdominal discomfort, and watery diarrhea.  She is still having pain at her left ankle.  She otherwise denies any fevers, chills, diaphoresis, obvious bleeding, or dysuria.  ED Course:  Initial vitals showed BP 150/100, pulse 89, RR 19, temp 98.8 Fahrenheit, SPO2 97% on room air.  Poison control were called and recommended obtaining labs which are notable for:  BUN 10, creatinine 0.74, Sodium 141, potassium 3.6, bicarb 23,  AST 24, ALT 26, alk phos 50, total bilirubin 1.1, WBC 8.1, hemoglobin 13.0, platelets 302,000, lactic acid 2.3, PT 12.5, INR 0.9.  Serum salicylate, acetaminophen, and ethanol levels were undetectable.  UDS was negative.  CK 1  await high-sensitivity troponin I <2.  VBG showed pH 7.341, PCO2 46.9, PO2 33.2.  Urinalysis showed negative nitrites, large leukocytes, 100 protein, negative ketones, >50 RBCs and WBCs, few bacteria on microscopy.  SARS-CoV-2 PCR test was collected and pending.  Patient was given 500 mL normal saline, Coreg 25 mg orally once, 50 mcg fentanyl once, and IV Zofran.  Poison control recommended admission for 24-hour observation with repeat labs and if stable can be discharged home.  The hospitalist service was consulted to admit for further evaluation and management.  Review of Systems: All systems reviewed and are negative except as documented in history of present illness above.   Past Medical History:  Diagnosis Date  . Abnormal Pap smear of cervix   . Accelerated essential hypertension 03/20/2013   Left ventricular systolic dysfunction as a complication   . Amenorrhea   . Chest pain    Coronary CTA 04/2019: Calcium score 0; no evidence of CAD  . CHF (congestive heart failure) ([place])   . Chronic combined systolic and diastolic heart failure ([place]) 11/13/2013   LVEF improved from 25% to 45% after 6 months of antihypertensive therapy   . Daytime sleepiness 08/17/2016  . Depression   . Diverticulitis of large intestine 12/30/2015  . GERD (gastroesophageal reflux disease)   . Gout   . Hepatic steatosis   . Hepatomegaly   . HTN (hypertension)   . Hypotension 08/20/2014  . Infertility, female   . Migraine   . Morbid obesity ([place]) 03/20/2013  . OSA on CPAP  02/21/2018   Home sleep study showed severe obstructive sleep apnea with an AHI of 62.3 on night 1 and 92.0 and night to with oxygen desaturations less than 70% for 1 minute.  A total of 1 hour and 22 minutes was noted with O2 sats less than 89%.  52% of the time was spent with oxygen saturations less than 90%. Now on CPAP at 14 cm H2O   . PCOS (polycystic ovarian syndrome)   . Snoring 08/17/2016  . STD (sexually transmitted disease)     HSV (genital)  . Systolic heart failure [facility])     Past Surgical History:  Procedure Laterality Date  . COLPOSCOPY  2015  . no surgical hx      Social History:  reports that she has never smoked. She has never used smokeless tobacco. She reports that she does not drink alcohol or use drugs.  Allergies  Allergen Reactions  . Fish Allergy Anaphylaxis, Hives and Swelling    Family History  Problem Relation Age of Onset  . Hypertension Mother   . Hypertension Father   . Thyroid disease Father   . Hypertension Sister   . Cancer Maternal Grandmother        ovarian or colon  . Diabetes Paternal Grandfather      Prior to Admission medications   Medication Sig Start Date End Date Taking? Authorizing Provider  acetaminophen (TYLENOL) 500 MG tablet Take 500 mg by mouth every 6 (six) hours as needed for mild pain.   Yes [provider]  allopurinol (ZYLOPRIM) 100 MG tablet Take 100 mg by mouth daily. 04/07/19  Yes [provider]  amLODipine-benazepril (LOTREL) 5-40 MG capsule Take 1 capsule by mouth daily. 3 03/27/19  Yes Weaver, Scott T, PA-C  aspirin-acetaminophen-caffeine (EXCEDRIN MIGRAINE) [phone] MG tablet Take 2 tablets by mouth every 6 (six) hours as needed for migraine.   Yes [provider]  carvedilol (COREG) 25 MG tablet Take 1 tablet (25 mg total) by mouth 2 (two) times daily with a meal. 03/27/19  Yes Weaver, Scott T, PA-C  furosemide (LASIX) 40 MG tablet Take 1 tablet (40 mg total) by mouth daily. 03/27/19  Yes Weaver, Scott T, PA-C  omeprazole (PRILOSEC) 40 MG capsule Take 40 mg by mouth as needed (heart burn/gerd).   Yes [provider]  tiZANidine (ZANAFLEX) 4 MG tablet Take 4 mg by mouth as needed (back pain).  03/04/19  Yes [provider]  aspirin EC 81 MG tablet Take 1 tablet (81 mg total) by mouth daily. Patient not taking: Reported on 05/29/2019 03/27/19   Richardson Dopp T, [place]    Physical Exam: Vitals:   05/29/19  2130 05/29/19 2200 05/29/19 2230 05/29/19 2300  BP: (!) 135/91 (!) 149/100 (!) 144/92 (!) 159/104  Pulse: 84 88  78  Resp: (!) 28 (!) 21 (!) 28 (!) 29  Temp:      TempSrc:      SpO2: 99% 100%  98%  Weight:      Height:        Constitutional: Obese woman resting supine in bed with head elevated, NAD, calm, comfortable Eyes: PERRL, lids and conjunctivae normal ENMT: Mucous membranes are moist. Posterior pharynx clear of any exudate or lesions.Normal dentition.  Neck: normal, supple, no masses. Respiratory: clear to auscultation bilaterally, no wheezing, no crackles. Normal respiratory effort. No accessory muscle use.  Cardiovascular: Regular rate and rhythm, no murmurs / rubs / gallops. No extremity edema. 2+ pedal pulses. Abdomen: no tenderness,  no masses palpated. No hepatosplenomegaly. Bowel sounds positive.  Musculoskeletal: Tender to light palpation over left lateral ankle without obvious swelling or erythema.  No clubbing / cyanosis. No joint deformity upper and lower extremities. Good ROM, no contractures. Normal muscle tone.  Skin: no rashes, lesions, ulcers. No induration Neurologic: CN 2-12 grossly intact. Sensation intact, DTR normal. Strength 5/5 in all 4.  Psychiatric: Normal judgment and insight. Alert and oriented x 3. Normal mood.   Labs on Admission: I have personally reviewed following labs and imaging studies  CBC: Recent Labs  Lab 05/29/19 1956  WBC 8.1  NEUTROABS 3.8  HGB 13.0  HCT 40.3  MCV 91.6  PLT 213   Basic Metabolic Panel: Recent Labs  Lab 05/29/19 1956  NA 141  K 3.6  CL 107  CO2 23  GLUCOSE 106*  BUN 10  CREATININE 0.74  CALCIUM 9.5   GFR: Estimated Creatinine Clearance: 118.6 mL/min (by C-G formula based on SCr of 0.74 mg/dL). Liver Function Tests: Recent Labs  Lab 05/29/19 1956  AST 24  ALT 26  ALKPHOS 50  BILITOT 1.1  PROT 8.2*  ALBUMIN 4.7   No results for input(s): LIPASE, AMYLASE in the last 168 hours. No results for  input(s): AMMONIA in the last 168 hours. Coagulation Profile: Recent Labs  Lab 05/29/19 1956  INR 0.9   Cardiac Enzymes: Recent Labs  Lab 05/29/19 2006  CKTOTAL 108   BNP (last 3 results) Recent Labs    03/28/19 1214  PROBNP 36   HbA1C: No results for input(s): HGBA1C in the last 72 hours. CBG: No results for input(s): GLUCAP in the last 168 hours. Lipid Profile: No results for input(s): CHOL, HDL, LDLCALC, TRIG, CHOLHDL, LDLDIRECT in the last 72 hours. Thyroid Function Tests: No results for input(s): TSH, T4TOTAL, FREET4, T3FREE, THYROIDAB in the last 72 hours. Anemia Panel: No results for input(s): VITAMINB12, FOLATE, FERRITIN, TIBC, IRON, RETICCTPCT in the last 72 hours. Urine analysis:    Component Value Date/Time   COLORURINE YELLOW 05/29/2019 1957   APPEARANCEUR CLOUDY (A) 05/29/2019 1957   LABSPEC 1.017 05/29/2019 1957   PHURINE 5.0 05/29/2019 1957   GLUCOSEU NEGATIVE 05/29/2019 1957   HGBUR MODERATE (A) 05/29/2019 [place] NEGATIVE 05/29/2019 1957   BILIRUBINUR n 08/12/2017 [place] 05/29/2019 1957   PROTEINUR 100 (A) 05/29/2019 1957   UROBILINOGEN negative (A) 08/12/2017 1330   NITRITE NEGATIVE 05/29/2019 1957   LEUKOCYTESUR LARGE (A) 05/29/2019 1957    Radiological Exams on Admission: DG Chest Portable 1 View  Result Date: 05/29/2019 CLINICAL DATA:  Shortness of breath EXAM: PORTABLE CHEST 1 VIEW COMPARISON:  May 19, 2016 FINDINGS: No edema or consolidation. Heart size and pulmonary vascularity are normal. No adenopathy. No bone lesions. IMPRESSION: No edema or consolidation.  Stable cardiac silhouette. Electronically Signed   By: Lowella Grip III M.D.   On: 05/29/2019 20:20    EKG: Independently reviewed.  Normal sinus rhythm, low voltage, no acute ischemic changes.  Assessment/Plan Principal Problem:   Colchicine overdose Active Problems:   HTN (hypertension)   Chronic combined systolic and diastolic heart  failure (HCC)   OSA on CPAP  SURINA STORTS is a 38 y.o. female with medical history significant for chronic combined systolic and diastolic CHF, hypertension, gout, morbid obesity, and OSA on CPAP who is admitted for observation after colchicine overdose.  Colchicine overdose with reported history of gout: Patient presenting after taking up to 10-12 tablets of 0.6 mg  colchicine for which she felt was a gout attack on her left ankle.  Poison control has recommended 24-hour observation with follow-up labs.  Initial labs in the ED are reassuring. -Place in observation, hold colchicine -Repeat CBC, CMP, CK, PT/INR, troponin in a.m. -Start prednisone 40 mg daily x5 days for gout pain, continue Tylenol and Percocet as needed  Chronic combined systolic and diastolic CHF: EF improved to 60-65% on last echogram 01/05/2017.  She appears euvolemic on admission. -Continue carvedilol -Hold Lasix and benazepril tonight pending repeat renal function  Hypertension: Continue carvedilol and amlodipine.  Holding Lasix and benazepril as above.  OSA: Continue CPAP at night.  DVT prophylaxis: Subcutaneous heparin Code Status: Full code, confirmed with patient Family Communication: Discussed with patient, she has discussed with her family Disposition Plan: Likely discharge to home in 1-2 days Consults called: None Admission status: Observation   Zada Finders MD [facility]  If 7PM-7AM, please contact night-coverage [email or web address]  05/29/2019, 11:32 PM

## 2019-05-29 NOTE — ED Notes (Signed)
XR at bedside

## 2019-05-29 NOTE — ED Provider Notes (Signed)
[facility] DEPT Provider Note   CSN: [id number] Arrival date & time: 05/29/19  1910     History No chief complaint on file.   Renee Roberson is a 38 y.o. female.  Presents to the emergency department with concern for colchicine overdose.  Patient states she has long history of gout, has prescription for colchicine for gout flares.  States she had some ankle pain, felt typical for her gout and started taking colchicine this morning.  States first dose was around 9 AM and then took 1 pill every hour approximately for total of 12 pills.  Takes 0.6 mg tablets.  States she has noted some diarrhea, nausea, no vomiting.  No abdominal pain no chest pain.  Feels mildly short of breath right now.  No fever, no LOC.  HPI     Past Medical History:  Diagnosis Date  . Abnormal Pap smear of cervix   . Accelerated essential hypertension 03/20/2013   Left ventricular systolic dysfunction as a complication   . Amenorrhea   . Chest pain    Coronary CTA 04/2019: Calcium score 0; no evidence of CAD  . CHF (congestive heart failure) ([place])   . Chronic combined systolic and diastolic heart failure ([place]) 11/13/2013   LVEF improved from 25% to 45% after 6 months of antihypertensive therapy   . Daytime sleepiness 08/17/2016  . Depression   . Diverticulitis of large intestine 12/30/2015  . GERD (gastroesophageal reflux disease)   . Gout   . Hepatic steatosis   . Hepatomegaly   . HTN (hypertension)   . Hypotension 08/20/2014  . Infertility, female   . Migraine   . Morbid obesity ([place]) 03/20/2013  . OSA on CPAP 02/21/2018   Home sleep study showed severe obstructive sleep apnea with an AHI of 62.3 on night 1 and 92.0 and night to with oxygen desaturations less than 70% for 1 minute.  A total of 1 hour and 22 minutes was noted with O2 sats less than 89%.  52% of the time was spent with oxygen saturations less than 90%. Now on CPAP at 14 cm H2O   . PCOS (polycystic ovarian  syndrome)   . Snoring 08/17/2016  . STD (sexually transmitted disease)    HSV (genital)  . Systolic heart failure [facility])     Patient Active Problem List   Diagnosis Date Noted  . Colchicine overdose 05/29/2019  . OSA on CPAP 02/21/2018  . Snoring 08/17/2016  . Daytime sleepiness 08/17/2016  . Diverticulitis of large intestine 12/30/2015  . Hypotension 08/20/2014  . Chronic combined systolic and diastolic heart failure ([place]) 11/13/2013  . HTN (hypertension) 03/20/2013    Class: Chronic  . Morbid obesity ([place]) 03/20/2013  . Polycystic ovary syndrome 03/20/2013    Class: Chronic    Past Surgical History:  Procedure Laterality Date  . COLPOSCOPY  2015  . no surgical hx       OB History    Gravida  1   Para  0   Term  0   Preterm  0   AB  1   Living  0     SAB  1   TAB      Ectopic      Multiple      Live Births              Family History  Problem Relation Age of Onset  . Hypertension Mother   . Hypertension Father   . Thyroid disease  Father   . Hypertension Sister   . Cancer Maternal Grandmother        ovarian or colon  . Diabetes Paternal Grandfather     Social History   Tobacco Use  . Smoking status: Never Smoker  . Smokeless tobacco: Never Used  Substance Use Topics  . Alcohol use: No    Alcohol/week: 0.0 standard drinks  . Drug use: No    Home Medications Prior to Admission medications   Medication Sig Start Date End Date Taking? Authorizing Provider  acetaminophen (TYLENOL) 500 MG tablet Take 500 mg by mouth every 6 (six) hours as needed for mild pain.   Yes [provider]  allopurinol (ZYLOPRIM) 100 MG tablet Take 100 mg by mouth daily. 04/07/19  Yes [provider]  amLODipine-benazepril (LOTREL) 5-40 MG capsule Take 1 capsule by mouth daily. 3 03/27/19  Yes Weaver, Scott T, PA-C  aspirin-acetaminophen-caffeine (EXCEDRIN MIGRAINE) [phone] MG tablet Take 2 tablets by mouth every 6 (six) hours as needed for  migraine.   Yes [provider]  carvedilol (COREG) 25 MG tablet Take 1 tablet (25 mg total) by mouth 2 (two) times daily with a meal. 03/27/19  Yes Weaver, Scott T, PA-C  furosemide (LASIX) 40 MG tablet Take 1 tablet (40 mg total) by mouth daily. 03/27/19  Yes Weaver, Scott T, PA-C  omeprazole (PRILOSEC) 40 MG capsule Take 40 mg by mouth as needed (heart burn/gerd).   Yes [provider]  tiZANidine (ZANAFLEX) 4 MG tablet Take 4 mg by mouth as needed (back pain).  03/04/19  Yes [provider]  aspirin EC 81 MG tablet Take 1 tablet (81 mg total) by mouth daily. Patient not taking: Reported on 05/29/2019 03/27/19   Richardson Dopp T, PA-C    Allergies    Fish allergy  Review of Systems   Review of Systems  Constitutional: Negative for chills and fever.  HENT: Negative for ear pain and sore throat.   Eyes: Negative for pain and visual disturbance.  Respiratory: Negative for cough and shortness of breath.   Cardiovascular: Negative for chest pain and palpitations.  Gastrointestinal: Positive for nausea. Negative for abdominal pain and vomiting.  Genitourinary: Negative for dysuria and hematuria.  Musculoskeletal: Negative for arthralgias and back pain.  Skin: Negative for color change and rash.  Neurological: Negative for seizures and syncope.  All other systems reviewed and are negative.    Physical Exam Updated Vital Signs BP (!) 142/95   Pulse 77   Temp 98.8 F (37.1 C) (Oral)   Resp (!) 28   Ht 5\' 5"  (1.651 m)   Wt 111.6 kg   SpO2 99%   BMI 40.94 kg/m   Physical Exam Vitals and nursing note reviewed.  Constitutional:      General: She is not in acute distress.    Appearance: She is well-developed.  HENT:     Head: Normocephalic and atraumatic.  Eyes:     Conjunctiva/sclera: Conjunctivae normal.  Cardiovascular:     Rate and Rhythm: Normal rate and regular rhythm.     Heart sounds: No murmur.  Pulmonary:     Effort: Pulmonary effort is  normal. No respiratory distress.     Breath sounds: Normal breath sounds.  Abdominal:     Palpations: Abdomen is soft.     Tenderness: There is no abdominal tenderness.  Musculoskeletal:     Cervical back: Neck supple.     Comments: LLE: no swelling, no TTP over ankle, foot or  toes, normal joint ROM  Skin:    General: Skin is warm and dry.  Neurological:     General: No focal deficit present.     Mental Status: She is alert and oriented to person, place, and time.  Psychiatric:        Mood and Affect: Mood normal.        Behavior: Behavior normal.     ED Results / Procedures / Treatments   Labs (all labs ordered are listed, but only abnormal results are displayed) Labs Reviewed  COMPREHENSIVE METABOLIC PANEL - Abnormal; Notable for the following components:      Result Value   Glucose, Bld 106 (*)    Total Protein 8.2 (*)    All other components within normal limits  SALICYLATE LEVEL - Abnormal; Notable for the following components:   Salicylate Lvl [id number] (*)    All other components within normal limits  ACETAMINOPHEN LEVEL - Abnormal; Notable for the following components:   Acetaminophen (Tylenol), Serum <10 (*)    All other components within normal limits  URINALYSIS, ROUTINE W REFLEX MICROSCOPIC - Abnormal; Notable for the following components:   APPearance CLOUDY (*)    Hgb urine dipstick MODERATE (*)    Protein, ur 100 (*)    Leukocytes,Ua LARGE (*)    RBC / HPF >50 (*)    WBC, UA >50 (*)    Bacteria, UA FEW (*)    All other components within normal limits  LACTIC ACID, PLASMA - Abnormal; Notable for the following components:   Lactic Acid, Venous 2.3 (*)    All other components within normal limits  SARS CORONAVIRUS 2 (TAT 6-24 HRS)  CBC WITH DIFFERENTIAL/PLATELET  ETHANOL  RAPID URINE DRUG SCREEN, HOSP PERFORMED  PROTIME-INR  BLOOD GAS, VENOUS  CK  COMPREHENSIVE METABOLIC PANEL  CBC  PROTIME-INR  CK  TROPONIN I (HIGH SENSITIVITY)    EKG EKG  Interpretation  Date/Time:  Monday May 29 2019 19:52:13 EST Ventricular Rate:  91 PR Interval:    QRS Duration: 72 QT Interval:  367 QTC Calculation: 452 R Axis:   30 Text Interpretation: Sinus rhythm Low voltage, precordial leads Confirmed by Madalyn Rob [phone]) on 05/29/2019 7:53:03 PM   Radiology DG Chest Portable 1 View  Result Date: 05/29/2019 CLINICAL DATA:  Shortness of breath EXAM: PORTABLE CHEST 1 VIEW COMPARISON:  May 19, 2016 FINDINGS: No edema or consolidation. Heart size and pulmonary vascularity are normal. No adenopathy. No bone lesions. IMPRESSION: No edema or consolidation.  Stable cardiac silhouette. Electronically Signed   By: Lowella Grip III M.D.   On: 05/29/2019 20:20    Procedures Procedures (including critical care time)  Medications Ordered in ED Medications  oxyCODONE-acetaminophen (PERCOCET/ROXICET) 5-325 MG per tablet 1 tablet (has no administration in time range)  heparin injection 5,000 Units (5,000 Units Subcutaneous Not Given 05/30/19 0042)  acetaminophen (TYLENOL) tablet 650 mg (has no administration in time range)    Or  acetaminophen (TYLENOL) suppository 650 mg (has no administration in time range)  ondansetron (ZOFRAN) tablet 4 mg (has no administration in time range)    Or  ondansetron (ZOFRAN) injection 4 mg (has no administration in time range)  pantoprazole (PROTONIX) EC tablet 80 mg (has no administration in time range)  carvedilol (COREG) tablet 25 mg (has no administration in time range)  amLODipine (NORVASC) tablet 5 mg (has no administration in time range)  predniSONE (DELTASONE) tablet 40 mg (40 mg Oral Given 05/30/19 0041)  ondansetron (ZOFRAN) injection  4 mg (4 mg Intravenous Given 05/29/19 2017)  charcoal activated (NO SORBITOL) (ACTIDOSE-AQUA) suspension 75 g (75 g Oral Given 05/29/19 2011)  sodium chloride 0.9 % bolus 500 mL (500 mLs Intravenous New Bag/Given 05/29/19 2245)  fentaNYL (SUBLIMAZE) injection  50 mcg (50 mcg Intravenous Given 05/29/19 2245)    ED Course  I have reviewed the triage vital signs and the nursing notes.  Pertinent labs & imaging results that were available during my care of the patient were reviewed by me and considered in my medical decision making (see chart for details).  Clinical Course as of May 29 104  Mon May 29, 2019  2009 Complete initial assessment, getting activated charcoal per poison center recommendation and obtaining labs per their recommendation, patient is currently well-appearing with stable vital signs, will monitor closely   [RD]    Clinical Course User Index [RD] Lucrezia Starch, MD   MDM Rules/Calculators/A&P                      38 year old lady presents to ER after accidental colchicine overdose for suspected gout flare.  Here patient well-appearing in no distress with normal vital signs.  Case reviewed with poison center, recommended giving activated charcoal.  This was performed, patient tolerated well. All of her labs came back relatively normal.  Recommended admission for 24-hour observation.  Discussed with hospitalist service will admit.  [facility] accepting.  Final Clinical Impression(s) / ED Diagnoses Final diagnoses:  Colchicine overdose, accidental or unintentional, initial encounter    Rx / DC Orders ED Discharge Orders    None       Lucrezia Starch, MD 05/30/19 [phone]

## 2019-05-30 DIAGNOSIS — T504X1A Poisoning by drugs affecting uric acid metabolism, accidental (unintentional), initial encounter: Secondary | ICD-10-CM | POA: Diagnosis not present

## 2019-05-30 LAB — COMPREHENSIVE METABOLIC PANEL
ALT: 29 U/L (ref 0–44)
ALT: 34 U/L (ref 0–44)
AST: 27 U/L (ref 15–41)
AST: 30 U/L (ref 15–41)
Albumin: 4.1 g/dL (ref 3.5–5.0)
Albumin: 4.5 g/dL (ref 3.5–5.0)
Alkaline Phosphatase: 48 U/L (ref 38–126)
Alkaline Phosphatase: 53 U/L (ref 38–126)
Anion gap: 14 (ref 5–15)
Anion gap: 12 (ref 5–15)
BUN: 8 mg/dL (ref 6–20)
BUN: 10 mg/dL (ref 6–20)
CO2: 18 mmol/L — ABNORMAL LOW (ref 22–32)
CO2: 20 mmol/L — ABNORMAL LOW (ref 22–32)
Calcium: 9.2 mg/dL (ref 8.9–10.3)
Calcium: 9.5 mg/dL (ref 8.9–10.3)
Chloride: 104 mmol/L (ref 98–111)
Chloride: 104 mmol/L (ref 98–111)
Creatinine, Ser: 0.72 mg/dL (ref 0.44–1.00)
Creatinine, Ser: 0.8 mg/dL (ref 0.44–1.00)
GFR calc Af Amer: >60 mL/min (ref >60)
GFR calc Af Amer: >60 mL/min (ref >60)
GFR calc non Af Amer: >60 mL/min (ref >60)
GFR calc non Af Amer: >60 mL/min (ref >60)
Glucose, Bld: 144 mg/dL — ABNORMAL HIGH (ref 70–99)
Glucose, Bld: 152 mg/dL — ABNORMAL HIGH (ref 70–99)
Potassium: 3.9 mmol/L (ref 3.5–5.1)
Potassium: 4 mmol/L (ref 3.5–5.1)
Sodium: 136 mmol/L (ref 135–145)
Sodium: 136 mmol/L (ref 135–145)
Total Bilirubin: 0.6 mg/dL (ref 0.3–1.2)
Total Bilirubin: 0.7 mg/dL (ref 0.3–1.2)
Total Protein: 8 g/dL (ref 6.5–8.1)
Total Protein: 9.2 g/dL — ABNORMAL HIGH (ref 6.5–8.1)

## 2019-05-30 LAB — CBC
HCT: 39.9 % (ref 36.0–46.0)
HCT: 42.3 % (ref 36.0–46.0)
Hemoglobin: 12.7 g/dL (ref 12.0–15.0)
Hemoglobin: 13.5 g/dL (ref 12.0–15.0)
MCH: 29.3 pg (ref 26.0–34.0)
MCH: 29.3 pg (ref 26.0–34.0)
MCHC: 31.8 g/dL (ref 30.0–36.0)
MCHC: 31.9 g/dL (ref 30.0–36.0)
MCV: 92.1 fL (ref 80.0–100.0)
MCV: 91.8 fL (ref 80.0–100.0)
Platelets: 309 10*3/uL (ref 150–400)
Platelets: 314 10*3/uL (ref 150–400)
RBC: 4.33 MIL/uL (ref 3.87–5.11)
RBC: 4.61 MIL/uL (ref 3.87–5.11)
RDW: 14.6 % (ref 11.5–15.5)
RDW: 14.6 % (ref 11.5–15.5)
WBC: 6.7 10*3/uL (ref 4.0–10.5)
WBC: 8.5 10*3/uL (ref 4.0–10.5)
nRBC: 0 % (ref 0.0–0.2)
nRBC: 0 % (ref 0.0–0.2)

## 2019-05-30 LAB — TROPONIN I (HIGH SENSITIVITY): Troponin I (High Sensitivity): <2 ng/L (ref <18)

## 2019-05-30 LAB — PROTIME-INR
INR: 1 (ref 0.8–1.2)
Prothrombin Time: 13.3 seconds (ref 11.4–15.2)

## 2019-05-30 LAB — CK: Total CK: 84 U/L (ref 38–234)

## 2019-05-30 LAB — SARS CORONAVIRUS 2 (TAT 6-24 HRS): SARS Coronavirus 2: NEGATIVE

## 2019-05-30 MED ORDER — PREDNISONE 20 MG PO TABS: 40.0000 mg | ORAL_TABLET | Freq: Every day | ORAL | 0 refills | Status: DC

## 2019-05-30 MED ORDER — INFLUENZA VAC SPLIT QUAD 0.5 ML IM SUSY: 0.5000 mL | PREFILLED_SYRINGE | INTRAMUSCULAR | Status: DC

## 2019-05-30 MED ORDER — GERHARDT'S BUTT CREAM
TOPICAL_CREAM | Freq: Three times a day (TID) | CUTANEOUS | Status: DC | PRN
  Filled 2019-05-30: qty 1

## 2019-05-30 NOTE — Discharge Summary (Signed)
Physician Discharge Summary  Renee Roberson [id number] DOB: 10-Apr-1981 DOA: 05/29/2019  PCP: Renee Dus, MD  Admit date: 05/29/2019 Discharge date: 05/30/2019  Admitted From: Home  Disposition:  Home   Recommendations for Outpatient Follow-up:  1. Follow up with PCP in 1-2 weeks   Home Health: None  Equipment/Devices: None  Discharge Condition: Good  CODE STATUS: FULL Diet recommendation: Cardiac  Brief/Interim Summary: Mrs. Renee Roberson is a 38 y.o. F with sCHF, HTN, gout, MO and OSA on CPAP who presented with colchicine overdose.  Patient recently developed pain in burning of her left ankle, similar to prior gout.  She thought her colchicine prescription indicated to take two 0.6 mg tablets followed by one tablet per hour as needed, and so she had taken almost 10 to 12 tablets when she thought this might be an excessive amount and called her pharmacy, who recommended she contact her PCP, who recommended she come to the ER.  In the ER, electrolytes, LFTs, renal function, and hemogram were normal.  CK normal, VBG unremarkable.  EKG unremarkable.  Poison control contacted who recommended 24-hour monitoring.        PRINCIPAL HOSPITAL DIAGNOSIS: Accidental colchicine overdose    Discharge Diagnoses:  Accidental colchicine overdose The patient was monitored for 24 hours.  She had diarrhea, but no other symptoms.  Her ankle pain improved.  A 24 hours, her complete metabolic panel, renal function, and hemogram were all normal.  She felt better and was discharged home.  Gout The patient was started on prednisone for a 5-day burst.  Chronic systolic and diastolic CHF Appears euvolemic.  Hypertension  Sleep apnea  BMI 37      Discharge Instructions  Discharge Instructions    Diet - low sodium heart healthy   Complete by: As directed    Discharge instructions   Complete by: As directed    From Dr. Loleta Roberson: You were admitted for accidental overdose of  colchicine.   Your lab work remained completely normal for 24 hours, and most of the colchicine at this point is worn out of your system.    Resume your normal home medicines, but avoid colchicine for now. Instead, take prednisone 40 mg (2 tabs) for two more days and follow up with your PCP   Increase activity slowly   Complete by: As directed      Allergies as of 05/30/2019      Reactions   Fish Allergy Anaphylaxis, Hives, Swelling      Medication List    STOP taking these medications   aspirin EC 81 MG tablet     TAKE these medications   acetaminophen 500 MG tablet Commonly known as: TYLENOL Take 500 mg by mouth every 6 (six) hours as needed for mild pain.   allopurinol 100 MG tablet Commonly known as: ZYLOPRIM Take 100 mg by mouth daily.   amLODipine-benazepril 5-40 MG capsule Commonly known as: LOTREL Take 1 capsule by mouth daily. 3   aspirin-acetaminophen-caffeine 250-250-65 MG tablet Commonly known as: EXCEDRIN MIGRAINE Take 2 tablets by mouth every 6 (six) hours as needed for migraine.   carvedilol 25 MG tablet Commonly known as: COREG Take 1 tablet (25 mg total) by mouth 2 (two) times daily with a meal.   furosemide 40 MG tablet Commonly known as: LASIX Take 1 tablet (40 mg total) by mouth daily.   omeprazole 40 MG capsule Commonly known as: PRILOSEC Take 40 mg by mouth as needed (heart burn/gerd).   predniSONE 20 MG tablet  Commonly known as: DELTASONE Take 2 tablets (40 mg total) by mouth daily. Start taking on: May 31, 2019   tiZANidine 4 MG tablet Commonly known as: ZANAFLEX Take 4 mg by mouth as needed (back pain).       Allergies  Allergen Reactions  . Fish Allergy Anaphylaxis, Hives and Swelling    Consultations:  Poison control   Procedures/Studies: DG Chest Portable 1 View  Result Date: 05/29/2019 CLINICAL DATA:  Shortness of breath EXAM: PORTABLE CHEST 1 VIEW COMPARISON:  May 19, 2016 FINDINGS: No edema or  consolidation. Heart size and pulmonary vascularity are normal. No adenopathy. No bone lesions. IMPRESSION: No edema or consolidation.  Stable cardiac silhouette. Electronically Signed   By: Lowella Grip III M.D.   On: 05/29/2019 20:20      Subjective: Feels well.  No confusion, headache, chest pain, trouble breathing, orthopnea, swelling, abdominal pain, vomiting.  Ankle pain is slightly improved.  Discharge Exam: Vitals:   05/30/19 0645 05/30/19 1453  BP: (!) 149/98 (!) 149/112  Pulse: 93 99  Resp:  16  Temp:  97.9 F (36.6 C)  SpO2: 97% 95%   Vitals:   05/30/19 0123 05/30/19 0642 05/30/19 0645 05/30/19 1453  BP: (!) 153/98 (!) 154/105 (!) 149/98 (!) 149/112  Pulse: 89 95 93 99  Resp: 20 20  16   Temp: 98.1 F (36.7 C) 98.2 F (36.8 C)  97.9 F (36.6 C)  TempSrc: Oral Oral  Oral  SpO2: 99% 98% 97% 95%  Weight: 101.3 kg     Height: 5\' 5"  (1.651 m)       General: Pt is alert, awake, not in acute distress Cardiovascular: RRR, nl S1-S2, no murmurs appreciated.   No LE edema.   Respiratory: Normal respiratory rate and rhythm.  CTAB without rales or wheezes. Abdominal: Abdomen soft and non-tender.  No distension or HSM.   Neuro/Psych: Strength symmetric in upper and lower extremities.  Judgment and insight appear normal.   The results of significant diagnostics from this hospitalization (including imaging, microbiology, ancillary and laboratory) are listed below for reference.     Microbiology: Recent Results (from the past 240 hour(s))  SARS CORONAVIRUS 2 (TAT 6-24 HRS) Nasopharyngeal Nasopharyngeal Swab     Status: None   Collection Time: 05/29/19 10:33 PM   Specimen: Nasopharyngeal Swab  Result Value Ref Range Status   SARS Coronavirus 2 NEGATIVE NEGATIVE Final    Comment: (NOTE) SARS-CoV-2 target nucleic acids are NOT DETECTED. The SARS-CoV-2 RNA is generally detectable in upper and lower respiratory specimens during the acute phase of infection.  Negative results do not preclude SARS-CoV-2 infection, do not rule out co-infections with other pathogens, and should not be used as the sole basis for treatment or other patient management decisions. Negative results must be combined with clinical observations, patient history, and epidemiological information. The expected result is Negative. Fact Sheet for Patients: SugarRoll.be Fact Sheet for Healthcare Providers: https://www.woods-mathews.com/ This test is not yet approved or cleared by the [place] FDA and  has been authorized for detection and/or diagnosis of SARS-CoV-2 by FDA under an Emergency Use Authorization (EUA). This EUA will remain  in effect (meaning this test can be used) for the duration of the COVID-19 declaration under Section 56 4(b)(1) of the Act, 21 U.S.C. section 360bbb-3(b)(1), unless the authorization is terminated or revoked sooner. Performed at [facility], [place] [address] [place], [place] [address]      Labs: BNP (last 3 results) No results for  input(s): BNP in the last 8760 hours. Basic Metabolic Panel: Recent Labs  Lab 05/29/19 1956 05/30/19 0512 05/30/19 1511  NA 141 136 136  K 3.6 3.9 4.0  CL 107 104 104  CO2 23 18* 20*  GLUCOSE 106* 144* 152*  BUN 10 8 10   CREATININE 0.74 0.72 0.80  CALCIUM 9.5 9.2 9.5   Liver Function Tests: Recent Labs  Lab 05/29/19 1956 05/30/19 0512 05/30/19 1511  AST 24 27 30   ALT 26 29 34  ALKPHOS 50 48 53  BILITOT 1.1 0.6 0.7  PROT 8.2* 8.0 9.2*  ALBUMIN 4.7 4.1 4.5   No results for input(s): LIPASE, AMYLASE in the last 168 hours. No results for input(s): AMMONIA in the last 168 hours. CBC: Recent Labs  Lab 05/29/19 1956 05/30/19 0512 05/30/19 1511  WBC 8.1 6.7 8.5  NEUTROABS 3.8  --   --   HGB 13.0 12.7 13.5  HCT 40.3 39.9 42.3  MCV 91.6 92.1 91.8  PLT 302 309 314   Cardiac Enzymes: Recent Labs  Lab 05/29/19 2006 05/30/19 0512   CKTOTAL 108 84   BNP: Invalid input(s): POCBNP CBG: No results for input(s): GLUCAP in the last 168 hours. D-Dimer No results for input(s): DDIMER in the last 72 hours. Hgb A1c No results for input(s): HGBA1C in the last 72 hours. Lipid Profile No results for input(s): CHOL, HDL, LDLCALC, TRIG, CHOLHDL, LDLDIRECT in the last 72 hours. Thyroid function studies No results for input(s): TSH, T4TOTAL, T3FREE, THYROIDAB in the last 72 hours.  Invalid input(s): FREET3 Anemia work up No results for input(s): VITAMINB12, FOLATE, FERRITIN, TIBC, IRON, RETICCTPCT in the last 72 hours. Urinalysis    Component Value Date/Time   COLORURINE YELLOW 05/29/2019 1957   APPEARANCEUR CLOUDY (A) 05/29/2019 1957   LABSPEC 1.017 05/29/2019 1957   PHURINE 5.0 05/29/2019 1957   GLUCOSEU NEGATIVE 05/29/2019 1957   HGBUR MODERATE (A) 05/29/2019 [place] NEGATIVE 05/29/2019 1957   BILIRUBINUR n 08/12/2017 [place] 05/29/2019 1957   PROTEINUR 100 (A) 05/29/2019 1957   UROBILINOGEN negative (A) 08/12/2017 1330   NITRITE NEGATIVE 05/29/2019 1957   LEUKOCYTESUR LARGE (A) 05/29/2019 1957   Sepsis Labs Invalid input(s): PROCALCITONIN,  WBC,  LACTICIDVEN Microbiology Recent Results (from the past 240 hour(s))  SARS CORONAVIRUS 2 (TAT 6-24 HRS) Nasopharyngeal Nasopharyngeal Swab     Status: None   Collection Time: 05/29/19 10:33 PM   Specimen: Nasopharyngeal Swab  Result Value Ref Range Status   SARS Coronavirus 2 NEGATIVE NEGATIVE Final    Comment: (NOTE) SARS-CoV-2 target nucleic acids are NOT DETECTED. The SARS-CoV-2 RNA is generally detectable in upper and lower respiratory specimens during the acute phase of infection. Negative results do not preclude SARS-CoV-2 infection, do not rule out co-infections with other pathogens, and should not be used as the sole basis for treatment or other patient management decisions. Negative results must be combined with clinical  observations, patient history, and epidemiological information. The expected result is Negative. Fact Sheet for Patients: SugarRoll.be Fact Sheet for Healthcare Providers: https://www.woods-mathews.com/ This test is not yet approved or cleared by the [place] FDA and  has been authorized for detection and/or diagnosis of SARS-CoV-2 by FDA under an Emergency Use Authorization (EUA). This EUA will remain  in effect (meaning this test can be used) for the duration of the COVID-19 declaration under Section 56 4(b)(1) of the Act, 21 U.S.C. section 360bbb-3(b)(1), unless the authorization is terminated or revoked sooner. Performed at  [facility], [place] [address], [place], [place] [address]      Time coordinating discharge: 35 minutes      SIGNED:   Edwin Dada, MD  [facility] 05/30/2019, 4:28 PM

## 2019-05-30 NOTE — Progress Notes (Signed)
Order noted for nocturnal CPAP. Held at this time for pending SARS CORONAVIRUS 2 lab result. RT will continue to follow.

## 2019-05-30 NOTE — Progress Notes (Signed)
Lennette Bihari from [employer] phoned to check on patient, updated on patient status, patient lab work, Lennette Bihari stated patient would need to stay for observation for 24 hours from time patient took last dose.  Last dose taken apx 1600 12/28.

## 2019-06-05 DIAGNOSIS — T504X1D Poisoning by drugs affecting uric acid metabolism, accidental (unintentional), subsequent encounter: Secondary | ICD-10-CM | POA: Diagnosis not present

## 2019-06-05 DIAGNOSIS — M109 Gout, unspecified: Secondary | ICD-10-CM | POA: Diagnosis not present

## 2019-06-05 DIAGNOSIS — I10 Essential (primary) hypertension: Secondary | ICD-10-CM | POA: Diagnosis not present

## 2019-06-22 NOTE — Progress Notes (Deleted)
CARDIOLOGY OFFICE NOTE  Date:  06/22/2019    Renee Roberson Date of Birth: 08/04/1980 Medical Record [id number]  PCP:  Maury Dus, MD  Cardiologist:  Servando Snare & ***    No chief complaint on file.   History of Present Illness: Renee Roberson is a 39 y.o. female who presents today for a *** Seen for Dr. Tamala Julian - sees Dr. Radford Pax for sleep.   She has a history ofHTN, PCOS, obesity, chest pain,and chronic combined systolic and diastolic HF. Her previous ejection fraction was 25% in September 2014, and this improved to 40-45% by March 2015 and then normalized to EF of 60 to 65% per echo in 12/2016.   I last saw her in August of 2019.   She had a telehealth visit with Richardson Dopp, PA last month - she had had several issues - this included palpitations, chest pain, weight gain and dizziness after walking. He obtained a coronary CT - this was reassuring. There was discussion for possible event monitor.   The patient does not have symptoms concerning for COVID-19 infection (fever, chills, cough, or new shortness of breath).   She is seen today by a telephone visit. Her video is not working. She has previously consented for this visit. She called up here yesterday - she did not feel well with the CT - got short of breath and dizzy - BP apparently low - feels better today. She was called back and had a message left to call back - she did not get this. BP is up today - she has not had any of her medicines yet today. Wasn't sure what to do today. She notes her gout is better now that she is on medicines. She denies any chest pain at this time. Says she is not short of breath. She has stopped her exercise.   The patient {does/does not:200015} have symptoms concerning for COVID-19 infection (fever, chills, cough, or new shortness of breath).   Comes in today. Here with   Past Medical History:  Diagnosis Date  . Abnormal Pap smear of cervix   . Accelerated essential  hypertension 03/20/2013   Left ventricular systolic dysfunction as a complication   . Amenorrhea   . Chest pain    Coronary CTA 04/2019: Calcium score 0; no evidence of CAD  . CHF (congestive heart failure) ([place])   . Chronic combined systolic and diastolic heart failure ([place]) 11/13/2013   LVEF improved from 25% to 45% after 6 months of antihypertensive therapy   . Daytime sleepiness 08/17/2016  . Depression   . Diverticulitis of large intestine 12/30/2015  . GERD (gastroesophageal reflux disease)   . Gout   . Hepatic steatosis   . Hepatomegaly   . HTN (hypertension)   . Hypotension 08/20/2014  . Infertility, female   . Migraine   . Morbid obesity ([place]) 03/20/2013  . OSA on CPAP 02/21/2018   Home sleep study showed severe obstructive sleep apnea with an AHI of 62.3 on night 1 and 92.0 and night to with oxygen desaturations less than 70% for 1 minute.  A total of 1 hour and 22 minutes was noted with O2 sats less than 89%.  52% of the time was spent with oxygen saturations less than 90%. Now on CPAP at 14 cm H2O   . PCOS (polycystic ovarian syndrome)   . Snoring 08/17/2016  . STD (sexually transmitted disease)    HSV (genital)  . Systolic heart failure ([place])  Past Surgical History:  Procedure Laterality Date  . COLPOSCOPY  2015  . no surgical hx       Medications: No outpatient medications have been marked as taking for the 06/27/19 encounter (Appointment) with [name], NP.     Allergies: Allergies  Allergen Reactions  . Fish Allergy Anaphylaxis, Hives and Swelling    Social History: The patient  reports that she has never smoked. She has never used smokeless tobacco. She reports that she does not drink alcohol or use drugs.   Family History: The patient's ***family history includes Cancer in her maternal grandmother; Diabetes in her paternal grandfather; Hypertension in her father, mother, and sister; Thyroid disease in her father.   Review of Systems: Please  see the history of present illness.   All other systems are reviewed and negative.   Physical Exam: VS:  There were no vitals taken for this visit. Marland Kitchen  BMI There is no height or weight on file to calculate BMI.  Wt Readings from Last 3 Encounters:  05/30/19 223 lb 5.2 oz (101.3 kg)  04/19/19 243 lb 3.2 oz (110.3 kg)  03/28/19 243 lb (110.2 kg)    General: Pleasant. Well developed, well nourished and in no acute distress.   HEENT: Normal.  Neck: Supple, no JVD, carotid bruits, or masses noted.  Cardiac: ***Regular rate and rhythm. No murmurs, rubs, or gallops. No edema.  Respiratory:  Lungs are clear to auscultation bilaterally with normal work of breathing.  GI: Soft and nontender.  MS: No deformity or atrophy. Gait and ROM intact.  Skin: Warm and dry. Color is normal.  Neuro:  Strength and sensation are intact and no gross focal deficits noted.  Psych: Alert, appropriate and with normal affect.   LABORATORY DATA:  EKG:  EKG {ACTION; IS/IS [id number] ordered today. This demonstrates ***.  Lab Results  Component Value Date   WBC 8.5 05/30/2019   HGB 13.5 05/30/2019   HCT 42.3 05/30/2019   PLT 314 05/30/2019   GLUCOSE 152 (H) 05/30/2019   ALT 34 05/30/2019   AST 30 05/30/2019   NA 136 05/30/2019   K 4.0 05/30/2019   CL 104 05/30/2019   CREATININE 0.80 05/30/2019   BUN 10 05/30/2019   CO2 20 (L) 05/30/2019   TSH 1.49 01/02/2015   INR 1.0 05/30/2019     BNP (last 3 results) No results for input(s): BNP in the last 8760 hours.  ProBNP (last 3 results) Recent Labs    03/28/19 1214  PROBNP 36     Other Studies Reviewed Today:   Assessment/Plan: CT CORONARY IMPRESSION 04/2019: 1. Coronary calcium score of 0. This was 0 percentile for age and sex matched control.  2. Normal coronary origin with right dominance.  3. No evidence of CAD. CAD-RADS 0. No evidence of CAD (0%). Consider non-atherosclerotic causes of chest pain.  Electronically Signed By:  Ena Dawley On: 04/17/2019 21:50   EchoStudy Conclusions8/2018  - Left ventricle: The cavity size was normal. There was mild concentric hypertrophy. Systolic function was normal. The estimated ejection fraction was in the range of 60% to 65%. Wall motion was normal; there were no regional wall motion abnormalities. Features are consistent with a pseudonormal left ventricular filling pattern, with concomitant abnormal relaxation and increased filling pressure (grade 2 diastolic dysfunction). - Left atrium: The atrium was mildly dilated. - Pulmonary arteries: Systolic pressure could not be accurately estimated.   Assessment/Plan:  1. Exertional chest pain/multiple somatic complaints -  Has had  reassuring coronary CT - ok to resume her medicines. Needs to work on life style - discussed at length with her today.   2. Chronic combined systolic and diastolic HR- she has had normalization of her EF by her last echo from 2018 - she currently has no symptoms.   3. Morbid obesity - this is always a struggle for her - we talked about referral for bariatric surgery. She is interested in this option. Also discussed decreasing calories with regular activity. Ok to get back to exercise. Referral placed to see Dr. Hassell Done.   4. OSA - on CPAP - not discussed.   5. HTN - BP is up today - she is going to resume her medicines.   6. Palpitations - not really endorsed.  If this returns, we can place event monitor.   7. Dizziness - not endorsed.   8. Gout - seems better - now on allopurinol.   Marland Kitchen COVID-19 Education: The signs and symptoms of COVID-19 were discussed with the patient and how to seek care for testing (follow up with PCP or arrange E-visit).  The importance of social distancing, staying at home, hand hygiene and wearing a mask when out in public were discussed today.  Current medicines are reviewed with the patient today.  The patient does not have  concerns regarding medicines other than what has been noted above.  The following changes have been made:  See above.  Labs/ tests ordered today include:   No orders of the defined types were placed in this encounter.    Disposition:   FU with *** in {gen number [id number] {Days to years:10300}.   Patient is agreeable to this plan and will call if any problems develop in the interim.   SignedTruitt Merle, NP  06/22/2019 4:09 PM  [facility] [address] [place], [place]  [address] Phone: [phone] Fax: [phone]

## 2019-06-26 NOTE — Progress Notes (Signed)
Telehealth Visit     Virtual Visit via Video Note   This visit type was conducted due to national recommendations for restrictions regarding the COVID-19 Pandemic (e.g. social distancing) in an effort to limit this patient's exposure and mitigate transmission in our community.  Due to her co-morbid illnesses, this patient is at least at moderate risk for complications without adequate follow up.  This format is felt to be most appropriate for this patient at this time.  All issues noted in this document were discussed and addressed.  A limited physical exam was performed with this format.  Please refer to the patient's chart for her consent to telehealth for [facility].   Evaluation Performed:  Follow-up visit  This visit type was conducted due to national recommendations for restrictions regarding the COVID-19 Pandemic (e.g. social distancing).  This format is felt to be most appropriate for this patient at this time.  All issues noted in this document were discussed and addressed.  No physical exam was performed (except for noted visual exam findings with Video Visits).  Please refer to the patient's chart (MyChart message for video visits and phone note for telephone visits) for the patient's consent to telehealth for [facility].  Date:  06/27/2019   ID:  Renee Roberson, DOB June 02, 1980, MRN [id number]  Patient Location:  Home  Provider location:   [facility] Office  PCP:  Maury Dus, MD  Cardiologist:  Servando Snare & [name], MD  Electrophysiologist:  None   Chief Complaint:  Follow up.   History of Present Illness:    Renee Roberson is a 39 y.o. female who presents via audio/video conferencing for a telehealth visit today.  Seen for Tamala Julian - sees Dr. Radford Pax for sleep.   She has a history ofHTN, PCOS, obesity, chest pain,and chronic combined systolic and diastolic HF. A prior ejection fraction was 25% in September 2014, and this improved to 40-45% by March  2015 and then normalized to EF of 60 to 65% per echo in 12/2016. She continues to have diastolic dysfunction.   I last saw her in the office back in August of 2019.   She had a telehealth visit with Richardson Dopp, PA in October - she had had several issues - this included palpitations, chest pain, weight gain and dizziness after walking. He obtained a coronary CT - this was reassuring. There was discussion for possible event monitor. I then had a telehealth visit with her in November - she did not feel well after her CT scan - BP was low but then back up by the time of our visit. She was going to restart her medicines. No further chest pain. I referred her for bariatric surgery consideration.   Noted she was admitted towards the end of December with accidental colchicine overdose. She was treated with charcoal.   The patient does not have symptoms concerning for COVID-19 infection (fever, chills, cough, or new shortness of breath).   Seen today via [employer] video. She has consented for this visit. She has had several recent deaths - trying to cope with that. Otherwise, doing ok. She says her BP is typically much better than the reading for today. She is trying to eat better. Weight remains an issue. We talked about her admission last month with the Colchicine overdose - she took about 11 tablets - did not realize there was a limit. No chest pain. Breathing is good. She has no real concerns.   Past Medical  History:  Diagnosis Date   Abnormal Pap smear of cervix    Accelerated essential hypertension 03/20/2013   Left ventricular systolic dysfunction as a complication    Amenorrhea    Chest pain    Coronary CTA 04/2019: Calcium score 0; no evidence of CAD   CHF (congestive heart failure) ([facility])    Chronic combined systolic and diastolic heart failure ([place]) 11/13/2013   LVEF improved from 25% to 45% after 6 months of antihypertensive therapy    Daytime sleepiness 08/17/2016   Depression      Diverticulitis of large intestine 12/30/2015   GERD (gastroesophageal reflux disease)    Gout    Hepatic steatosis    Hepatomegaly    HTN (hypertension)    Hypotension 08/20/2014   Infertility, female    Migraine    Morbid obesity ([place]) 03/20/2013   OSA on CPAP 02/21/2018   Home sleep study showed severe obstructive sleep apnea with an AHI of 62.3 on night 1 and 92.0 and night to with oxygen desaturations less than 70% for 1 minute.  A total of 1 hour and 22 minutes was noted with O2 sats less than 89%.  52% of the time was spent with oxygen saturations less than 90%. Now on CPAP at 14 cm H2O    PCOS (polycystic ovarian syndrome)    Snoring 08/17/2016   STD (sexually transmitted disease)    HSV (genital)   Systolic heart failure ([place])    Past Surgical History:  Procedure Laterality Date   COLPOSCOPY  2015   no surgical hx       Current Meds  Medication Sig   acetaminophen (TYLENOL) 500 MG tablet Take 500 mg by mouth every 6 (six) hours as needed for mild pain.   allopurinol (ZYLOPRIM) 100 MG tablet Take 100 mg by mouth daily.   amLODipine-benazepril (LOTREL) 5-40 MG capsule Take 1 capsule by mouth daily. 3   aspirin-acetaminophen-caffeine (EXCEDRIN MIGRAINE) 250-250-65 MG tablet Take 2 tablets by mouth every 6 (six) hours as needed for migraine.   carvedilol (COREG) 25 MG tablet Take 1 tablet (25 mg total) by mouth 2 (two) times daily with a meal.   furosemide (LASIX) 40 MG tablet Take 1 tablet (40 mg total) by mouth daily.   omeprazole (PRILOSEC) 40 MG capsule Take 40 mg by mouth as needed (heart burn/gerd).   tiZANidine (ZANAFLEX) 4 MG tablet Take 4 mg by mouth as needed (back pain).      Allergies:   Fish allergy   Social History   Tobacco Use   Smoking status: Never Smoker   Smokeless tobacco: Never Used  Substance Use Topics   Alcohol use: No    Alcohol/week: 0.0 standard drinks   Drug use: No     Family Hx: The patient's family  history includes Cancer in her maternal grandmother; Diabetes in her paternal grandfather; Hypertension in her father, mother, and sister; Thyroid disease in her father.  ROS:   Please see the history of present illness.   All other systems reviewed are negative.    Objective:    Vital Signs:  BP (!) 147/94    Pulse 80    Ht 5\' 5"  (1.651 m)    Wt 241 lb 4.8 oz (109.5 kg)    BMI 40.15 kg/m    Wt Readings from Last 3 Encounters:  06/27/19 241 lb 4.8 oz (109.5 kg)  05/30/19 223 lb 5.2 oz (101.3 kg)  04/19/19 243 lb 3.2 oz (110.3 kg)  Alert female in no acute distress. She is not short of breath with conversation. Alert and appropriate.    Labs/Other Tests and Data Reviewed:    Lab Results  Component Value Date   WBC 8.5 05/30/2019   HGB 13.5 05/30/2019   HCT 42.3 05/30/2019   PLT 314 05/30/2019   GLUCOSE 152 (H) 05/30/2019   ALT 34 05/30/2019   AST 30 05/30/2019   NA 136 05/30/2019   K 4.0 05/30/2019   CL 104 05/30/2019   CREATININE 0.80 05/30/2019   BUN 10 05/30/2019   CO2 20 (L) 05/30/2019   TSH 1.49 01/02/2015   INR 1.0 05/30/2019        BNP (last 3 results) No results for input(s): BNP in the last 8760 hours.  ProBNP (last 3 results) Recent Labs    03/28/19 1214  PROBNP 36      Prior CV studies:    The following studies were reviewed today:  CT CORONARY IMPRESSION 04/2019: 1. Coronary calcium score of 0. This was 0 percentile for age and sex matched control.  2. Normal coronary origin with right dominance.  3. No evidence of CAD. CAD-RADS 0. No evidence of CAD (0%). Consider non-atherosclerotic causes of chest pain.  Electronically Signed By: Ena Dawley On: 04/17/2019 21:50   EchoStudy Conclusions8/2018  - Left ventricle: The cavity size was normal. There was mild concentric hypertrophy. Systolic function was normal. The estimated ejection fraction was in the range of 60% to 65%. Wall motion was normal; there  were no regional wall motion abnormalities. Features are consistent with a pseudonormal left ventricular filling pattern, with concomitant abnormal relaxation and increased filling pressure (grade 2 diastolic dysfunction). - Left atrium: The atrium was mildly dilated. - Pulmonary arteries: Systolic pressure could not be accurately estimated.   Assessment/Plan:  1. HTN - she notes her BP is typically better than today's reading - she will continue to monitor.   2. Chronic combined systolic and diastolic HF - appears stable. No changes made.   3. Prior chest pain - reassuring CT noted - has not recurred.   4. Obesity - remains an issue.   5. Gout with recent accidental Colchicine overdose - no adverse sequela noted.   6. OSA - using her CPAP  7. History of palpitations - not endorsed today.  8. COVID-19 Education: The signs and symptoms of COVID-19 were discussed with the patient and how to seek care for testing (follow up with PCP or arrange E-visit).  The importance of social distancing, staying at home, hand hygiene and wearing a mask when out in public were discussed today.  Patient Risk:   After full review of this patient's clinical status, I feel that they are at least moderate risk at this time.  Time:   Today, I have spent 9 minutes with the patient with telehealth technology discussing the above issues.     Medication Adjustments/Labs and Tests Ordered: Current medicines are reviewed at length with the patient today.  Concerns regarding medicines are outlined above.   Tests Ordered: No orders of the defined types were placed in this encounter.   Medication Changes: No orders of the defined types were placed in this encounter.   Disposition:  FU with [place] in 6 months.   Patient is agreeable to this plan and will call if any problems develop in the interim.   Amie Critchley, NP  06/27/2019 8:15 AM    [place]

## 2019-06-27 ENCOUNTER — Telehealth (INDEPENDENT_AMBULATORY_CARE_PROVIDER_SITE_OTHER): Payer: BC Managed Care – PPO | Admitting: Nurse Practitioner

## 2019-06-27 ENCOUNTER — Encounter: Payer: Self-pay | Admitting: Nurse Practitioner

## 2019-06-27 ENCOUNTER — Telehealth: Payer: Self-pay | Admitting: *Deleted

## 2019-06-27 ENCOUNTER — Other Ambulatory Visit: Payer: Self-pay

## 2019-06-27 VITALS — BP 147/94 | HR 80 | Ht 65.0 in | Wt 241.3 lb

## 2019-06-27 DIAGNOSIS — R0602 Shortness of breath: Secondary | ICD-10-CM

## 2019-06-27 DIAGNOSIS — I110 Hypertensive heart disease with heart failure: Secondary | ICD-10-CM

## 2019-06-27 DIAGNOSIS — I5022 Chronic systolic (congestive) heart failure: Secondary | ICD-10-CM | POA: Diagnosis not present

## 2019-06-27 DIAGNOSIS — Z7189 Other specified counseling: Secondary | ICD-10-CM

## 2019-06-27 NOTE — Telephone Encounter (Signed)
lvm for pt to get ready for telehealth visit appt at 8:00 am. Will try back in a couple minutes.

## 2019-06-27 NOTE — Patient Instructions (Addendum)
After Visit Summary:  We will be checking the following labs today - NONE   Medication Instructions:    Continue with your current medicines.    If you need a refill on your cardiac medications before your next appointment, please call your pharmacy.     Testing/Procedures To Be Arranged:  N/A  Follow-Up:   See [place] in 6 months    At [facility], you and your health needs are our priority.  As part of our continuing mission to provide you with exceptional heart care, we have created designated Provider Care Teams.  These Care Teams include your primary Cardiologist (physician) and Advanced Practice Providers (APPs -  Physician Assistants and Nurse Practitioners) who all work together to provide you with the care you need, when you need it.  Special Instructions:  . Stay safe, stay home, wash your hands for at least 20 seconds and wear a mask when out in public.  . It was good to talk with you today.    Call the [place] office at [phone] if you have any questions, problems or concerns.

## 2019-08-18 ENCOUNTER — Encounter: Payer: Self-pay | Admitting: Certified Nurse Midwife

## 2019-09-21 DIAGNOSIS — M545 Low back pain: Secondary | ICD-10-CM | POA: Diagnosis not present

## 2019-09-21 DIAGNOSIS — R1032 Left lower quadrant pain: Secondary | ICD-10-CM | POA: Diagnosis not present

## 2019-09-22 ENCOUNTER — Other Ambulatory Visit: Payer: Self-pay

## 2019-09-22 ENCOUNTER — Ambulatory Visit
Admission: RE | Admit: 2019-09-22 | Discharge: 2019-09-22 | Disposition: A | Discharge status: Home / Self Care | Payer: BC Managed Care – PPO | Source: Ambulatory Visit | Attending: Family Medicine | Admitting: Family Medicine

## 2019-09-22 ENCOUNTER — Other Ambulatory Visit: Payer: Self-pay | Admitting: Family Medicine

## 2019-09-22 DIAGNOSIS — R1032 Left lower quadrant pain: Secondary | ICD-10-CM

## 2019-09-22 DIAGNOSIS — R109 Unspecified abdominal pain: Secondary | ICD-10-CM | POA: Diagnosis not present

## 2019-09-22 MED ORDER — IOPAMIDOL (ISOVUE-300) INJECTION 61%
100.0000 mL | Freq: Once | INTRAVENOUS | Status: AC | PRN
  Administered 2019-09-22: 100 mL via INTRAVENOUS

## 2019-09-27 ENCOUNTER — Other Ambulatory Visit: Payer: Self-pay | Admitting: Family Medicine

## 2019-09-29 ENCOUNTER — Other Ambulatory Visit: Payer: Self-pay | Admitting: Family Medicine

## 2019-09-29 DIAGNOSIS — N2889 Other specified disorders of kidney and ureter: Secondary | ICD-10-CM

## 2019-11-02 DIAGNOSIS — Z3689 Encounter for other specified antenatal screening: Secondary | ICD-10-CM | POA: Diagnosis not present

## 2019-11-02 DIAGNOSIS — Z3200 Encounter for pregnancy test, result unknown: Secondary | ICD-10-CM | POA: Diagnosis not present

## 2019-11-07 DIAGNOSIS — Z3201 Encounter for pregnancy test, result positive: Secondary | ICD-10-CM | POA: Diagnosis not present

## 2019-11-10 ENCOUNTER — Telehealth: Payer: Self-pay | Admitting: Interventional Cardiology

## 2019-11-10 NOTE — Telephone Encounter (Signed)
Amlodipine/benazapril is contraindicated in pregnancy.    As for the rest of her medication list, harm to fetus can not be ruled out.

## 2019-11-10 NOTE — Telephone Encounter (Signed)
New message  Patient has found out that she is pregnant and wants to review her medications.   Pt c/o medication issue:  1. Name of Medication:  carvedilol (COREG) 25 MG tablet amLODipine-benazepril (LOTREL) 5-40 MG capsule furosemide (LASIX) 40 MG tablet allopurinol (ZYLOPRIM) 100 MG tablet omeprazole (PRILOSEC) 40 MG capsule tiZANidine (ZANAFLEX) 4 MG tablet aspirin-acetaminophen-caffeine (EXCEDRIN MIGRAINE) 250-250-65 MG tablet  2. How are you currently taking this medication (dosage and times per day)? As directed  3. Are you having a reaction (difficulty breathing--STAT)? No, patient is pregnant  4. What is your medication issue? Patient has been told she was pregnant today and would like to know if she should continue taking medication. Please give a call today so that she knows if she should take nigh dose of medication.

## 2019-11-10 NOTE — Telephone Encounter (Signed)
Gerald Stabs, [facility] spoke with me and mentioned pt needs STAT OB appt as well to help with medications to keep mom and baby safe.  Spoke with pt and made her aware of information from pharmacy team and recommendation for appt.  Pt has appt with OB next week.  She will speak with them next week to help with medications.

## 2019-11-10 NOTE — Telephone Encounter (Signed)
Will route to PharmD to see if any of pt's cardiac meds are contraindicated in pregnancy.

## 2019-11-15 ENCOUNTER — Other Ambulatory Visit: Payer: BC Managed Care – PPO

## 2019-11-16 ENCOUNTER — Ambulatory Visit (HOSPITAL_BASED_OUTPATIENT_CLINIC_OR_DEPARTMENT_OTHER): Payer: BC Managed Care – PPO | Admitting: *Deleted

## 2019-11-16 ENCOUNTER — Other Ambulatory Visit (HOSPITAL_COMMUNITY): Payer: Self-pay | Admitting: Obstetrics and Gynecology

## 2019-11-16 ENCOUNTER — Ambulatory Visit: Payer: BC Managed Care – PPO | Attending: Obstetrics and Gynecology

## 2019-11-16 ENCOUNTER — Other Ambulatory Visit: Payer: Self-pay

## 2019-11-16 ENCOUNTER — Other Ambulatory Visit: Payer: Self-pay | Admitting: *Deleted

## 2019-11-16 ENCOUNTER — Other Ambulatory Visit: Payer: Self-pay | Admitting: Obstetrics and Gynecology

## 2019-11-16 VITALS — BP 134/87 | HR 87 | Wt 241.0 lb

## 2019-11-16 DIAGNOSIS — Z79899 Other long term (current) drug therapy: Secondary | ICD-10-CM | POA: Insufficient documentation

## 2019-11-16 DIAGNOSIS — O99411 Diseases of the circulatory system complicating pregnancy, first trimester: Secondary | ICD-10-CM | POA: Diagnosis not present

## 2019-11-16 DIAGNOSIS — O10111 Pre-existing hypertensive heart disease complicating pregnancy, first trimester: Secondary | ICD-10-CM | POA: Insufficient documentation

## 2019-11-16 DIAGNOSIS — Z8719 Personal history of other diseases of the digestive system: Secondary | ICD-10-CM

## 2019-11-16 DIAGNOSIS — Z9989 Dependence on other enabling machines and devices: Secondary | ICD-10-CM | POA: Insufficient documentation

## 2019-11-16 DIAGNOSIS — Z3A12 12 weeks gestation of pregnancy: Secondary | ICD-10-CM | POA: Diagnosis not present

## 2019-11-16 DIAGNOSIS — O26831 Pregnancy related renal disease, first trimester: Secondary | ICD-10-CM | POA: Insufficient documentation

## 2019-11-16 DIAGNOSIS — O26891 Other specified pregnancy related conditions, first trimester: Secondary | ICD-10-CM | POA: Insufficient documentation

## 2019-11-16 DIAGNOSIS — Z3689 Encounter for other specified antenatal screening: Secondary | ICD-10-CM | POA: Diagnosis not present

## 2019-11-16 DIAGNOSIS — I2510 Atherosclerotic heart disease of native coronary artery without angina pectoris: Secondary | ICD-10-CM

## 2019-11-16 DIAGNOSIS — I110 Hypertensive heart disease with heart failure: Secondary | ICD-10-CM | POA: Insufficient documentation

## 2019-11-16 DIAGNOSIS — M109 Gout, unspecified: Secondary | ICD-10-CM | POA: Insufficient documentation

## 2019-11-16 DIAGNOSIS — O09521 Supervision of elderly multigravida, first trimester: Secondary | ICD-10-CM | POA: Diagnosis not present

## 2019-11-16 DIAGNOSIS — I5040 Unspecified combined systolic (congestive) and diastolic (congestive) heart failure: Secondary | ICD-10-CM

## 2019-11-16 DIAGNOSIS — O0990 Supervision of high risk pregnancy, unspecified, unspecified trimester: Secondary | ICD-10-CM

## 2019-11-16 DIAGNOSIS — O283 Abnormal ultrasonic finding on antenatal screening of mother: Secondary | ICD-10-CM | POA: Diagnosis not present

## 2019-11-16 DIAGNOSIS — N2889 Other specified disorders of kidney and ureter: Secondary | ICD-10-CM

## 2019-11-16 DIAGNOSIS — G4730 Sleep apnea, unspecified: Secondary | ICD-10-CM | POA: Insufficient documentation

## 2019-11-16 DIAGNOSIS — Z7952 Long term (current) use of systemic steroids: Secondary | ICD-10-CM | POA: Diagnosis not present

## 2019-11-16 DIAGNOSIS — O0991 Supervision of high risk pregnancy, unspecified, first trimester: Secondary | ICD-10-CM | POA: Diagnosis not present

## 2019-11-16 DIAGNOSIS — Z3682 Encounter for antenatal screening for nuchal translucency: Secondary | ICD-10-CM

## 2019-11-17 ENCOUNTER — Other Ambulatory Visit: Payer: Self-pay | Admitting: *Deleted

## 2019-11-17 NOTE — Progress Notes (Unsigned)
[id number] 

## 2019-11-20 NOTE — Addendum Note (Signed)
Addended by: Ulice Dash on: 11/20/2019 05:17 PM   Modules accepted: Level of Service

## 2019-11-20 NOTE — Progress Notes (Signed)
MFM Consult note  Renee Roberson is a 39 year old gravida 2 para 0-0-1-0 with an EDC of May 28, 2020 who is currently at 12 weeks and 2 days.  She was seen in consultation due to a history of systolic and diastolic heart failure.  She reports that she first went into heart failure in 2014.  Her ejection fraction at that time was 25%.  With treatment, her ejection fraction increased to 40% to 45% by 2015.  Her ejection fraction increased to 60% to 65% in 2018.  The patient reports that she has not had an echocardiogram performed since that time.  She reports that uncontrolled chronic hypertension is the most likely cause of her heart failure.  She denies any history of a myocardial infarction.   She is currently treated with carvedilol and furosemide for management of her heart failure.  She reports that she just notified her cardiologist that she was pregnant last week.  Her cardiologist advised her to discontinue the combination ACE inhibitor with amlodipine pill that she was taking.  She has not seen a cardiologist yet in her current pregnancy.  She reports that she is currently asymptomatic and is only short of breath when she exercises (New York Heart Association class I).    Aside from heart failure, her pregnancy has also been complicated by advanced maternal age, history of coronary artery disease, sleep apnea requiring a CPAP machine, poorly controlled chronic hypertension, gout, and diverticulitis.  She had a CT scan performed in April 2021 that showed diverticulitis and a mass that was suspicious for renal cell carcinoma.  She reports that she was scheduled to have an MRI performed yesterday to evaluate the renal mass.  However, the radiologist canceled the MRI once she told them she was pregnant.  She has not had a biopsy to rule out renal cell carcinoma.  A recent first trimester ultrasound performed in your office showed a possible increased nuchal translucency.  She subsequently had a cell  free DNA test which indicated a low risk for trisomy 21, 18, and 13.  A female fetus is predicted.  Obstetrical history includes one prior first trimester miscarriage  Past medical history systolic and diastolic heart failure, coronary artery disease, sleep apnea, poorly controlled hypertension, gout, and diverticulitis.  She denies any past surgical history.  Current medications: carvedilol, furosemide, omeprazole, tizanidine as needed, prednisone as needed for a gout flare, and colchicine as needed.   Heart failure in pregnancy The implications and management of heart failure in pregnancy was discussed in detail with the patient.  As she is currently asymptomatic (New York Heart Association class I), most women in this category tend to do well in pregnancy and may have a good outcome.  She was advised that there is an increased risk that her heart failure symptoms and other cardiac issues may worsen during pregnancy due to the physiologic changes associated with pregnancy (increase in blood volume and heart rate).  She was advised to see her cardiologist as soon as possible for an echocardiogram to determine her ejection fraction and to be placed on the appropriate medications for management of heart failure during pregnancy.    She was advised that beta-blockers such as carvedilol, calcium channel blockers, and diuretics such as furosemide may be taken during pregnancy.  Hydralazine may be used as an afterload reducer and is considered safe in pregnancy.  She was advised that ACE inhibitors or angiotensin receptor blockers are contraindicated in pregnancy.  ACE inhibitor use in pregnancy  has been associated with birth defects in the fetus such as kidney abnormalities, oligohydramnios, and abnormalities of the fetal skull.  As she was taking an ACE inhibitor combination during the first trimester, she should have a detailed fetal anatomy scan and fetal echocardiogram performed to screen for any birth  defects. There have been no specific birth defects associated with ACE inhibitor use in the first trimester of pregnancy, although retrospective studies have identified an increased risk of adverse outcomes when an ACE inhibitor was used during the first trimester.    Her pregnancy should be discussed at the monthly multidisciplinary meeting for patients with cardiac disease in pregnancy, in order to develop and formulate a delivery plan.  She should probably have an anesthesia consult prior to delivery.  Anesthesia and cardiology should be consulted to determine if any invasive central monitoring is necessary to help with fluid management during her delivery.  Due to the potential for increased maternal morbidity and mortality in women with heart failure in pregnancy, she was also advised to discuss with her partner if they want to proceed with the pregnancy.  She probably will not require prophylactic anticoagulation if her cardiac output remains in the normal range.  The patient was advised that we will await the recommendations from her cardiologist regarding the medications she should be treated with during pregnancy for management of her heart failure.  We are available to discuss with her cardiologist whether a certain medication is safe to take in pregnancy.  She was advised that we will continue to follow her with monthly ultrasounds and will continue to provide management recommendations throughout her pregnancy.  Gout and pregnancy She was advised that there is very little information to help guide the management of gout during pregnancy.  She was advised that prednisone may be safely taken during pregnancy for an acute gout flare.  Although there is limited information regarding the use of colchicine in pregnancy, it does not appear to be associated with significant birth defects.  Advanced maternal age and thickened nuchal translucency The association of a thickened nuchal translucency noted  in the first trimester with fetal aneuploidy, genetic syndromes that cannot be detected via prenatal ultrasounds, and fetal cardiac defects was discussed.  As her cell free DNA test indicated a low risk for trisomy 21, 18, and 13, her fetus' risk of having Down syndrome is probably low.  She understands that definitive diagnosis of fetal aneuploidy can only be made via a CVS in the first trimester or an amniocentesis starting at 15 to 16 weeks.  She will consider further testing should there be any abnormalities noted on her future ultrasound exams.  I have scheduled a first trimester ultrasound in our office sometime next week to reassess the nuchal translucency.   She should have a detailed fetal anatomy scan scheduled in our office.  We will refer her for a fetal echocardiogram later in her pregnancy due to the possible thickened nuchal translucency noted during the first trimester of her current pregnancy.  Weekly fetal testing should be started at around 32 weeks.  Possible renal cell carcinoma The patient was advised to have the MRI to better define the lesion in her kidney performed as soon as possible.  Should renal cell carcinoma continue to be suspected, she should be referred to a nephrologist/urologist as soon as possible for a possible biopsy for definitive diagnosis.  A discussion regarding the most optimal institution for delivery and her pregnancy management will be discussed should renal  cell carcinoma be confirmed.  At the end of the consultation, the patient stated that all her questions had been answered to her complete satisfaction.    Thank you for referring this patient for a Maternal-Fetal Medicine consultation.  A total of 65 minutes was spent counseling and coordinating the care for this patient.  Greater than 50% of the time was spent in direct face-to-face contact.

## 2019-11-21 ENCOUNTER — Other Ambulatory Visit: Payer: Self-pay | Admitting: *Deleted

## 2019-11-21 ENCOUNTER — Telehealth: Payer: Self-pay | Admitting: Interventional Cardiology

## 2019-11-21 DIAGNOSIS — O09521 Supervision of elderly multigravida, first trimester: Secondary | ICD-10-CM

## 2019-11-21 NOTE — Telephone Encounter (Signed)
Pt c/o medication issue:  1. Name of Medication: amLODipine-benazepril (LOTREL) 5-40 MG capsule  2. How are you currently taking this medication (dosage and times per day)? n/a  3. Are you having a reaction (difficulty breathing--STAT)? No   4. What is your medication issue? Patient is currently not taking this medication due to being pregnant. She states she saw her OB and they feel she needs to be on something to replace this. Please advise.

## 2019-11-21 NOTE — Telephone Encounter (Signed)
Nifedipine, labetalol and methyldopa are first line in pregnancy. Patient already on carvedilol. I would recommend starting Nifedipine 30mg  daily and follow up in 2 weeks.  Of note patient has a history of CHF and possible renal cell carcinoma.

## 2019-11-21 NOTE — Telephone Encounter (Signed)
Pt currently pregnant and off of her cardiac medications.  OB recommended that she contact our office about replacement medication for her Amlodipine/Olmesartan.  Per review of OB note, looks like they are also recommending echo.  Hasn't had one since 2018.   Will route to Dr. Tamala Julian and Pharmacy team for assistance on medication.

## 2019-11-22 ENCOUNTER — Other Ambulatory Visit: Payer: Self-pay | Admitting: *Deleted

## 2019-11-22 NOTE — Progress Notes (Signed)
[id number]  

## 2019-11-22 NOTE — Telephone Encounter (Signed)
Procardia XL 30 mg daily. Will probably need to switch from Carvedilol to Labetalol. Start procardia (nifedipine xl 30 mg daily) first. Further adjustments at the [place]. Does she have any blood pressures recorded?

## 2019-11-22 NOTE — Telephone Encounter (Signed)
Called pt back and moved appt to 6/28 with Dr. Tamala Julian

## 2019-11-22 NOTE — Telephone Encounter (Signed)
Spoke with pt and made her aware that I am waiting for final recommendations from Dr. Tamala Julian on medication to replace Amlodipine/Benazepril and whether or not to do an echo.  I did go ahead and move pt's appt up to 7/6.  Advised I will call back as soon as I hear from Dr. Tamala Julian.  Pt appreciative for call.   Pt did mention that her BP at OB on Monday was 130s/80s. She has not checked at home.  Encouraged her to monitor at least a few times a week.

## 2019-11-22 NOTE — Telephone Encounter (Signed)
Follow up    Pt would like to get alternative for her amlodipine. Also, she said her OB advised her to see her heart doctor as soon as possible. She is asking if she can get in sooner.

## 2019-11-23 ENCOUNTER — Ambulatory Visit: Payer: BC Managed Care – PPO | Attending: Obstetrics and Gynecology

## 2019-11-23 ENCOUNTER — Other Ambulatory Visit: Payer: Self-pay

## 2019-11-23 ENCOUNTER — Ambulatory Visit: Payer: BC Managed Care – PPO | Admitting: *Deleted

## 2019-11-23 ENCOUNTER — Other Ambulatory Visit: Payer: Self-pay | Admitting: *Deleted

## 2019-11-23 VITALS — BP 132/80 | HR 90

## 2019-11-23 DIAGNOSIS — E669 Obesity, unspecified: Secondary | ICD-10-CM

## 2019-11-23 DIAGNOSIS — Z363 Encounter for antenatal screening for malformations: Secondary | ICD-10-CM | POA: Diagnosis not present

## 2019-11-23 DIAGNOSIS — O09521 Supervision of elderly multigravida, first trimester: Secondary | ICD-10-CM | POA: Diagnosis not present

## 2019-11-23 DIAGNOSIS — Z3A12 12 weeks gestation of pregnancy: Secondary | ICD-10-CM

## 2019-11-23 DIAGNOSIS — O0990 Supervision of high risk pregnancy, unspecified, unspecified trimester: Secondary | ICD-10-CM | POA: Diagnosis not present

## 2019-11-23 DIAGNOSIS — O10011 Pre-existing essential hypertension complicating pregnancy, first trimester: Secondary | ICD-10-CM

## 2019-11-23 DIAGNOSIS — O2692 Pregnancy related conditions, unspecified, second trimester: Secondary | ICD-10-CM | POA: Diagnosis not present

## 2019-11-23 DIAGNOSIS — O10919 Unspecified pre-existing hypertension complicating pregnancy, unspecified trimester: Secondary | ICD-10-CM

## 2019-11-23 DIAGNOSIS — O99211 Obesity complicating pregnancy, first trimester: Secondary | ICD-10-CM

## 2019-11-24 DIAGNOSIS — O10011 Pre-existing essential hypertension complicating pregnancy, first trimester: Secondary | ICD-10-CM | POA: Diagnosis not present

## 2019-11-24 DIAGNOSIS — I509 Heart failure, unspecified: Secondary | ICD-10-CM | POA: Diagnosis not present

## 2019-11-24 DIAGNOSIS — Z3A12 12 weeks gestation of pregnancy: Secondary | ICD-10-CM | POA: Diagnosis not present

## 2019-11-24 DIAGNOSIS — Z3689 Encounter for other specified antenatal screening: Secondary | ICD-10-CM | POA: Diagnosis not present

## 2019-11-24 DIAGNOSIS — O99211 Obesity complicating pregnancy, first trimester: Secondary | ICD-10-CM | POA: Diagnosis not present

## 2019-11-24 DIAGNOSIS — O99411 Diseases of the circulatory system complicating pregnancy, first trimester: Secondary | ICD-10-CM | POA: Diagnosis not present

## 2019-11-24 DIAGNOSIS — Z1151 Encounter for screening for human papillomavirus (HPV): Secondary | ICD-10-CM | POA: Diagnosis not present

## 2019-11-24 DIAGNOSIS — Z124 Encounter for screening for malignant neoplasm of cervix: Secondary | ICD-10-CM | POA: Diagnosis not present

## 2019-11-24 DIAGNOSIS — O09521 Supervision of elderly multigravida, first trimester: Secondary | ICD-10-CM | POA: Diagnosis not present

## 2019-11-24 MED ORDER — NIFEDIPINE ER OSMOTIC RELEASE 30 MG PO TB24: 30.0000 mg | ORAL_TABLET | Freq: Every day | ORAL | 3 refills | Status: DC

## 2019-11-24 NOTE — Telephone Encounter (Signed)
Spoke with pt and went over recommendations per Dr. Tamala Julian.  Pt has been recording BPs.  States it has been doing ok but didn't have readings at time of call.  Advised pt to start medication today and monitor BP over the weekend and bring those readings to her appt Monday.  Pt appreciative for call.

## 2019-11-26 NOTE — Progress Notes (Signed)
Cardiology Office Note:    Date:  11/27/2019   ID:  Renee Roberson, DOB 04-Aug-1980, MRN [id number]  PCP:  Maury Dus, MD  Cardiologist:  [name], MD   Referring MD: Maury Dus, MD   Chief Complaint  Patient presents with  . Congestive Heart Failure    History of Present Illness:    Renee Roberson is a 39 y.o. female with a hx of HTN, PCOS,obesity,chest pain,and chronic combined systolic and diastolic HF. A prior ejection fraction was 25% in September 2014, and this improved to 40-45% by March 2015 and then normalized to EF of 60 to 65% per echo in 12/2016.She continues to have diastolic dysfunction.   She is now [redacted] weeks pregnant. She has never carried a pregnancy to term. She is not having shortness of breath but her obstetrical team is concerned given her history of systolic heart failure and considers her to be high risk. She has not had swelling, orthopnea, PND, palpitations, syncope, or chest pain.  Her medication regimen has been changed to prevent teratogenesis/fetal growth retardation. ACE/ARB therapy has been discontinued and Procardia XL 30 mg/day was started. She is on carvedilol 25 mg twice daily. No specific data on carvedilol. Labetalol as is the management therapy of choice in the setting of pregnancy. She is also on furosemide 40 mg/day.  Past Medical History:  Diagnosis Date  . Abnormal Pap smear of cervix   . Accelerated essential hypertension 03/20/2013   Left ventricular systolic dysfunction as a complication   . Amenorrhea   . Chest pain    Coronary CTA 04/2019: Calcium score 0; no evidence of CAD  . CHF (congestive heart failure) ([place])   . Chronic combined systolic and diastolic heart failure ([place]) 11/13/2013   LVEF improved from 25% to 45% after 6 months of antihypertensive therapy   . Daytime sleepiness 08/17/2016  . Depression   . Diverticulitis of large intestine 12/30/2015  . GERD (gastroesophageal reflux disease)   . Gout   .  Hepatic steatosis   . Hepatomegaly   . Hirsutism   . HTN (hypertension)   . Hypotension 08/20/2014  . Infertility, female   . Migraine   . Morbid obesity ([place]) 03/20/2013  . OSA on CPAP 02/21/2018   Home sleep study showed severe obstructive sleep apnea with an AHI of 62.3 on night 1 and 92.0 and night to with oxygen desaturations less than 70% for 1 minute.  A total of 1 hour and 22 minutes was noted with O2 sats less than 89%.  52% of the time was spent with oxygen saturations less than 90%. Now on CPAP at 14 cm H2O   . PCOS (polycystic ovarian syndrome)   . Renal mass   . Snoring 08/17/2016  . STD (sexually transmitted disease)    HSV (genital)  . Systolic heart failure [facility])     Past Surgical History:  Procedure Laterality Date  . COLPOSCOPY  2015  . no surgical hx      Current Medications: Current Meds  Medication Sig  . acetaminophen (TYLENOL) 500 MG tablet Take 500 mg by mouth every 6 (six) hours as needed for mild pain.  Marland Kitchen allopurinol (ZYLOPRIM) 100 MG tablet Take 100 mg by mouth daily.   Marland Kitchen aspirin-acetaminophen-caffeine (EXCEDRIN MIGRAINE) 250-250-65 MG tablet Take 2 tablets by mouth every 6 (six) hours as needed for migraine.   . Butalbital-APAP-Caffeine (FIORICET PO) Take by mouth as needed.   . COLCHICINE PO Take by mouth  as needed.   . furosemide (LASIX) 40 MG tablet Take 1 tablet (40 mg total) by mouth daily.  Marland Kitchen NIFEdipine (PROCARDIA-XL/NIFEDICAL-XL) 30 MG 24 hr tablet Take 1 tablet (30 mg total) by mouth daily.  Marland Kitchen omeprazole (PRILOSEC) 40 MG capsule Take 40 mg by mouth as needed (heart burn/gerd).  . Prenatal Vit-Fe Fumarate-FA (PRENATAL VITAMIN PO) Take by mouth.  . [DISCONTINUED] carvedilol (COREG) 25 MG tablet Take 1 tablet (25 mg total) by mouth 2 (two) times daily with a meal.     Allergies:   Fish allergy   Social History   Socioeconomic History  . Marital status: Roberson    Spouse name: Not on file  . Number of children: Not on file  . Years of  education: Not on file  . Highest education level: Not on file  Occupational History  . Not on file  Tobacco Use  . Smoking status: Never Smoker  . Smokeless tobacco: Never Used  Vaping Use  . Vaping Use: Never used  Substance and Sexual Activity  . Alcohol use: No    Alcohol/week: 0.0 standard drinks  . Drug use: No  . Sexual activity: Yes    Partners: Male  Other Topics Concern  . Not on file  Social History Narrative  . Not on file   Social Determinants of Health   Financial Resource Strain:   . Difficulty of Paying Living Expenses:   Food Insecurity:   . Worried About Charity fundraiser in the Last Year:   . Arboriculturist in the Last Year:   Transportation Needs:   . Film/video editor (Medical):   Marland Kitchen Lack of Transportation (Non-Medical):   Physical Activity:   . Days of Exercise per Week:   . Minutes of Exercise per Session:   Stress:   . Feeling of Stress :   Social Connections:   . Frequency of Communication with Friends and Family:   . Frequency of Social Gatherings with Friends and Family:   . Attends Religious Services:   . Active Member of Clubs or Organizations:   . Attends Archivist Meetings:   Marland Kitchen Marital Status:      Family History: The patient's family history includes Cancer in her maternal grandmother; Diabetes in her paternal grandfather; Hypertension in her father, mother, and sister; Thyroid disease in her father.  ROS:   Please see the history of present illness.    Anxious concerning both the possibility of losing the pregnancy but also potentially having a sudden cardiac catastrophe. She is concerned about the possibility of sudden death. 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 She is now on CPAP. She has been on good blood pressure control. All other systems reviewed and are negative.  EKGs/Labs/Other Studies Reviewed:    The following studies were  reviewed today: COR CTA 2020: IMPRESSION: 1. Coronary calcium score of 0. This was 0 percentile for age and sex matched control.  2. Normal coronary origin with right dominance.  3. No evidence of CAD. CAD-RADS 0. No evidence of CAD (0%). Consider non-atherosclerotic causes of chest pain.  ECHOCARDIOGRAM 2017: Study Conclusions   - Left ventricle: The cavity size was normal. There was mild  concentric hypertrophy. Systolic function was normal. The  estimated ejection fraction was in the range of 60% to 65%. Wall  motion was normal; there were no regional wall motion  abnormalities. Features are consistent with a  pseudonormal left  ventricular filling pattern, with concomitant abnormal relaxation  and increased filling pressure (grade 2 diastolic dysfunction).  - Left atrium: The atrium was mildly dilated.  - Pulmonary arteries: Systolic pressure could not be accurately  estimated.   EKG:  EKG normal sinus rhythm, nonspecific T wave flattening.  Recent Labs: 03/28/2019: NT-Pro BNP 36 05/30/2019: ALT 34; BUN 10; Creatinine, Ser 0.80; Hemoglobin 13.5; Platelets 314; Potassium 4.0; Sodium 136  Recent Lipid Panel No results found for: CHOL, TRIG, HDL, CHOLHDL, VLDL, LDLCALC, LDLDIRECT  Physical Exam:    VS:  BP 134/72   Pulse (!) 104   Ht 5\' 5"  (1.651 m)   Wt 239 lb 6.4 oz (108.6 kg) Comment: [redacted] weeks Pregnant  LMP 08/22/2019 (Approximate)   SpO2 98%   BMI 39.84 kg/m     Wt Readings from Last 3 Encounters:  11/27/19 239 lb 6.4 oz (108.6 kg)  11/16/19 241 lb (109.3 kg)  06/27/19 241 lb 4.8 oz (109.5 kg)     GEN: Morbid obesity. No acute distress HEENT: Normal NECK: No JVD. LYMPHATICS: No lymphadenopathy CARDIAC:  RRR without murmur, gallop, or edema. VASCULAR:  Normal Pulses. No bruits. RESPIRATORY:  Clear to auscultation without rales, wheezing or rhonchi  ABDOMEN: Soft, non-tender, non-distended, No pulsatile mass, MUSCULOSKELETAL: No deformity   SKIN: Warm and dry NEUROLOGIC:  Alert and oriented x 3 PSYCHIATRIC:  Normal affect   ASSESSMENT:    1. Chronic combined systolic and diastolic heart failure ([place])   2. 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 OSA on CPAP   4. Hypokalemia   5. [redacted] weeks gestation of pregnancy   6. Educated about COVID-19 virus infection    PLAN:    In order of problems listed above:  1. The patient exhibits no evidence of volume overload or decompensated heart failure. The last echocardiogram of record revealed normal systolic function. We will perform a baseline 2D Doppler echocardiogram within the next week. We will continue Procardia, Lasix, and switch from carvedilol to labetalol 200 mg p.o. twice daily. She will call if any symptoms or tachycardia with the swap. Depending upon LV function we will be able to make a better assessment of pregnancy risk. 2. Blood pressure is under good control. We discussed low-salt diet and volume control relative to fluid intake. 3. She is compliant with CPAP. 4. No recent laboratory data. 5. So far, pregnancy is doing well. 6. She has been vaccinated.  Switch carvedilol 25 mg twice daily to labetalol 200 mg twice daily. Monitor blood pressure at home. 2D Doppler echocardiogram and brain natruretic peptide will be done prior to the next visit which I would like to be within the next 2 to 3 weeks. He should monitor blood pressure closely at home and call if any problems.   Medication Adjustments/Labs and Tests Ordered: Current medicines are reviewed at length with the patient today.  Concerns regarding medicines are outlined above.  Orders Placed This Encounter  Procedures  . ECHOCARDIOGRAM COMPLETE   Meds ordered this encounter  Medications  . labetalol (NORMODYNE) 200 MG tablet    Sig: Take 1 tablet (200 mg total) by mouth 2 (two) times daily.    Dispense:  180 tablet    Refill:  3    D/c carvedilol    Patient  Instructions  Medication Instructions:  1) DISCONTINUE Carvedilol 2) START Labetalol 200mg  twice daily  *If you need a refill on your cardiac medications before your  next appointment, please call your pharmacy*   Lab Work: None If you have labs (blood work) drawn today and your tests are completely normal, you will receive your results only by: Marland Kitchen MyChart Message (if you have MyChart) OR . A paper copy in the mail If you have any lab test that is abnormal or we need to change your treatment, we will call you to review the results.   Testing/Procedures: Your physician has requested that you have an echocardiogram ASAP. Echocardiography is a painless test that uses sound waves to create images of your heart. It provides your doctor with information about the size and shape of your heart and how well your heart's chambers and valves are working. This procedure takes approximately one hour. There are no restrictions for this procedure.     Follow-Up: At [facility], you and your health needs are our priority.  As part of our continuing mission to provide you with exceptional heart care, we have created designated Provider Care Teams.  These Care Teams include your primary Cardiologist (physician) and Advanced Practice Providers (APPs -  Physician Assistants and Nurse Practitioners) who all work together to provide you with the care you need, when you need it.  We recommend signing up for the patient portal called "MyChart".  Sign up information is provided on this After Visit Summary.  MyChart is used to connect with patients for Virtual Visits (Telemedicine).  Patients are able to view lab/test results, encounter notes, upcoming appointments, etc.  Non-urgent messages can be sent to your provider as well.   To learn more about what you can do with MyChart, go to [email or web address].    Your next appointment:   2-3 week(s)  The format for your next appointment:   In  Person  Provider:   You may see [name], MD or one of the following Advanced Practice Providers on your designated Care Team:    Truitt Merle, NP  Cecilie Kicks, NP  Kathyrn Drown, NP    Other Instructions      Signed, [name], MD  11/27/2019 4:35 PM    [place]

## 2019-11-27 ENCOUNTER — Other Ambulatory Visit: Payer: Self-pay

## 2019-11-27 ENCOUNTER — Ambulatory Visit (INDEPENDENT_AMBULATORY_CARE_PROVIDER_SITE_OTHER): Payer: BC Managed Care – PPO | Admitting: Interventional Cardiology

## 2019-11-27 ENCOUNTER — Encounter: Payer: Self-pay | Admitting: Interventional Cardiology

## 2019-11-27 ENCOUNTER — Encounter: Payer: Self-pay | Admitting: *Deleted

## 2019-11-27 VITALS — BP 134/72 | HR 104 | Ht 65.0 in | Wt 239.4 lb

## 2019-11-27 DIAGNOSIS — D49511 Neoplasm of unspecified behavior of right kidney: Secondary | ICD-10-CM | POA: Diagnosis not present

## 2019-11-27 DIAGNOSIS — E876 Hypokalemia: Secondary | ICD-10-CM | POA: Diagnosis not present

## 2019-11-27 DIAGNOSIS — I110 Hypertensive heart disease with heart failure: Secondary | ICD-10-CM | POA: Diagnosis not present

## 2019-11-27 DIAGNOSIS — I5022 Chronic systolic (congestive) heart failure: Secondary | ICD-10-CM

## 2019-11-27 DIAGNOSIS — I5042 Chronic combined systolic (congestive) and diastolic (congestive) heart failure: Secondary | ICD-10-CM

## 2019-11-27 DIAGNOSIS — Z9989 Dependence on other enabling machines and devices: Secondary | ICD-10-CM

## 2019-11-27 DIAGNOSIS — G4733 Obstructive sleep apnea (adult) (pediatric): Secondary | ICD-10-CM | POA: Diagnosis not present

## 2019-11-27 DIAGNOSIS — Z7189 Other specified counseling: Secondary | ICD-10-CM

## 2019-11-27 DIAGNOSIS — Z3A13 13 weeks gestation of pregnancy: Secondary | ICD-10-CM

## 2019-11-27 DIAGNOSIS — R8271 Bacteriuria: Secondary | ICD-10-CM | POA: Diagnosis not present

## 2019-11-27 MED ORDER — LABETALOL HCL 200 MG PO TABS: 200.0000 mg | ORAL_TABLET | Freq: Two times a day (BID) | ORAL | 3 refills | Status: DC

## 2019-11-27 NOTE — Patient Instructions (Signed)
Medication Instructions:  1) DISCONTINUE Carvedilol 2) START Labetalol 200mg  twice daily  *If you need a refill on your cardiac medications before your next appointment, please call your pharmacy*   Lab Work: None If you have labs (blood work) drawn today and your tests are completely normal, you will receive your results only by: Marland Kitchen MyChart Message (if you have MyChart) OR . A paper copy in the mail If you have any lab test that is abnormal or we need to change your treatment, we will call you to review the results.   Testing/Procedures: Your physician has requested that you have an echocardiogram ASAP. Echocardiography is a painless test that uses sound waves to create images of your heart. It provides your doctor with information about the size and shape of your heart and how well your heart's chambers and valves are working. This procedure takes approximately one hour. There are no restrictions for this procedure.     Follow-Up: At [facility], you and your health needs are our priority.  As part of our continuing mission to provide you with exceptional heart care, we have created designated Provider Care Teams.  These Care Teams include your primary Cardiologist (physician) and Advanced Practice Providers (APPs -  Physician Assistants and Nurse Practitioners) who all work together to provide you with the care you need, when you need it.  We recommend signing up for the patient portal called "MyChart".  Sign up information is provided on this After Visit Summary.  MyChart is used to connect with patients for Virtual Visits (Telemedicine).  Patients are able to view lab/test results, encounter notes, upcoming appointments, etc.  Non-urgent messages can be sent to your provider as well.   To learn more about what you can do with MyChart, go to [email or web address].    Your next appointment:   2-3 week(s)  The format for your next appointment:   In Person  Provider:   You may  see [name], MD or one of the following Advanced Practice Providers on your designated Care Team:    Truitt Merle, NP  Cecilie Kicks, NP  Kathyrn Drown, NP    Other Instructions

## 2019-11-28 ENCOUNTER — Ambulatory Visit (HOSPITAL_COMMUNITY): Payer: BC Managed Care – PPO

## 2019-11-28 NOTE — Addendum Note (Signed)
Addended by: Carylon Perches on: 11/28/2019 02:29 PM   Modules accepted: Orders

## 2019-12-05 ENCOUNTER — Ambulatory Visit: Payer: BC Managed Care – PPO | Admitting: Physician Assistant

## 2019-12-08 ENCOUNTER — Other Ambulatory Visit: Payer: BC Managed Care – PPO | Admitting: *Deleted

## 2019-12-08 ENCOUNTER — Other Ambulatory Visit: Payer: Self-pay

## 2019-12-08 ENCOUNTER — Ambulatory Visit (HOSPITAL_COMMUNITY): Payer: BC Managed Care – PPO | Attending: Internal Medicine

## 2019-12-08 DIAGNOSIS — I5042 Chronic combined systolic (congestive) and diastolic (congestive) heart failure: Secondary | ICD-10-CM

## 2019-12-08 NOTE — Progress Notes (Signed)
Patient ID: Renee Roberson, female   DOB: 09-06-1980, 39 y.o.   MRN: [id number]    Unable to administer Definity due to pregnancy.

## 2019-12-09 LAB — PRO B NATRIURETIC PEPTIDE: NT-Pro BNP: 8 pg/mL (ref 0–130)

## 2019-12-12 DIAGNOSIS — O09519 Supervision of elderly primigravida, unspecified trimester: Secondary | ICD-10-CM

## 2019-12-12 DIAGNOSIS — I10 Essential (primary) hypertension: Secondary | ICD-10-CM | POA: Diagnosis not present

## 2019-12-12 DIAGNOSIS — M109 Gout, unspecified: Secondary | ICD-10-CM | POA: Diagnosis not present

## 2019-12-12 DIAGNOSIS — I509 Heart failure, unspecified: Secondary | ICD-10-CM

## 2019-12-12 DIAGNOSIS — N2889 Other specified disorders of kidney and ureter: Secondary | ICD-10-CM | POA: Diagnosis not present

## 2019-12-12 DIAGNOSIS — O09529 Supervision of elderly multigravida, unspecified trimester: Secondary | ICD-10-CM | POA: Diagnosis not present

## 2019-12-12 DIAGNOSIS — E669 Obesity, unspecified: Secondary | ICD-10-CM | POA: Diagnosis not present

## 2019-12-12 HISTORY — DX: Supervision of elderly primigravida, unspecified trimester: O09.519

## 2019-12-12 HISTORY — DX: Heart failure, unspecified: I50.9

## 2019-12-13 ENCOUNTER — Ambulatory Visit: Payer: BC Managed Care – PPO | Admitting: Nurse Practitioner

## 2019-12-13 ENCOUNTER — Encounter: Payer: Self-pay | Admitting: Interventional Cardiology

## 2019-12-13 ENCOUNTER — Telehealth: Payer: Self-pay | Admitting: Interventional Cardiology

## 2019-12-13 DIAGNOSIS — O99412 Diseases of the circulatory system complicating pregnancy, second trimester: Secondary | ICD-10-CM | POA: Diagnosis not present

## 2019-12-13 DIAGNOSIS — I509 Heart failure, unspecified: Secondary | ICD-10-CM | POA: Diagnosis not present

## 2019-12-13 DIAGNOSIS — O09522 Supervision of elderly multigravida, second trimester: Secondary | ICD-10-CM | POA: Diagnosis not present

## 2019-12-13 DIAGNOSIS — Z3A15 15 weeks gestation of pregnancy: Secondary | ICD-10-CM | POA: Diagnosis not present

## 2019-12-13 DIAGNOSIS — O10012 Pre-existing essential hypertension complicating pregnancy, second trimester: Secondary | ICD-10-CM | POA: Diagnosis not present

## 2019-12-13 DIAGNOSIS — O99212 Obesity complicating pregnancy, second trimester: Secondary | ICD-10-CM | POA: Diagnosis not present

## 2019-12-13 NOTE — Telephone Encounter (Signed)
Pt c/o medication issue:  1. Name of Medication: NIFEdipine (PROCARDIA-XL/NIFEDICAL-XL) 30 MG 24 hr tablet  2. How are you currently taking this medication (dosage and times per day)? One 30 MG tablet by mouth daily   3. Are you having a reaction (difficulty breathing--STAT)? Yes   4. What is your medication issue? Renee Roberson is calling stating this medications is causing her to have migraines everyday as well as dizziness. Please advise.

## 2019-12-13 NOTE — Telephone Encounter (Signed)
Left message to call back  

## 2019-12-13 NOTE — Telephone Encounter (Signed)
This encounter was created in error - please disregard.

## 2019-12-13 NOTE — Telephone Encounter (Signed)
Pt seen her new fetal medical specialist yesterday and mentioned the migraines to her.  She advised that it could be coming from the Nifedipine and to contact our office.  BP at that appt yesterday was 119/71.  Pt states readings at home are usually a little higher.  On previous message from Pharmacist, Methyldopa is the only thing pt is not currently on that they listed as pregnancy safe.  Will route to Dr. Tamala Julian for review and advisement.  I did mention possibly referring to Dr. Harriet Masson since she is starting to work with pregnant pts.  Pt agreeable if Dr. Tamala Julian would like for her to do this.

## 2019-12-13 NOTE — Telephone Encounter (Signed)
Patient returning Renee Roberson phone call.

## 2019-12-14 ENCOUNTER — Ambulatory Visit (HOSPITAL_COMMUNITY)
Admission: RE | Admit: 2019-12-14 | Discharge: 2019-12-14 | Disposition: A | Discharge status: Home / Self Care | Payer: BC Managed Care – PPO | Source: Ambulatory Visit | Attending: Family Medicine | Admitting: Family Medicine

## 2019-12-14 ENCOUNTER — Other Ambulatory Visit: Payer: Self-pay

## 2019-12-14 ENCOUNTER — Other Ambulatory Visit: Payer: Self-pay | Admitting: Family Medicine

## 2019-12-14 DIAGNOSIS — K8020 Calculus of gallbladder without cholecystitis without obstruction: Secondary | ICD-10-CM | POA: Diagnosis not present

## 2019-12-14 DIAGNOSIS — N2889 Other specified disorders of kidney and ureter: Secondary | ICD-10-CM | POA: Diagnosis not present

## 2019-12-14 DIAGNOSIS — R160 Hepatomegaly, not elsewhere classified: Secondary | ICD-10-CM | POA: Diagnosis not present

## 2019-12-17 NOTE — Telephone Encounter (Signed)
Stop Nifedipine and start hydralazine 25 mh BID. Will have to titrate and will eventually be on this med 3-4 times daily depending on BP.  I would love for Dr. Harriet Masson to be involved if patient can get to office appointments in [facility] office. This is a specialty for her and I think it would be beneficial for [name].

## 2019-12-17 NOTE — Progress Notes (Signed)
Cardiology Office Note:    Date:  12/18/2019   ID:  Renee Roberson, DOB [birth year], MRN [id number]  PCP:  Donald Prose, MD  Cardiologist:  [name], MD   Referring MD: Maury Dus, MD   Chief Complaint  Patient presents with  . Congestive Heart Failure  . Hypertension  . Hyperlipidemia    History of Present Illness:    Renee Roberson is a 39 y.o. female with a hx of HTN, PCOS,obesity,chest pain,and chronic combined systolic and diastolic HF.A priorejection fraction was 25% in September 2014 and improved to near normal with current LVEF 60 to 65% with mild LVH 11/2019. She continues to have diastolic dysfunction.  Around 4 July she developed orthopnea after the dietary indiscretion relative to intake of salty foods and volume during the holiday weekend.  She called with concerns of shortness of breath with lying.  No specific instructions were given.  This cleared up within 24 hours.  It has not recurred.  We had a conversation concerning diastolic dysfunction and volume overload especially in her current pregnancy state with expanded intravascular volume.  Her medication regimen has changed.  She discontinue Procardia XL 30 mg/day because it exacerbated headaches.  She continues on labetalol 200 mg twice daily.  Blood pressure as noted below was upper normal for her age and current pregnancy state.  She is taking aspirin 81 mg/day.  An antidepressant has been started (sertraline).  She does state that since discontinuing Procardia, headaches have markedly improved.    Past Medical History:  Diagnosis Date  . Abnormal Pap smear of cervix   . Accelerated essential hypertension 03/20/2013   Left ventricular systolic dysfunction as a complication   . Amenorrhea   . Chest pain    Coronary CTA 04/2019: Calcium score 0; no evidence of CAD  . CHF (congestive heart failure) ([place])   . Chronic combined systolic and diastolic heart failure ([place]) 11/13/2013   LVEF  improved from 25% to 45% after 6 months of antihypertensive therapy   . Daytime sleepiness 08/17/2016  . Depression   . Diverticulitis of large intestine 12/30/2015  . GERD (gastroesophageal reflux disease)   . Gout   . Hepatic steatosis   . Hepatomegaly   . Hirsutism   . HTN (hypertension)   . Hypotension 08/20/2014  . Infertility, female   . Migraine   . Morbid obesity ([place]) 03/20/2013  . OSA on CPAP 02/21/2018   Home sleep study showed severe obstructive sleep apnea with an AHI of 62.3 on night 1 and 92.0 and night to with oxygen desaturations less than 70% for 1 minute.  A total of 1 hour and 22 minutes was noted with O2 sats less than 89%.  52% of the time was spent with oxygen saturations less than 90%. Now on CPAP at 14 cm H2O   . PCOS (polycystic ovarian syndrome)   . Renal mass   . Snoring 08/17/2016  . STD (sexually transmitted disease)    HSV (genital)  . Systolic heart failure [facility])     Past Surgical History:  Procedure Laterality Date  . COLPOSCOPY  2015  . no surgical hx      Current Medications: Current Meds  Medication Sig  . acetaminophen (TYLENOL) 500 MG tablet Take 500 mg by mouth every 6 (six) hours as needed for mild pain.  Marland Kitchen allopurinol (ZYLOPRIM) 100 MG tablet Take 100 mg by mouth daily.   Marland Kitchen aspirin EC 81 MG tablet Take 81 mg  by mouth daily. Swallow whole.  . Butalbital-APAP-Caffeine (FIORICET PO) Take by mouth as needed.   . COLCHICINE PO Take by mouth as needed.   . furosemide (LASIX) 40 MG tablet Take 1 tablet (40 mg total) by mouth daily.  Marland Kitchen labetalol (NORMODYNE) 200 MG tablet Take 1.5 tablets (300 mg total) by mouth 2 (two) times daily.  Marland Kitchen omeprazole (PRILOSEC) 40 MG capsule Take 40 mg by mouth as needed (heart burn/gerd).  . Prenatal Vit-Fe Fumarate-FA (PRENATAL VITAMIN PO) Take by mouth.  . sertraline (ZOLOFT) 50 MG tablet Take 50 mg by mouth daily.  . [DISCONTINUED] labetalol (NORMODYNE) 200 MG tablet Take 1 tablet (200 mg total) by mouth 2 (two)  times daily.     Allergies:   Fish allergy   Social History   Socioeconomic History  . Marital status: Single    Spouse name: Not on file  . Number of children: Not on file  . Years of education: Not on file  . Highest education level: Not on file  Occupational History  . Not on file  Tobacco Use  . Smoking status: Never Smoker  . Smokeless tobacco: Never Used  Vaping Use  . Vaping Use: Never used  Substance and Sexual Activity  . Alcohol use: No    Alcohol/week: 0.0 standard drinks  . Drug use: No  . Sexual activity: Yes    Partners: Male  Other Topics Concern  . Not on file  Social History Narrative  . Not on file   Social Determinants of Health   Financial Resource Strain:   . Difficulty of Paying Living Expenses:   Food Insecurity:   . Worried About Charity fundraiser in the Last Year:   . Arboriculturist in the Last Year:   Transportation Needs:   . Film/video editor (Medical):   Marland Kitchen Lack of Transportation (Non-Medical):   Physical Activity:   . Days of Exercise per Week:   . Minutes of Exercise per Session:   Stress:   . Feeling of Stress :   Social Connections:   . Frequency of Communication with Friends and Family:   . Frequency of Social Gatherings with Friends and Family:   . Attends Religious Services:   . Active Member of Clubs or Organizations:   . Attends Archivist Meetings:   Marland Kitchen Marital Status:      Family History: The patient's family history includes Cancer in her maternal grandmother; Diabetes in her paternal grandfather; Hypertension in her father, mother, and sister; Thyroid disease in her father.  ROS:   Please see the history of present illness.    Anxiety concerning the pregnancy with reference to both her health and that of the fetus.  She was encouraged by the finding of normal LV function noted on echo as noted below.  Since starting sertraline she is noticing some palpitation.  All other systems reviewed and are  negative.  EKGs/Labs/Other Studies Reviewed:    The following studies were reviewed today:  2D Doppler echocardiogram 12/11/2019: IMPRESSIONS  1. Left ventricular ejection fraction, by estimation, is 60 to 65%. The  left ventricle has normal function. The left ventricle has no regional  wall motion abnormalities. There is moderate left ventricular hypertrophy.  Left ventricular diastolic  parameters are consistent with Grade I diastolic dysfunction (impaired  relaxation).  2. Right ventricular systolic function is normal. The right ventricular  size is normal. Tricuspid regurgitation signal is inadequate for assessing  PA pressure.  3. The mitral valve is abnormal. Trivial mitral valve regurgitation.  4. The aortic valve is tricuspid. Aortic valve regurgitation is not  visualized.  5. The inferior vena cava is normal in size with <50% respiratory  variability, suggesting right atrial pressure of 8 mmHg.   Comparison(s): Prior images unable to be directly viewed, comparison made  by report only. 01/05/17: LVEF 60-65%, grade 2 DD.   EKG:  EKG not repeated performed on 11/27/2019 is unremarkable.  Recent Labs: 05/30/2019: ALT 34; BUN 10; Creatinine, Ser 0.80; Hemoglobin 13.5; Platelets 314; Potassium 4.0; Sodium 136 12/08/2019: NT-Pro BNP 8  Recent Lipid Panel No results found for: CHOL, TRIG, HDL, CHOLHDL, VLDL, LDLCALC, LDLDIRECT  Physical Exam:    VS:  BP 124/78   Pulse (!) 106   Ht 5\' 5"  (1.651 m)   Wt 239 lb 9.6 oz (108.7 kg)   LMP 08/22/2019 (Approximate)   SpO2 98%   BMI 39.87 kg/m     Wt Readings from Last 3 Encounters:  12/18/19 239 lb 9.6 oz (108.7 kg)  11/27/19 239 lb 6.4 oz (108.6 kg)  11/16/19 241 lb (109.3 kg)     GEN: Severe obesity. No acute distress HEENT: Normal NECK: No JVD. LYMPHATICS: No lymphadenopathy CARDIAC:  RRR without murmur, gallop, or edema. VASCULAR:  Normal Pulses. No bruits. RESPIRATORY:  Clear to auscultation without rales,  wheezing or rhonchi  ABDOMEN: Soft, non-tender, non-distended, No pulsatile mass, MUSCULOSKELETAL: No deformity  SKIN: Warm and dry NEUROLOGIC:  Alert and oriented x 3 PSYCHIATRIC:  Normal affect   ASSESSMENT:    1. Chronic combined systolic and diastolic heart failure ([place])   2. Hypertensive heart disease with chronic systolic congestive heart failure ([place])   3. OSA on CPAP   4. Hypokalemia   5. [redacted] weeks gestation of pregnancy   6. Morbid obesity ([place])   7. Educated about COVID-19 virus infection    PLAN:    In order of problems listed above:  1. She did have an episode of what sounds like volume overload after 4 July when eating indiscriminately with much sodium and fluid intake.  It cleared within 24 hours.  She called the office but did not get any instructions about what to do.   2. Currently under good control on furosemide 40 mg/day, Procardia has been discontinued due to migraines, and she is on labetalol 200 mg twice daily.  Heart rate today with just walking into the office is 106 bpm.  We will plan to increase labetalol to 300 mg twice daily.  Hydralazine will be an option if blood pressure becomes an issue in later stages of pregnancy. 3. CPAP compliance is encouraged 4. No recent data 5. 14-week gestation.  She will be having a boy.  Overall doing well. 6. She has been vaccinated.  She will see Dr. Harriet Masson on her next office visit in August (18th).  I encouraged her to notify us if there is anything we can help with and I will look forward to seeing her again on the other side of her pregnancy.   Medication Adjustments/Labs and Tests Ordered: Current medicines are reviewed at length with the patient today.  Concerns regarding medicines are outlined above.  No orders of the defined types were placed in this encounter.  Meds ordered this encounter  Medications  . labetalol (NORMODYNE) 200 MG tablet    Sig: Take 1.5 tablets (300 mg total) by mouth 2 (two) times daily.     Dispense:  270 tablet  Refill:  3    Dose change    Patient Instructions  Medication Instructions:  1) DISCONTINUE Nifedipine 2) INCREASE Labetalol to 300mg  twice daily.   *If you need a refill on your cardiac medications before your next appointment, please call your pharmacy*   Lab Work: None If you have labs (blood work) drawn today and your tests are completely normal, you will receive your results only by: Marland Kitchen MyChart Message (if you have MyChart) OR . A paper copy in the mail If you have any lab test that is abnormal or we need to change your treatment, we will call you to review the results.   Testing/Procedures: None   Follow-Up: At [facility], you and your health needs are our priority.  As part of our continuing mission to provide you with exceptional heart care, we have created designated Provider Care Teams.  These Care Teams include your primary Cardiologist (physician) and Advanced Practice Providers (APPs -  Physician Assistants and Nurse Practitioners) who all work together to provide you with the care you need, when you need it.  We recommend signing up for the patient portal called "MyChart".  Sign up information is provided on this After Visit Summary.  MyChart is used to connect with patients for Virtual Visits (Telemedicine).  Patients are able to view lab/test results, encounter notes, upcoming appointments, etc.  Non-urgent messages can be sent to your provider as well.   To learn more about what you can do with MyChart, go to [email or web address].    Your next appointment:   1 month(s)  The format for your next appointment:   In Person  Provider:   You may see Dr. Harriet Masson or the following Advanced Practice Provider on your designated Care Team:    Laurann Montana, FNP    Other Instructions      Signed, [name], MD  12/18/2019 12:07 PM    [place]

## 2019-12-18 ENCOUNTER — Other Ambulatory Visit: Payer: Self-pay

## 2019-12-18 ENCOUNTER — Encounter: Payer: Self-pay | Admitting: Interventional Cardiology

## 2019-12-18 ENCOUNTER — Ambulatory Visit (INDEPENDENT_AMBULATORY_CARE_PROVIDER_SITE_OTHER): Payer: BC Managed Care – PPO | Admitting: Interventional Cardiology

## 2019-12-18 VITALS — BP 124/78 | HR 106 | Ht 65.0 in | Wt 239.6 lb

## 2019-12-18 DIAGNOSIS — E785 Hyperlipidemia, unspecified: Secondary | ICD-10-CM

## 2019-12-18 DIAGNOSIS — G4733 Obstructive sleep apnea (adult) (pediatric): Secondary | ICD-10-CM | POA: Diagnosis not present

## 2019-12-18 DIAGNOSIS — I110 Hypertensive heart disease with heart failure: Secondary | ICD-10-CM

## 2019-12-18 DIAGNOSIS — Z3A13 13 weeks gestation of pregnancy: Secondary | ICD-10-CM

## 2019-12-18 DIAGNOSIS — I5022 Chronic systolic (congestive) heart failure: Secondary | ICD-10-CM

## 2019-12-18 DIAGNOSIS — E876 Hypokalemia: Secondary | ICD-10-CM | POA: Diagnosis not present

## 2019-12-18 DIAGNOSIS — Z9989 Dependence on other enabling machines and devices: Secondary | ICD-10-CM

## 2019-12-18 DIAGNOSIS — I5042 Chronic combined systolic (congestive) and diastolic (congestive) heart failure: Secondary | ICD-10-CM | POA: Diagnosis not present

## 2019-12-18 DIAGNOSIS — Z7189 Other specified counseling: Secondary | ICD-10-CM

## 2019-12-18 MED ORDER — LABETALOL HCL 200 MG PO TABS: 300.0000 mg | ORAL_TABLET | Freq: Two times a day (BID) | ORAL | 3 refills | Status: DC

## 2019-12-18 NOTE — Patient Instructions (Signed)
Medication Instructions:  1) DISCONTINUE Nifedipine 2) INCREASE Labetalol to 300mg  twice daily.   *If you need a refill on your cardiac medications before your next appointment, please call your pharmacy*   Lab Work: None If you have labs (blood work) drawn today and your tests are completely normal, you will receive your results only by: Marland Kitchen MyChart Message (if you have MyChart) OR . A paper copy in the mail If you have any lab test that is abnormal or we need to change your treatment, we will call you to review the results.   Testing/Procedures: None   Follow-Up: At [facility], you and your health needs are our priority.  As part of our continuing mission to provide you with exceptional heart care, we have created designated Provider Care Teams.  These Care Teams include your primary Cardiologist (physician) and Advanced Practice Providers (APPs -  Physician Assistants and Nurse Practitioners) who all work together to provide you with the care you need, when you need it.  We recommend signing up for the patient portal called "MyChart".  Sign up information is provided on this After Visit Summary.  MyChart is used to connect with patients for Virtual Visits (Telemedicine).  Patients are able to view lab/test results, encounter notes, upcoming appointments, etc.  Non-urgent messages can be sent to your provider as well.   To learn more about what you can do with MyChart, go to [email or web address].    Your next appointment:   1 month(s)  The format for your next appointment:   In Person  Provider:   You may see Dr. Harriet Masson or the following Advanced Practice Provider on your designated Care Team:    Laurann Montana, FNP    Other Instructions

## 2019-12-18 NOTE — Telephone Encounter (Signed)
Pt in to see Dr. Tamala Julian today.  Scheduled pt to see Dr. Harriet Masson on 8/18.  We increased Labetalol to 300mg  BID but did not start Hydralazine since BP was good today and pt has been off Nifedipine.

## 2019-12-18 NOTE — Telephone Encounter (Signed)
Hi Renee Roberson,since Renee Roberson is seeing Dr. Tamala Julian today, I can see her on August 18 in [place]. Unless if her need her seen sooner then we can do this Friday. Thanks

## 2019-12-19 DIAGNOSIS — I509 Heart failure, unspecified: Secondary | ICD-10-CM | POA: Diagnosis not present

## 2019-12-19 DIAGNOSIS — O99810 Abnormal glucose complicating pregnancy: Secondary | ICD-10-CM | POA: Diagnosis not present

## 2019-12-19 DIAGNOSIS — N2889 Other specified disorders of kidney and ureter: Secondary | ICD-10-CM | POA: Diagnosis not present

## 2019-12-19 DIAGNOSIS — M109 Gout, unspecified: Secondary | ICD-10-CM | POA: Diagnosis not present

## 2019-12-22 DIAGNOSIS — O24410 Gestational diabetes mellitus in pregnancy, diet controlled: Secondary | ICD-10-CM | POA: Diagnosis not present

## 2019-12-29 ENCOUNTER — Telehealth: Payer: Self-pay | Admitting: Interventional Cardiology

## 2019-12-29 NOTE — Telephone Encounter (Signed)
FMLA/disability form received from [employer]. Placed in box for Dr. Tamala Julian to review. 12/29/19 vlm

## 2020-01-01 DIAGNOSIS — D49511 Neoplasm of unspecified behavior of right kidney: Secondary | ICD-10-CM | POA: Diagnosis not present

## 2020-01-04 DIAGNOSIS — K5792 Diverticulitis of intestine, part unspecified, without perforation or abscess without bleeding: Secondary | ICD-10-CM | POA: Diagnosis not present

## 2020-01-04 DIAGNOSIS — O24410 Gestational diabetes mellitus in pregnancy, diet controlled: Secondary | ICD-10-CM | POA: Diagnosis not present

## 2020-01-04 DIAGNOSIS — R1012 Left upper quadrant pain: Secondary | ICD-10-CM | POA: Diagnosis not present

## 2020-01-08 ENCOUNTER — Ambulatory Visit: Payer: BC Managed Care – PPO

## 2020-01-08 DIAGNOSIS — O99412 Diseases of the circulatory system complicating pregnancy, second trimester: Secondary | ICD-10-CM | POA: Diagnosis not present

## 2020-01-08 DIAGNOSIS — I509 Heart failure, unspecified: Secondary | ICD-10-CM | POA: Diagnosis not present

## 2020-01-08 DIAGNOSIS — I2510 Atherosclerotic heart disease of native coronary artery without angina pectoris: Secondary | ICD-10-CM | POA: Diagnosis not present

## 2020-01-08 DIAGNOSIS — Z3A19 19 weeks gestation of pregnancy: Secondary | ICD-10-CM | POA: Diagnosis not present

## 2020-01-09 DIAGNOSIS — O99412 Diseases of the circulatory system complicating pregnancy, second trimester: Secondary | ICD-10-CM | POA: Diagnosis not present

## 2020-01-09 DIAGNOSIS — O24419 Gestational diabetes mellitus in pregnancy, unspecified control: Secondary | ICD-10-CM | POA: Diagnosis not present

## 2020-01-09 DIAGNOSIS — I110 Hypertensive heart disease with heart failure: Secondary | ICD-10-CM | POA: Diagnosis not present

## 2020-01-09 DIAGNOSIS — I509 Heart failure, unspecified: Secondary | ICD-10-CM | POA: Diagnosis not present

## 2020-01-11 DIAGNOSIS — I509 Heart failure, unspecified: Secondary | ICD-10-CM | POA: Diagnosis not present

## 2020-01-11 DIAGNOSIS — O09512 Supervision of elderly primigravida, second trimester: Secondary | ICD-10-CM | POA: Diagnosis not present

## 2020-01-11 DIAGNOSIS — O24410 Gestational diabetes mellitus in pregnancy, diet controlled: Secondary | ICD-10-CM | POA: Diagnosis not present

## 2020-01-11 DIAGNOSIS — N2889 Other specified disorders of kidney and ureter: Secondary | ICD-10-CM | POA: Diagnosis not present

## 2020-01-11 NOTE — Telephone Encounter (Signed)
Signed form returned to [employer]. 01/11/20 vlm

## 2020-01-15 DIAGNOSIS — O24419 Gestational diabetes mellitus in pregnancy, unspecified control: Secondary | ICD-10-CM

## 2020-01-15 HISTORY — DX: Gestational diabetes mellitus in pregnancy, unspecified control: O24.419

## 2020-01-16 ENCOUNTER — Encounter: Payer: Self-pay | Admitting: *Deleted

## 2020-01-16 DIAGNOSIS — A64 Unspecified sexually transmitted disease: Secondary | ICD-10-CM | POA: Insufficient documentation

## 2020-01-16 DIAGNOSIS — F329 Major depressive disorder, single episode, unspecified: Secondary | ICD-10-CM | POA: Insufficient documentation

## 2020-01-16 DIAGNOSIS — R87619 Unspecified abnormal cytological findings in specimens from cervix uteri: Secondary | ICD-10-CM | POA: Insufficient documentation

## 2020-01-16 DIAGNOSIS — F32A Depression, unspecified: Secondary | ICD-10-CM | POA: Insufficient documentation

## 2020-01-16 DIAGNOSIS — N912 Amenorrhea, unspecified: Secondary | ICD-10-CM | POA: Insufficient documentation

## 2020-01-16 DIAGNOSIS — G43909 Migraine, unspecified, not intractable, without status migrainosus: Secondary | ICD-10-CM | POA: Insufficient documentation

## 2020-01-16 DIAGNOSIS — R079 Chest pain, unspecified: Secondary | ICD-10-CM | POA: Insufficient documentation

## 2020-01-16 DIAGNOSIS — L680 Hirsutism: Secondary | ICD-10-CM | POA: Insufficient documentation

## 2020-01-16 DIAGNOSIS — I5020 Unspecified systolic (congestive) heart failure: Secondary | ICD-10-CM | POA: Insufficient documentation

## 2020-01-16 DIAGNOSIS — K760 Fatty (change of) liver, not elsewhere classified: Secondary | ICD-10-CM | POA: Insufficient documentation

## 2020-01-16 DIAGNOSIS — R160 Hepatomegaly, not elsewhere classified: Secondary | ICD-10-CM | POA: Insufficient documentation

## 2020-01-16 DIAGNOSIS — N979 Female infertility, unspecified: Secondary | ICD-10-CM | POA: Insufficient documentation

## 2020-01-16 DIAGNOSIS — K219 Gastro-esophageal reflux disease without esophagitis: Secondary | ICD-10-CM | POA: Insufficient documentation

## 2020-01-17 ENCOUNTER — Ambulatory Visit: Payer: BC Managed Care – PPO | Admitting: Cardiology

## 2020-01-22 DIAGNOSIS — Z332 Encounter for elective termination of pregnancy: Secondary | ICD-10-CM | POA: Diagnosis not present

## 2020-01-22 DIAGNOSIS — Z6839 Body mass index (BMI) 39.0-39.9, adult: Secondary | ICD-10-CM | POA: Diagnosis not present

## 2020-01-23 DIAGNOSIS — I509 Heart failure, unspecified: Secondary | ICD-10-CM | POA: Diagnosis not present

## 2020-01-23 DIAGNOSIS — L680 Hirsutism: Secondary | ICD-10-CM | POA: Diagnosis not present

## 2020-01-23 DIAGNOSIS — R079 Chest pain, unspecified: Secondary | ICD-10-CM | POA: Diagnosis not present

## 2020-01-23 DIAGNOSIS — M109 Gout, unspecified: Secondary | ICD-10-CM | POA: Diagnosis not present

## 2020-01-23 DIAGNOSIS — Z658 Other specified problems related to psychosocial circumstances: Secondary | ICD-10-CM | POA: Diagnosis not present

## 2020-01-23 DIAGNOSIS — I428 Other cardiomyopathies: Secondary | ICD-10-CM | POA: Diagnosis not present

## 2020-01-23 DIAGNOSIS — G894 Chronic pain syndrome: Secondary | ICD-10-CM | POA: Diagnosis not present

## 2020-01-23 DIAGNOSIS — F419 Anxiety disorder, unspecified: Secondary | ICD-10-CM | POA: Diagnosis not present

## 2020-01-23 DIAGNOSIS — Z30430 Encounter for insertion of intrauterine contraceptive device: Secondary | ICD-10-CM | POA: Diagnosis not present

## 2020-01-23 DIAGNOSIS — E119 Type 2 diabetes mellitus without complications: Secondary | ICD-10-CM | POA: Diagnosis not present

## 2020-01-23 DIAGNOSIS — I110 Hypertensive heart disease with heart failure: Secondary | ICD-10-CM | POA: Diagnosis not present

## 2020-01-23 DIAGNOSIS — R0789 Other chest pain: Secondary | ICD-10-CM | POA: Diagnosis not present

## 2020-01-23 DIAGNOSIS — R9431 Abnormal electrocardiogram [ECG] [EKG]: Secondary | ICD-10-CM | POA: Diagnosis not present

## 2020-01-23 DIAGNOSIS — G4733 Obstructive sleep apnea (adult) (pediatric): Secondary | ICD-10-CM | POA: Diagnosis not present

## 2020-01-23 DIAGNOSIS — R109 Unspecified abdominal pain: Secondary | ICD-10-CM | POA: Diagnosis not present

## 2020-01-23 DIAGNOSIS — Z9289 Personal history of other medical treatment: Secondary | ICD-10-CM | POA: Insufficient documentation

## 2020-01-23 DIAGNOSIS — Z332 Encounter for elective termination of pregnancy: Secondary | ICD-10-CM | POA: Diagnosis not present

## 2020-01-23 DIAGNOSIS — N2889 Other specified disorders of kidney and ureter: Secondary | ICD-10-CM | POA: Diagnosis not present

## 2020-01-23 DIAGNOSIS — F329 Major depressive disorder, single episode, unspecified: Secondary | ICD-10-CM | POA: Diagnosis not present

## 2020-01-23 HISTORY — PX: OTHER SURGICAL HISTORY: SHX169

## 2020-01-24 DIAGNOSIS — G4733 Obstructive sleep apnea (adult) (pediatric): Secondary | ICD-10-CM | POA: Diagnosis not present

## 2020-01-24 DIAGNOSIS — Z30430 Encounter for insertion of intrauterine contraceptive device: Secondary | ICD-10-CM | POA: Diagnosis not present

## 2020-01-24 DIAGNOSIS — Z8679 Personal history of other diseases of the circulatory system: Secondary | ICD-10-CM | POA: Diagnosis not present

## 2020-01-24 DIAGNOSIS — F329 Major depressive disorder, single episode, unspecified: Secondary | ICD-10-CM | POA: Diagnosis not present

## 2020-01-24 DIAGNOSIS — Z658 Other specified problems related to psychosocial circumstances: Secondary | ICD-10-CM | POA: Diagnosis not present

## 2020-01-24 DIAGNOSIS — Z332 Encounter for elective termination of pregnancy: Secondary | ICD-10-CM | POA: Diagnosis not present

## 2020-01-24 DIAGNOSIS — R9431 Abnormal electrocardiogram [ECG] [EKG]: Secondary | ICD-10-CM | POA: Diagnosis not present

## 2020-01-24 DIAGNOSIS — N2889 Other specified disorders of kidney and ureter: Secondary | ICD-10-CM | POA: Diagnosis not present

## 2020-01-24 DIAGNOSIS — R109 Unspecified abdominal pain: Secondary | ICD-10-CM | POA: Diagnosis not present

## 2020-01-24 DIAGNOSIS — M109 Gout, unspecified: Secondary | ICD-10-CM | POA: Diagnosis not present

## 2020-01-24 DIAGNOSIS — L680 Hirsutism: Secondary | ICD-10-CM | POA: Diagnosis not present

## 2020-01-24 DIAGNOSIS — I509 Heart failure, unspecified: Secondary | ICD-10-CM | POA: Diagnosis not present

## 2020-01-24 DIAGNOSIS — I110 Hypertensive heart disease with heart failure: Secondary | ICD-10-CM | POA: Diagnosis not present

## 2020-01-24 DIAGNOSIS — E119 Type 2 diabetes mellitus without complications: Secondary | ICD-10-CM | POA: Diagnosis not present

## 2020-01-24 DIAGNOSIS — F419 Anxiety disorder, unspecified: Secondary | ICD-10-CM | POA: Diagnosis not present

## 2020-01-24 DIAGNOSIS — G894 Chronic pain syndrome: Secondary | ICD-10-CM | POA: Diagnosis not present

## 2020-01-24 DIAGNOSIS — R0789 Other chest pain: Secondary | ICD-10-CM | POA: Diagnosis not present

## 2020-01-24 DIAGNOSIS — I428 Other cardiomyopathies: Secondary | ICD-10-CM | POA: Diagnosis not present

## 2020-01-26 NOTE — Progress Notes (Signed)
Cardiology Office Note:    Date:  01/30/2020   ID:  Renee Roberson, DOB 1980/06/02, MRN [id number]  PCP:  Donald Prose, MD  Cardiologist:  [name], MD   Referring MD: Donald Prose, MD   Chief Complaint  Patient presents with  . Hypertension    History of Present Illness:    Renee Roberson is a 39 y.o. female with a hx of HTN, PCOS,obesity,chest pain,and chronic combined systolic and diastolic HF.A priorejection fraction was 25% in September 2014 and improved to near normal with current LVEF 60 to 65% with mild LVH 11/2019. She continues to have diastolic dysfunction.  She is post pregnancy termination.  Her blood pressures have been running high at home.  She is still on labetalol and furosemide.  Pressures have been running around 180/100 mmHg.  Past Medical History:  Diagnosis Date  . Abnormal Pap smear of cervix   . Accelerated essential hypertension 03/20/2013   Left ventricular systolic dysfunction as a complication   . Advanced maternal age, 1st pregnancy 12/12/2019   Formatting of this note might be different from the original. Dating: 06/03/2020 by U/S                                             First trimester:  '[ ]'  Prenatal labs reviewed  '[ ]'  SMA/Hgb electrophoresis/CF   '[ ]'  Early 1 hr GTT*  '[ ]'  Baseline HELLP* '[ ]'  LDASA* '[ ]'  TB screening* '[ ]'  Medicaid patients: Pregnancy Medical Home Risk Screen (Code [id number])* Third trimester:  '[ ]'  CBC/RPR/HIV '[ ]'  1hr GTT '[ ]'   . Amenorrhea   . Chest pain    Coronary CTA 04/2019: Calcium score 0; no evidence of CAD  . Chronic combined systolic and diastolic heart failure ([place]) 11/13/2013   LVEF improved from 25% to 45% after 6 months of antihypertensive therapy   . Colchicine overdose 05/29/2019  . Conductive hearing loss of left ear with unrestricted hearing of right ear 11/02/2017  . Congestive heart failure ([place]) 12/12/2019   Last Assessment & Plan:  Formatting of this note might be different from the original. No new  symptoms. Is continued labetalol 200 mg BID with good effect.  . Daytime sleepiness 08/17/2016  . Depression   . Diverticulitis of large intestine 12/30/2015  . GERD (gastroesophageal reflux disease)   . Gestational diabetes mellitus (GDM) in second trimester 01/15/2020   Last Assessment & Plan:  Formatting of this note might be different from the original. Patient failed 1hr gtt at 175 and then failed 3 hr gtt. She has been trying medical nutrition therapy over the last few weeks with good effect although she does not have a log with her today. I discussed with Ms. Wardrop whether these labs results indicated a diagnosis of gestational diabetes or preexisting diabe  . Gout   . Hepatic steatosis   . Hepatomegaly   . Hirsutism   . HTN (hypertension)   . Hypotension 08/20/2014  . Impacted cerumen of left ear 11/02/2017  . Infertility, female   . Migraine   . Morbid obesity ([place]) 03/20/2013  . OSA on CPAP 02/21/2018   Home sleep study showed severe obstructive sleep apnea with an AHI of 62.3 on night 1 and 92.0 and night to with oxygen desaturations less than 70% for 1 minute.  A total of 1 hour  and 22 minutes was noted with O2 sats less than 89%.  52% of the time was spent with oxygen saturations less than 90%. Now on CPAP at 14 cm H2O   . Polycystic ovary syndrome 03/20/2013  . Renal mass   . Snoring 08/17/2016  . STD (sexually transmitted disease)    HSV (genital)  . Systolic heart failure [facility])     Past Surgical History:  Procedure Laterality Date  . COLPOSCOPY  2015  . no surgical hx      Current Medications: Current Meds  Medication Sig  . acetaminophen (TYLENOL) 500 MG tablet Take 500 mg by mouth every 6 (six) hours as needed for mild pain.  Marland Kitchen allopurinol (ZYLOPRIM) 100 MG tablet Take 100 mg by mouth daily.   Marland Kitchen aspirin EC 81 MG tablet Take 81 mg by mouth daily. Swallow whole.  Marland Kitchen aspirin-acetaminophen-caffeine (EXCEDRIN MIGRAINE) 250-250-65 MG tablet Take by mouth as needed for  headache.  . COLCHICINE PO Take by mouth as needed.   . furosemide (LASIX) 40 MG tablet Take 1 tablet (40 mg total) by mouth daily.  Marland Kitchen omeprazole (PRILOSEC) 40 MG capsule Take 40 mg by mouth as needed (heart burn/gerd).  . sertraline (ZOLOFT) 50 MG tablet Take 50 mg by mouth daily.  . [DISCONTINUED] labetalol (NORMODYNE) 200 MG tablet Take 1.5 tablets (300 mg total) by mouth 2 (two) times daily.     Allergies:   Fish allergy   Social History   Socioeconomic History  . Marital status: Single    Spouse name: Not on file  . Number of children: Not on file  . Years of education: Not on file  . Highest education level: Not on file  Occupational History  . Not on file  Tobacco Use  . Smoking status: Never Smoker  . Smokeless tobacco: Never Used  Vaping Use  . Vaping Use: Never used  Substance and Sexual Activity  . Alcohol use: No    Alcohol/week: 0.0 standard drinks  . Drug use: No  . Sexual activity: Yes    Partners: Male  Other Topics Concern  . Not on file  Social History Narrative  . Not on file   Social Determinants of Health   Financial Resource Strain:   . Difficulty of Paying Living Expenses: Not on file  Food Insecurity:   . Worried About Charity fundraiser in the Last Year: Not on file  . Ran Out of Food in the Last Year: Not on file  Transportation Needs:   . Lack of Transportation (Medical): Not on file  . Lack of Transportation (Non-Medical): Not on file  Physical Activity:   . Days of Exercise per Week: Not on file  . Minutes of Exercise per Session: Not on file  Stress:   . Feeling of Stress : Not on file  Social Connections:   . Frequency of Communication with Friends and Family: Not on file  . Frequency of Social Gatherings with Friends and Family: Not on file  . Attends Religious Services: Not on file  . Active Member of Clubs or Organizations: Not on file  . Attends Archivist Meetings: Not on file  . Marital Status: Not on file      Family History: The patient's family history includes Cancer in her maternal grandmother; Diabetes in her paternal grandfather; Hypertension in her father, mother, and sister; Thyroid disease in her father.  ROS:   Please see the history of present illness.    Tearful  has had a difficult time dealing with the outcome of her pregnancy.  She has had a difficult time sleeping.  All other systems reviewed and are negative.  EKGs/Labs/Other Studies Reviewed:    The following studies were reviewed today: No new data  EKG:  EKG not repeated  Recent Labs: 05/30/2019: ALT 34; BUN 10; Creatinine, Ser 0.80; Hemoglobin 13.5; Platelets 314; Potassium 4.0; Sodium 136 12/08/2019: NT-Pro BNP 8  Recent Lipid Panel No results found for: CHOL, TRIG, HDL, CHOLHDL, VLDL, LDLCALC, LDLDIRECT  Physical Exam:    VS:  BP (!) 180/108   Pulse 82   Ht '5\' 5"'  (1.651 m)   Wt 230 lb 3.2 oz (104.4 kg)   LMP 08/22/2019 (Approximate)   SpO2 97%   BMI 38.31 kg/m     Wt Readings from Last 3 Encounters:  01/30/20 230 lb 3.2 oz (104.4 kg)  12/18/19 239 lb 9.6 oz (108.7 kg)  11/27/19 239 lb 6.4 oz (108.6 kg)     GEN: Overweight. No acute distress HEENT: Normal NECK: No JVD. LYMPHATICS: No lymphadenopathy CARDIAC:  RRR without murmur, gallop, or edema. VASCULAR:  Normal Pulses. No bruits. RESPIRATORY:  Clear to auscultation without rales, wheezing or rhonchi  ABDOMEN: Soft, non-tender, non-distended, No pulsatile mass, MUSCULOSKELETAL: No deformity  SKIN: Warm and dry NEUROLOGIC:  Alert and oriented x 3 PSYCHIATRIC:  Normal affect   ASSESSMENT:    1. Chronic combined systolic and diastolic heart failure ([place])   2. Hypertensive heart disease with chronic systolic congestive heart failure ([place])   3. OSA on CPAP   4. Morbid obesity ([place])   5. Hyperlipidemia LDL goal <70   6. Educated about COVID-19 virus infection    PLAN:    In order of problems listed above:  1. During the pregnancy, LV  function was documented to be normal. 2. Resume carvedilol and discontinue Normodyne.  Resume Lotrel as prior to to pregnancy.  Monitor blood pressure at home.  Low-salt diet.  Follow-up blood pressure check in 2 weeks and at BP clinic.  Be met will be done at that time. 3. Encourage compliance with CPAP. 4. Encouraged consideration of weight loss via diet and exercise. 5. Did not discuss lipids.  Last LDL was 132.  We need to consider managing this with therapy given her high cardiovascular risk.  We should likely start low-dose statin at next visit with me or in the blood pressure clinic. 6. Vaccination and mitigation technique are being practiced to avoid COVID-19 disease.   Medication Adjustments/Labs and Tests Ordered: Current medicines are reviewed at length with the patient today.  Concerns regarding medicines are outlined above.  Orders Placed This Encounter  Procedures  . Basic metabolic panel  . AMB Referral to [facility]   Meds ordered this encounter  Medications  . carvedilol (COREG) 25 MG tablet    Sig: Take 1 tablet (25 mg total) by mouth 2 (two) times daily.    Dispense:  180 tablet    Refill:  3    D/c Labetolol  . amLODipine-benazepril (LOTREL) 5-40 MG capsule    Sig: Take 1 capsule by mouth daily.    Dispense:  90 capsule    Refill:  3    Patient Instructions  Medication Instructions:  1) DISCONTINUE Labetalol 2) RESTART Carvedilol 38m twice daily 3) RESTART Lotrel 5/458monce daily  *If you need a refill on your cardiac medications before your next appointment, please call your pharmacy*   Lab Work: BMET when you  come to see Hypertension clinic  If you have labs (blood work) drawn today and your tests are completely normal, you will receive your results only by: Marland Kitchen MyChart Message (if you have MyChart) OR . A paper copy in the mail If you have any lab test that is abnormal or we need to change your treatment, we will call you to review the  results.   Testing/Procedures: None   Follow-Up: At [facility], you and your health needs are our priority.  As part of our continuing mission to provide you with exceptional heart care, we have created designated Provider Care Teams.  These Care Teams include your primary Cardiologist (physician) and Advanced Practice Providers (APPs -  Physician Assistants and Nurse Practitioners) who all work together to provide you with the care you need, when you need it.  We recommend signing up for the patient portal called "MyChart".  Sign up information is provided on this After Visit Summary.  MyChart is used to connect with patients for Virtual Visits (Telemedicine).  Patients are able to view lab/test results, encounter notes, upcoming appointments, etc.  Non-urgent messages can be sent to your provider as well.   To learn more about what you can do with MyChart, go to [email or web address].    Your next appointment:   6 week(s)  The format for your next appointment:   In Person  Provider:   You may see [name], MD or one of the following Advanced Practice Providers on your designated Care Team:    Truitt Merle, NP  Cecilie Kicks, NP  Kathyrn Drown, NP    Other Instructions  Your physician recommends that you see our Hypertension Clinic in 2 weeks.     Signed, [name], MD  01/30/2020 2:35 PM    [place]

## 2020-01-30 ENCOUNTER — Ambulatory Visit (INDEPENDENT_AMBULATORY_CARE_PROVIDER_SITE_OTHER): Payer: BC Managed Care – PPO | Admitting: Interventional Cardiology

## 2020-01-30 ENCOUNTER — Other Ambulatory Visit: Payer: Self-pay

## 2020-01-30 ENCOUNTER — Encounter: Payer: Self-pay | Admitting: Interventional Cardiology

## 2020-01-30 VITALS — BP 180/108 | HR 82 | Ht 65.0 in | Wt 230.2 lb

## 2020-01-30 DIAGNOSIS — I110 Hypertensive heart disease with heart failure: Secondary | ICD-10-CM

## 2020-01-30 DIAGNOSIS — I5042 Chronic combined systolic (congestive) and diastolic (congestive) heart failure: Secondary | ICD-10-CM

## 2020-01-30 DIAGNOSIS — Z7189 Other specified counseling: Secondary | ICD-10-CM

## 2020-01-30 DIAGNOSIS — G4733 Obstructive sleep apnea (adult) (pediatric): Secondary | ICD-10-CM

## 2020-01-30 DIAGNOSIS — Z9989 Dependence on other enabling machines and devices: Secondary | ICD-10-CM

## 2020-01-30 DIAGNOSIS — I5022 Chronic systolic (congestive) heart failure: Secondary | ICD-10-CM

## 2020-01-30 DIAGNOSIS — E785 Hyperlipidemia, unspecified: Secondary | ICD-10-CM

## 2020-01-30 MED ORDER — CARVEDILOL 25 MG PO TABS: 25.0000 mg | ORAL_TABLET | Freq: Two times a day (BID) | ORAL | 3 refills | Status: DC

## 2020-01-30 MED ORDER — AMLODIPINE BESY-BENAZEPRIL HCL 5-40 MG PO CAPS: 1.0000 | ORAL_CAPSULE | Freq: Every day | ORAL | 3 refills | Status: DC

## 2020-01-30 NOTE — Patient Instructions (Signed)
Medication Instructions:  1) DISCONTINUE Labetalol 2) RESTART Carvedilol 25mg  twice daily 3) RESTART Lotrel 5/40mg  once daily  *If you need a refill on your cardiac medications before your next appointment, please call your pharmacy*   Lab Work: BMET when you come to see Hypertension clinic  If you have labs (blood work) drawn today and your tests are completely normal, you will receive your results only by: Marland Kitchen MyChart Message (if you have MyChart) OR . A paper copy in the mail If you have any lab test that is abnormal or we need to change your treatment, we will call you to review the results.   Testing/Procedures: None   Follow-Up: At [facility], you and your health needs are our priority.  As part of our continuing mission to provide you with exceptional heart care, we have created designated Provider Care Teams.  These Care Teams include your primary Cardiologist (physician) and Advanced Practice Providers (APPs -  Physician Assistants and Nurse Practitioners) who all work together to provide you with the care you need, when you need it.  We recommend signing up for the patient portal called "MyChart".  Sign up information is provided on this After Visit Summary.  MyChart is used to connect with patients for Virtual Visits (Telemedicine).  Patients are able to view lab/test results, encounter notes, upcoming appointments, etc.  Non-urgent messages can be sent to your provider as well.   To learn more about what you can do with MyChart, go to [email or web address].    Your next appointment:   6 week(s)  The format for your next appointment:   In Person  Provider:   You may see [name], MD or one of the following Advanced Practice Providers on your designated Care Team:    Truitt Merle, NP  Cecilie Kicks, NP  Kathyrn Drown, NP    Other Instructions  Your physician recommends that you see our Hypertension Clinic in 2 weeks.

## 2020-02-08 ENCOUNTER — Encounter: Payer: Self-pay | Admitting: Surgery

## 2020-02-08 ENCOUNTER — Ambulatory Visit (INDEPENDENT_AMBULATORY_CARE_PROVIDER_SITE_OTHER): Payer: BC Managed Care – PPO | Admitting: Surgery

## 2020-02-08 ENCOUNTER — Ambulatory Visit: Payer: Self-pay

## 2020-02-08 VITALS — BP 145/93 | HR 84 | Ht 65.0 in | Wt 230.2 lb

## 2020-02-08 DIAGNOSIS — M25432 Effusion, left wrist: Secondary | ICD-10-CM

## 2020-02-08 DIAGNOSIS — M654 Radial styloid tenosynovitis [de Quervain]: Secondary | ICD-10-CM | POA: Diagnosis not present

## 2020-02-08 DIAGNOSIS — M25532 Pain in left wrist: Secondary | ICD-10-CM

## 2020-02-08 NOTE — Progress Notes (Signed)
Office Visit Note   Patient: Renee Roberson           Date of Birth: 1981/04/01           MRN: [id number] Visit Date: 02/08/2020              Requested by: Renee Prose, MD [address] [place],  [place] [id number] PCP: Renee Prose, MD   Assessment & Plan: Visit Diagnoses:  1. Pain in left wrist   2. De Quervain's disease (radial styloid tenosynovitis)   3. Wrist swelling, left     Plan: The patient's acute thumb pain will attend conservative treatment.  I did offer conservative management with Marcaine/Depo-Medrol first dorsal compartment injection but patient elected the other option of using Voltaren gel twice daily.  She can get this over-the-counter.  I also gave patient a removable thumb spica splint to use at times during the day and also when she is sleeping.  She will come out of this to work gentle range of motion of her thumb and wrist.  With the acute nature of her pain along with swelling in the wrist and hand I had blood work drawn today to check a CBC and arthritis panel.  Patient will follow up with me in 1 week for recheck and I will review labs at that time.  If she still continues to have pain in the radial wrist I will plan to do injection at that time.  All questions answered.  Follow-Up Instructions: Return in about 1 week (around 02/15/2020) for with Renee Roberson recheck left wrist.   Orders:  Orders Placed This Encounter  Procedures  . XR Wrist 2 Views Left  . CBC  . Antinuclear Antib (ANA)  . Uric acid  . Rheumatoid Factor  . Sed Rate (ESR)   No orders of the defined types were placed in this encounter.     Procedures: No procedures performed   Clinical Data: No additional findings.   Subjective: Chief Complaint  Patient presents with  . Left Hand - Injury    HPI 39 year old black female comes in today with complaints of acute left hand pain and swelling.  States that problem ongoing for 2 weeks.  No injury.  She localizes most of  the pain and swelling to the radial aspect of her wrist and this extends up the forearm some.  Pain when she is gripping objects and abducting her thumb.  States that oral NSAIDs messed up her stomach.  Has taken Tylenol without any relief.  Patient does have a known history of gout but states that this is only affected the feet in the past.  Denies known personal or family history of lupus rheumatoid arthritis.  No complaints of fever chills. Review of Systems No current cardiac pulmonary GI GU issues  Objective: Vital Signs: BP (!) 145/93   Pulse 84   Ht '5\' 5"'  (1.651 m)   Wt 230 lb 3.2 oz (104.4 kg)   LMP 08/22/2019 (Approximate)   BMI 38.31 kg/m   Physical Exam Constitutional:      Appearance: She is obese.  HENT:     Head: Normocephalic and atraumatic.  Eyes:     Extraocular Movements: Extraocular movements intact.     Pupils: Pupils are equal, round, and reactive to light.  Pulmonary:     Effort: No respiratory distress.  Musculoskeletal:     Comments: Gait is normal.  Exam left wrist she does have some swelling that extends  somewhat down into the hand.  No signs of infection.  She is tender over the dorsal aspect of the left wrist.  Patient has marked tenderness over the first dorsal compartment and again there is some swelling in this area.  Positive Tinel's over the left carpal tunnel.  Positive Finkelstein's test.  Left elbow unremarkable.  Right upper extremity unremarkable.  Neurological:     General: No focal deficit present.     Mental Status: She is alert and oriented to person, place, and time.  Psychiatric:        Mood and Affect: Mood normal.     Ortho Exam  Specialty Comments:  No specialty comments available.  Imaging: No results found.   PMFS History: Patient Active Problem List   Diagnosis Date Noted  . Systolic heart failure ([place])   . STD (sexually transmitted disease)   . Migraine   . Infertility, female   . Hirsutism   . Hepatomegaly   .  Hepatic steatosis   . GERD (gastroesophageal reflux disease)   . Depression   . Chest pain   . Amenorrhea   . Abnormal Pap smear of cervix   . Gestational diabetes mellitus (GDM) in second trimester 01/15/2020  . Advanced maternal age, 1st pregnancy 12/12/2019  . Congestive heart failure ([place]) 12/12/2019  . Gout 12/12/2019  . Renal mass 12/12/2019  . Colchicine overdose 05/29/2019  . OSA on CPAP 02/21/2018  . Conductive hearing loss of left ear with unrestricted hearing of right ear 11/02/2017  . Impacted cerumen of left ear 11/02/2017  . Snoring 08/17/2016  . Daytime sleepiness 08/17/2016  . Diverticulitis of large intestine 12/30/2015  . Hypotension 08/20/2014  . Chronic combined systolic and diastolic heart failure ([place]) 11/13/2013  . HTN (hypertension) 03/20/2013    Class: Chronic  . Morbid obesity ([place]) 03/20/2013  . Polycystic ovary syndrome 03/20/2013    Class: Chronic  . Accelerated essential hypertension 03/20/2013   Past Medical History:  Diagnosis Date  . Abnormal Pap smear of cervix   . Accelerated essential hypertension 03/20/2013   Left ventricular systolic dysfunction as a complication   . Advanced maternal age, 1st pregnancy 12/12/2019   Formatting of this note might be different from the original. Dating: 06/03/2020 by U/S                                             First trimester:  '[ ]'  Prenatal labs reviewed  '[ ]'  SMA/Hgb electrophoresis/CF   '[ ]'  Early 1 hr GTT*  '[ ]'  Baseline HELLP* '[ ]'  LDASA* '[ ]'  TB screening* '[ ]'  Medicaid patients: Pregnancy Medical Home Risk Screen (Code [id number])* Third trimester:  '[ ]'  CBC/RPR/HIV '[ ]'  1hr GTT '[ ]'   . Amenorrhea   . Chest pain    Coronary CTA 04/2019: Calcium score 0; no evidence of CAD  . Chronic combined systolic and diastolic heart failure ([place]) 11/13/2013   LVEF improved from 25% to 45% after 6 months of antihypertensive therapy   . Colchicine overdose 05/29/2019  . Conductive hearing loss of left ear with unrestricted hearing  of right ear 11/02/2017  . Congestive heart failure ([place]) 12/12/2019   Last Assessment & Plan:  Formatting of this note might be different from the original. No new symptoms. Is continued labetalol 200 mg BID with good effect.  . Daytime sleepiness 08/17/2016  . Depression   .  Diverticulitis of large intestine 12/30/2015  . GERD (gastroesophageal reflux disease)   . Gestational diabetes mellitus (GDM) in second trimester 01/15/2020   Last Assessment & Plan:  Formatting of this note might be different from the original. Patient failed 1hr gtt at 175 and then failed 3 hr gtt. She has been trying medical nutrition therapy over the last few weeks with good effect although she does not have a log with her today. I discussed with Ms. Dura whether these labs results indicated a diagnosis of gestational diabetes or preexisting diabe  . Gout   . Hepatic steatosis   . Hepatomegaly   . Hirsutism   . HTN (hypertension)   . Hypotension 08/20/2014  . Impacted cerumen of left ear 11/02/2017  . Infertility, female   . Migraine   . Morbid obesity ([place]) 03/20/2013  . OSA on CPAP 02/21/2018   Home sleep study showed severe obstructive sleep apnea with an AHI of 62.3 on night 1 and 92.0 and night to with oxygen desaturations less than 70% for 1 minute.  A total of 1 hour and 22 minutes was noted with O2 sats less than 89%.  52% of the time was spent with oxygen saturations less than 90%. Now on CPAP at 14 cm H2O   . Polycystic ovary syndrome 03/20/2013  . Renal mass   . Snoring 08/17/2016  . STD (sexually transmitted disease)    HSV (genital)  . Systolic heart failure ([facility])     Family History  Problem Relation Age of Onset  . Hypertension Mother   . Hypertension Father   . Thyroid disease Father   . Hypertension Sister   . Cancer Maternal Grandmother        ovarian or colon  . Diabetes Paternal Grandfather     Past Surgical History:  Procedure Laterality Date  . COLPOSCOPY  2015  . no surgical hx      Social History   Occupational History  . Not on file  Tobacco Use  . Smoking status: Never Smoker  . Smokeless tobacco: Never Used  Vaping Use  . Vaping Use: Never used  Substance and Sexual Activity  . Alcohol use: No    Alcohol/week: 0.0 standard drinks  . Drug use: No  . Sexual activity: Yes    Partners: Male

## 2020-02-09 ENCOUNTER — Ambulatory Visit: Payer: BC Managed Care – PPO | Admitting: Cardiology

## 2020-02-10 LAB — CBC
HCT: 36.1 % (ref 35.0–45.0)
Hemoglobin: 12 g/dL (ref 11.7–15.5)
MCH: 28.2 pg (ref 27.0–33.0)
MCHC: 33.2 g/dL (ref 32.0–36.0)
MCV: 84.9 fL (ref 80.0–100.0)
MPV: 9.2 fL (ref 7.5–12.5)
Platelets: 283 10*3/uL (ref 140–400)
RBC: 4.25 10*6/uL (ref 3.80–5.10)
RDW: 13.2 % (ref 11.0–15.0)
WBC: 4.9 10*3/uL (ref 3.8–10.8)

## 2020-02-10 LAB — SEDIMENTATION RATE: Sed Rate: 9 mm/h (ref 0–20)

## 2020-02-10 LAB — RHEUMATOID FACTOR: Rheumatoid fact SerPl-aCnc: <14 IU/mL (ref <14)

## 2020-02-10 LAB — ANTI-NUCLEAR AB-TITER (ANA TITER)
ANA TITER: 1:40 {titer} — ABNORMAL HIGH
ANA Titer 1: 1:40 {titer} — ABNORMAL HIGH

## 2020-02-10 LAB — ANA: Anti Nuclear Antibody (ANA): POSITIVE — AB

## 2020-02-10 LAB — URIC ACID: Uric Acid, Serum: 8.1 mg/dL — ABNORMAL HIGH (ref 2.5–7.0)

## 2020-02-13 ENCOUNTER — Encounter: Payer: Self-pay | Admitting: Surgery

## 2020-02-13 DIAGNOSIS — Z30431 Encounter for routine checking of intrauterine contraceptive device: Secondary | ICD-10-CM | POA: Diagnosis not present

## 2020-02-13 DIAGNOSIS — Z332 Encounter for elective termination of pregnancy: Secondary | ICD-10-CM | POA: Diagnosis not present

## 2020-02-15 ENCOUNTER — Ambulatory Visit (INDEPENDENT_AMBULATORY_CARE_PROVIDER_SITE_OTHER): Payer: BC Managed Care – PPO | Admitting: Pharmacist

## 2020-02-15 ENCOUNTER — Other Ambulatory Visit: Payer: Self-pay

## 2020-02-15 ENCOUNTER — Other Ambulatory Visit: Payer: BC Managed Care – PPO | Admitting: *Deleted

## 2020-02-15 ENCOUNTER — Ambulatory Visit (INDEPENDENT_AMBULATORY_CARE_PROVIDER_SITE_OTHER): Payer: BC Managed Care – PPO | Admitting: Surgery

## 2020-02-15 ENCOUNTER — Encounter: Payer: Self-pay | Admitting: Surgery

## 2020-02-15 ENCOUNTER — Ambulatory Visit: Payer: BC Managed Care – PPO | Admitting: Surgery

## 2020-02-15 ENCOUNTER — Encounter: Payer: Self-pay | Admitting: Pharmacist

## 2020-02-15 VITALS — BP 128/88 | HR 87

## 2020-02-15 DIAGNOSIS — I10 Essential (primary) hypertension: Secondary | ICD-10-CM

## 2020-02-15 DIAGNOSIS — I5042 Chronic combined systolic (congestive) and diastolic (congestive) heart failure: Secondary | ICD-10-CM

## 2020-02-15 DIAGNOSIS — I5022 Chronic systolic (congestive) heart failure: Secondary | ICD-10-CM

## 2020-02-15 DIAGNOSIS — M654 Radial styloid tenosynovitis [de Quervain]: Secondary | ICD-10-CM | POA: Diagnosis not present

## 2020-02-15 DIAGNOSIS — E282 Polycystic ovarian syndrome: Secondary | ICD-10-CM

## 2020-02-15 DIAGNOSIS — R768 Other specified abnormal immunological findings in serum: Secondary | ICD-10-CM | POA: Diagnosis not present

## 2020-02-15 DIAGNOSIS — M1A0420 Idiopathic chronic gout, left hand, without tophus (tophi): Secondary | ICD-10-CM

## 2020-02-15 DIAGNOSIS — I110 Hypertensive heart disease with heart failure: Secondary | ICD-10-CM | POA: Diagnosis not present

## 2020-02-15 LAB — BASIC METABOLIC PANEL
BUN/Creatinine Ratio: 14 (ref 9–23)
BUN: 12 mg/dL (ref 6–20)
CO2: 22 mmol/L (ref 20–29)
Calcium: 9.2 mg/dL (ref 8.7–10.2)
Chloride: 100 mmol/L (ref 96–106)
Creatinine, Ser: 0.87 mg/dL (ref 0.57–1.00)
GFR calc Af Amer: 98 mL/min/{1.73_m2} (ref >59)
GFR calc non Af Amer: 85 mL/min/{1.73_m2} (ref >59)
Glucose: 125 mg/dL — ABNORMAL HIGH (ref 65–99)
Potassium: 3.9 mmol/L (ref 3.5–5.2)
Sodium: 137 mmol/L (ref 134–144)

## 2020-02-15 MED ORDER — LIDOCAINE HCL 1 % IJ SOLN
0.3000 mL | INTRAMUSCULAR | Status: AC | PRN
  Administered 2020-02-15: .3 mL

## 2020-02-15 MED ORDER — METHYLPREDNISOLONE ACETATE 40 MG/ML IJ SUSP
13.3300 mg | INTRAMUSCULAR | Status: AC | PRN
  Administered 2020-02-15: 13.33 mg

## 2020-02-15 MED ORDER — BUPIVACAINE HCL 0.25 % IJ SOLN
0.3300 mL | INTRAMUSCULAR | Status: AC | PRN
  Administered 2020-02-15: .33 mL

## 2020-02-15 NOTE — Progress Notes (Signed)
Patient ID: Renee Roberson                 DOB: Jan 30, 1981                      MRN: [id number]     HPI: Renee Roberson is a 39 y.o. AA female referred by Dr. Tamala Julian to HTN clinic. PMH is significant for CHF, HTN, OSA, PCOS, and obesity. A priorejection fraction was 25% in September 2014and improved to near normal with current LVEF 60 to 65% with mild LVH 11/2019.She continues to have diastolic dysfunction.  Seen by Dr Tamala Julian on 01/30/20 and BP was 180/108.  Labetalol was discontinued and patient was restarted on carvedilol and amlodipine/benazapril which she was managed on prior to pregnancy. Patient is no longer pregnant and now has IUD placed.  Patient reports having a difficult last few weeks.  Reports depression, anxiety, tachycardia, and blood pressure fluctuations.  Reports readings vary up to 150s and higher.  Recently received a steroid injection for thumb pain which occurred after an IV placement.  Her previous OB has left her practice.  While she endorses depression, she has also self discontinued sertraline because she did not think it was working and she did not want to take any more pills.  Is nervous about scheduling an appointment with her PCP since she does not want more medications.  Recently referred to rheumatology for possible lupus.    Has history of OSA and wears a CPAP.  Due to health concerns recently has not been sleeping well and has been falling asleep during the day, usually without her mask.    Patient was diagnosed with GDM during pregnancy but she does not know if she had preexisting T2DM.  Last A1c was from 2019.   Has BMP pending today.  Currently on FMLA and disability but FMLA runs out tomorrow.  Does not feel as if she can go back to work yet but does not know who she should have sign her paperwork.  Current HTN meds: amlodipine-benazapril 5-40 daily, carvedilol 25 mg BID, furosemide 40 mg daily Previously tried: carvedilol 12.5, furosemide 20, labetalol  200, metoprolol tartrate 100, nifedipine XL 30mg , spironolactone 12.5, 25 BP goal: <130/80  Social History: no tobacco use  Wt Readings from Last 3 Encounters:  02/08/20 230 lb 3.2 oz (104.4 kg)  01/30/20 230 lb 3.2 oz (104.4 kg)  12/18/19 239 lb 9.6 oz (108.7 kg)   BP Readings from Last 3 Encounters:  02/08/20 (!) 145/93  01/30/20 (!) 180/108  12/18/19 124/78   Pulse Readings from Last 3 Encounters:  02/08/20 84  01/30/20 82  12/18/19 (!) 106    Renal function: CrCl cannot be calculated (Patient's most recent lab result is older than the maximum 21 days allowed.).  Past Medical History:  Diagnosis Date  . Abnormal Pap smear of cervix   . Accelerated essential hypertension 03/20/2013   Left ventricular systolic dysfunction as a complication   . Advanced maternal age, 1st pregnancy 12/12/2019   Formatting of this note might be different from the original. Dating: 06/03/2020 by U/S                                             First trimester:  [ ]  Prenatal labs reviewed  [ ]  SMA/Hgb electrophoresis/CF   [ ]  Early 1  hr GTT*  [ ]  Baseline HELLP* [ ]  LDASA* [ ]  TB screening* [ ]  Medicaid patients: Pregnancy Medical Home Risk Screen (Code [phone])* Third trimester:  [ ]  CBC/RPR/HIV [ ]  1hr GTT [ ]   . Amenorrhea   . Chest pain    Coronary CTA 04/2019: Calcium score 0; no evidence of CAD  . Chronic combined systolic and diastolic heart failure ([place]) 11/13/2013   LVEF improved from 25% to 45% after 6 months of antihypertensive therapy   . Colchicine overdose 05/29/2019  . Conductive hearing loss of left ear with unrestricted hearing of right ear 11/02/2017  . Congestive heart failure ([place]) 12/12/2019   Last Assessment & Plan:  Formatting of this note might be different from the original. No new symptoms. Is continued labetalol 200 mg BID with good effect.  . Daytime sleepiness 08/17/2016  . Depression   . Diverticulitis of large intestine 12/30/2015  . GERD (gastroesophageal reflux disease)    . Gestational diabetes mellitus (GDM) in second trimester 01/15/2020   Last Assessment & Plan:  Formatting of this note might be different from the original. Patient failed 1hr gtt at 175 and then failed 3 hr gtt. She has been trying medical nutrition therapy over the last few weeks with good effect although she does not have a log with her today. I discussed with Ms. Hogen whether these labs results indicated a diagnosis of gestational diabetes or preexisting diabe  . Gout   . Hepatic steatosis   . Hepatomegaly   . Hirsutism   . HTN (hypertension)   . Hypotension 08/20/2014  . Impacted cerumen of left ear 11/02/2017  . Infertility, female   . Migraine   . Morbid obesity ([facility]) 03/20/2013  . OSA on CPAP 02/21/2018   Home sleep study showed severe obstructive sleep apnea with an AHI of 62.3 on night 1 and 92.0 and night to with oxygen desaturations less than 70% for 1 minute.  A total of 1 hour and 22 minutes was noted with O2 sats less than 89%.  52% of the time was spent with oxygen saturations less than 90%. Now on CPAP at 14 cm H2O   . Polycystic ovary syndrome 03/20/2013  . Renal mass   . Snoring 08/17/2016  . STD (sexually transmitted disease)    HSV (genital)  . Systolic heart failure [facility])     Current Outpatient Medications on File Prior to Visit  Medication Sig Dispense Refill  . acetaminophen (TYLENOL) 500 MG tablet Take 500 mg by mouth every 6 (six) hours as needed for mild pain.    Marland Kitchen allopurinol (ZYLOPRIM) 100 MG tablet Take 100 mg by mouth daily.     Marland Kitchen amLODipine-benazepril (LOTREL) 5-40 MG capsule Take 1 capsule by mouth daily. 90 capsule 3  . aspirin EC 81 MG tablet Take 81 mg by mouth daily. Swallow whole.    Marland Kitchen aspirin-acetaminophen-caffeine (EXCEDRIN MIGRAINE) 250-250-65 MG tablet Take by mouth as needed for headache.    . carvedilol (COREG) 25 MG tablet Take 1 tablet (25 mg total) by mouth 2 (two) times daily. 180 tablet 3  . COLCHICINE PO Take by mouth as needed.     .  furosemide (LASIX) 40 MG tablet Take 1 tablet (40 mg total) by mouth daily. 90 tablet 3  . omeprazole (PRILOSEC) 40 MG capsule Take 40 mg by mouth as needed (heart burn/gerd).    . sertraline (ZOLOFT) 50 MG tablet Take 50 mg by mouth daily.     No current  facility-administered medications on file prior to visit.    Allergies  Allergen Reactions  . Fish Allergy Anaphylaxis, Hives and Swelling     Assessment/Plan:  1. Hypertension - Patient BP in room today 128/88 which is much improved but still slightly above goal of <130/80.  It is possible to increase her Lotrel to 10-40, however will wait until BMP results come back.  Added on A1c since patient has been overdue and may be diabetic. She is also concerned.   Counseled patient on the link between OSA and HTN and the importance of wearing CPAP device.  Patient voiced understanding.  Patient endorses depression and anxiety.  Recommended patient schedule an appointment with PCP. Since she does not want more medications, suggested she seek counseling referral.  Patient was receptive.    Will contact patient tomorrow with lab results and schedule follow up.  Possibly increase Lotrel at that time.  Karren Cobble, PharmD, BCACP, [facility] [address] [place], [place] [address] Phone: [phone]; Fax: [phone] 02/15/2020 3:40 PM

## 2020-02-15 NOTE — Patient Instructions (Addendum)
It was great meeting you today!  Your blood pressure today was 128/88, we would like to keep it less than 130/80  Continue to check your blood pressure at home 1-2 hours after your take your medications.  I will stay in touch to track your readings  Continue your carvedilol 25 mg twice daily, furosemide 40 mg daily and your amlodipine/benazapril 5-40 once daily  Remember to wear your CPAP machine  Please call with any questions  Karren Cobble, PharmD, [facility], [facility] [address], [place], [place] [address] Phone: [phone]; Fax: [phone] 02/15/2020 11:43 AM

## 2020-02-15 NOTE — Progress Notes (Signed)
Office Visit Note   Patient: Renee Roberson           Date of Birth: April 01, 1981           MRN: [id number] Visit Date: 02/15/2020              Requested by: Donald Prose, MD [address],  [place] [id number] PCP: Donald Prose, MD   Assessment & Plan: Visit Diagnoses:  1. De Quervain's disease (radial styloid tenosynovitis)   2. Elevated antinuclear antibody (ANA) level   3. 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  After sitting for a few minutes patient reported excellent relief of her pain radial wrist with anesthetic in place and her thumb range of motion also improved.  I will have patient follow-up with me in a couple weeks for recheck.  I discussed with her that if she did not have long-term improvement ultimately may come down her needing surgical intervention for her de Quervain's.  I did review lab work which showed elevated ANA and also uric acid.  I will refer her to rheumatology.  All questions answered.  I also did give patient a note taken her out of work over the next couple of days and she can return Monday.  Follow-Up Instructions: Return in about 2 weeks (around 02/29/2020) for with Spiro Ausborn recheck left hand.   Orders:  Orders Placed This Encounter  Procedures  . Ambulatory referral to Rheumatology   No orders of the defined types were placed in this encounter.     Procedures: Hand/UE Inj: L extensor compartment 1 for de Quervain's tenosynovitis on 02/15/2020 11:18 AM Indications: pain Details: 25 G needle, radial approach Medications: 0.3 mL lidocaine 1 %; 0.33 mL bupivacaine 0.25 %; 13.33 mg methylPREDNISolone acetate 40 MG/ML Outcome: tolerated well, no immediate complications Consent was given by the patient. Patient was prepped and draped in the usual sterile fashion.        Clinical Data: No additional findings.   Subjective: Chief Complaint  Patient presents with  . Left Wrist - Follow-up    HPI 39 year old black female returns.  She continues have ongoing left radial wrist pain from her de Quervain's.  She has been using the Voltaren gel and thumb spica splint.  Labs from February 08, 2020 showed ANA pattern I mitotic, intracellular bridge.  ANA titer 1:40.  ANA pattern nuclear, speckled.  Uric acid 8.1.  She is want to proceed with wrist injection that we discussed last office visit.  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  Objective: Vital Signs: LMP 08/22/2019 (Approximate)   Physical Exam Pleasant black female alert and oriented in no acute distress.  Again she is exquisitely tender over the first dorsal compartment.  Pain with resisted thumb abduction/extension.  Positive Finkelstein's test.  Continues have some swelling around her wrist. Ortho Exam  Specialty Comments:  No specialty comments available.  Imaging: No results found.   PMFS History: Patient Active Problem List   Diagnosis Date Noted  . Systolic heart failure ([facility] [facility])   . STD (sexually transmitted disease)   . Migraine   . Infertility, female   . Hirsutism   . Hepatomegaly   . Hepatic steatosis   . GERD (gastroesophageal reflux disease)   .  Depression   . Chest pain   . Amenorrhea   . Abnormal Pap smear of cervix   . Gestational diabetes mellitus (GDM) in second trimester 01/15/2020  . Advanced maternal age, 1st pregnancy 12/12/2019  . Congestive heart failure ([place]) 12/12/2019  . Gout 12/12/2019  . Renal mass 12/12/2019  . Colchicine overdose 05/29/2019  . OSA on CPAP 02/21/2018  . Conductive hearing loss of left ear with unrestricted hearing of right ear 11/02/2017  . Impacted cerumen of left ear 11/02/2017  . Snoring 08/17/2016  . Daytime sleepiness 08/17/2016  . Diverticulitis of large  intestine 12/30/2015  . Hypotension 08/20/2014  . Chronic combined systolic and diastolic heart failure ([place]) 11/13/2013  . HTN (hypertension) 03/20/2013    Class: Chronic  . Morbid obesity ([facility]) 03/20/2013  . Polycystic ovary syndrome 03/20/2013    Class: Chronic  . Accelerated essential hypertension 03/20/2013   Past Medical History:  Diagnosis Date  . Abnormal Pap smear of cervix   . Accelerated essential hypertension 03/20/2013   Left ventricular systolic dysfunction as a complication   . Advanced maternal age, 1st pregnancy 12/12/2019   Formatting of this note might be different from the original. Dating: 06/03/2020 by U/S                                             First trimester:  [ ]  Prenatal labs reviewed  [ ]  SMA/Hgb electrophoresis/CF   [ ]  Early 1 hr GTT*  [ ]  Baseline HELLP* [ ]  LDASA* [ ]  TB screening* [ ]  Medicaid patients: Pregnancy Medical Home Risk Screen (Code [phone])* Third trimester:  [ ]  CBC/RPR/HIV [ ]  1hr GTT [ ]   . Amenorrhea   . Chest pain    Coronary CTA 04/2019: Calcium score 0; no evidence of CAD  . Chronic combined systolic and diastolic heart failure ([place] [facility]) 11/13/2013   LVEF improved from 25% to 45% after 6 months of antihypertensive therapy   . Colchicine overdose 05/29/2019  . Conductive hearing loss of left ear with unrestricted hearing of right ear 11/02/2017  . Congestive heart failure ([facility] [facility]) 12/12/2019   Last Assessment & Plan:  Formatting of this note might be different from the original. No new symptoms. Is continued labetalol 200 mg BID with good effect.  . Daytime sleepiness 08/17/2016  . Depression   . Diverticulitis of large intestine 12/30/2015  . GERD (gastroesophageal reflux disease)   . Gestational diabetes mellitus (GDM) in second trimester 01/15/2020   Last Assessment & Plan:  Formatting of this note might be different from the original. Patient failed 1hr gtt at 175 and then failed 3 hr gtt. She has been trying medical nutrition therapy over the  last few weeks with good effect although she does not have a log with her today. I discussed with Ms. Gal whether these labs results indicated a diagnosis of gestational diabetes or preexisting diabe  . Gout   . Hepatic steatosis   . Hepatomegaly   . Hirsutism   . HTN (hypertension)   . Hypotension 08/20/2014  . Impacted cerumen of left ear 11/02/2017  . Infertility, female   . Migraine   . Morbid obesity ([place]) 03/20/2013  . OSA on CPAP 02/21/2018   Home sleep study showed severe obstructive sleep apnea with an AHI of 62.3 on night 1 and 92.0 and night to with oxygen desaturations less than  70% for 1 minute.  A total of 1 hour and 22 minutes was noted with O2 sats less than 89%.  52% of the time was spent with oxygen saturations less than 90%. Now on CPAP at 14 cm H2O   . Polycystic ovary syndrome 03/20/2013  . Renal mass   . Snoring 08/17/2016  . STD (sexually transmitted disease)    HSV (genital)  . Systolic heart failure ([facility])     Family History  Problem Relation Age of Onset  . Hypertension Mother   . Hypertension Father   . Thyroid disease Father   . Hypertension Sister   . Cancer Maternal Grandmother        ovarian or colon  . Diabetes Paternal Grandfather     Past Surgical History:  Procedure Laterality Date  . COLPOSCOPY  2015  . no surgical hx     Social History   Occupational History  . Not on file  Tobacco Use  . Smoking status: Never Smoker  . Smokeless tobacco: Never Used  Vaping Use  . Vaping Use: Never used  Substance and Sexual Activity  . Alcohol use: No    Alcohol/week: 0.0 standard drinks  . Drug use: No  . Sexual activity: Yes    Partners: Male

## 2020-02-16 ENCOUNTER — Telehealth: Payer: Self-pay | Admitting: Pharmacist

## 2020-02-16 ENCOUNTER — Encounter: Payer: Self-pay | Admitting: *Deleted

## 2020-02-16 LAB — HEMOGLOBIN A1C
Est. average glucose Bld gHb Est-mCnc: 126 mg/dL
Hgb A1c MFr Bld: 6 % — ABNORMAL HIGH (ref 4.8–5.6)

## 2020-02-16 NOTE — Telephone Encounter (Signed)
This encounter was created in error - please disregard.

## 2020-02-16 NOTE — Telephone Encounter (Signed)
Spoke with patient regarding lab results.  Patient was concerned whether she had DM. A1c was 6.0, advised this put her in the pre diabetic range and she should continue her diet and weight loss plans.    Patient still having pain from hand and steroid injection yesterday.  Reports orthopedic physicians called in prescription of tylenol with codeine for her and she is feeling better.  Blood pressure this morning was 143/94 which is elevated as compared to office visit yesterday, however patient reports she was in a great deal of pain at the time.  Would like to continue to monitor BP over the weekend before increasing amlodipine/benazapril.  Will contact on Monday.

## 2020-02-22 ENCOUNTER — Telehealth: Payer: Self-pay | Admitting: Pharmacist

## 2020-02-22 NOTE — Telephone Encounter (Signed)
LMOM for patient to check BP results

## 2020-02-27 NOTE — Telephone Encounter (Signed)
Spoke with patient.  Reports has been sick for last week.  Covid negative.  Has not been testing her BP regularly.  Before she was sick her BP was 126/79.  Most recently it was 155/87.  Also reports nausea from sertraline.  Advised patient to contact PCP to see about switching sertraline to a different antidepressant since she feels she needs it.  Also passed along message from [facility] that any return to work letter would need to come from PCP or OB.    Since patient's BP was at goal before she became ill, will not make any medication adjustments at this time.  Will contact patient in 1 week to see if BP has changed.

## 2020-02-29 ENCOUNTER — Ambulatory Visit: Payer: BC Managed Care – PPO | Admitting: Surgery

## 2020-03-10 NOTE — Progress Notes (Signed)
Cardiology Office Note:    Date:  03/15/2020   ID:  Renee Roberson, DOB 12-12-1980, MRN [id number]  PCP:  Donald Prose, MD  Cardiologist:  [name], MD   Referring MD: Donald Prose, MD   Chief Complaint  Patient presents with  . Congestive Heart Failure    History of Present Illness:    Renee Roberson is a 39 y.o. female with a hx of HTN, PCOS,obesity,chest pain,and chronic combined systolic and diastolic HF.A priorejection fraction was 25% in September 2014and improved to near normal with current LVEF 60 to 65% with mild LVH 11/2019.She continues to have diastolic dysfunction.  A recent hemoglobin A1c was 6.0 making her prediabetic.  She is back today for follow-up of therapy for chronic combined systolic and diastolic heart failure.  Most recent EF was documented to be normal.  She has been following in the pharmacy clinic.  She is asymptomatic.  Blood pressures have not been too low.  She denies shortness of breath.  She is concerned that allopurinol dose was increased.  I explained that furosemide being used will slightly increase her uric acid levels and predispose to gout.  Past Medical History:  Diagnosis Date  . Abnormal Pap smear of cervix   . Accelerated essential hypertension 03/20/2013   Left ventricular systolic dysfunction as a complication   . Advanced maternal age, 1st pregnancy 12/12/2019   Formatting of this note might be different from the original. Dating: 06/03/2020 by U/S                                             First trimester:  [ ]  Prenatal labs reviewed  [ ]  SMA/Hgb electrophoresis/CF   [ ]  Early 1 hr GTT*  [ ]  Baseline HELLP* [ ]  LDASA* [ ]  TB screening* [ ]  Medicaid patients: Pregnancy Medical Home Risk Screen (Code [phone])* Third trimester:  [ ]  CBC/RPR/HIV [ ]  1hr GTT [ ]   . Amenorrhea   . Chest pain    Coronary CTA 04/2019: Calcium score 0; no evidence of CAD  . Chronic combined systolic and diastolic heart failure ([place]) 11/13/2013    LVEF improved from 25% to 45% after 6 months of antihypertensive therapy   . Colchicine overdose 05/29/2019  . Conductive hearing loss of left ear with unrestricted hearing of right ear 11/02/2017  . Congestive heart failure ([place]) 12/12/2019   Last Assessment & Plan:  Formatting of this note might be different from the original. No new symptoms. Is continued labetalol 200 mg BID with good effect.  . Daytime sleepiness 08/17/2016  . Depression   . Diverticulitis of large intestine 12/30/2015  . GERD (gastroesophageal reflux disease)   . Gestational diabetes mellitus (GDM) in second trimester 01/15/2020   Last Assessment & Plan:  Formatting of this note might be different from the original. Patient failed 1hr gtt at 175 and then failed 3 hr gtt. She has been trying medical nutrition therapy over the last few weeks with good effect although she does not have a log with her today. I discussed with [name] whether these labs results indicated a diagnosis of gestational diabetes or preexisting diabe  . Gout   . Hepatic steatosis   . Hepatomegaly   . Hirsutism   . HTN (hypertension)   . Hypotension 08/20/2014  . Impacted cerumen of left ear 11/02/2017  .  Infertility, female   . Migraine   . Morbid obesity ([place]) 03/20/2013  . OSA on CPAP 02/21/2018   Home sleep study showed severe obstructive sleep apnea with an AHI of 62.3 on night 1 and 92.0 and night to with oxygen desaturations less than 70% for 1 minute.  A total of 1 hour and 22 minutes was noted with O2 sats less than 89%.  52% of the time was spent with oxygen saturations less than 90%. Now on CPAP at 14 cm H2O   . Polycystic ovary syndrome 03/20/2013  . Renal mass   . Snoring 08/17/2016  . STD (sexually transmitted disease)    HSV (genital)  . Systolic heart failure [facility])     Past Surgical History:  Procedure Laterality Date  . COLPOSCOPY  2015  . no surgical hx      Current Medications: Current Meds  Medication Sig  . acetaminophen  (TYLENOL) 500 MG tablet Take 500 mg by mouth every 6 (six) hours as needed for mild pain.  Marland Kitchen allopurinol (ZYLOPRIM) 100 MG tablet Take 2 tablets (200 mg total) by mouth daily.  Marland Kitchen amLODipine-benazepril (LOTREL) 5-40 MG capsule Take 1 capsule by mouth daily.  Marland Kitchen aspirin-acetaminophen-caffeine (EXCEDRIN MIGRAINE) 250-250-65 MG tablet Take by mouth as needed for headache.  . carvedilol (COREG) 25 MG tablet Take 1 tablet (25 mg total) by mouth 2 (two) times daily.  . COLCHICINE PO Take by mouth as needed.   . furosemide (LASIX) 40 MG tablet Take 1 tablet (40 mg total) by mouth daily.  Marland Kitchen omeprazole (PRILOSEC) 40 MG capsule Take 40 mg by mouth as needed (heart burn/gerd).  . [DISCONTINUED] aspirin EC 81 MG tablet Take 81 mg by mouth daily. Swallow whole.     Allergies:   Fish allergy and Iodine   Social History   Socioeconomic History  . Marital status: Single    Spouse name: Not on file  . Number of children: Not on file  . Years of education: Not on file  . Highest education level: Not on file  Occupational History  . Not on file  Tobacco Use  . Smoking status: Never Smoker  . Smokeless tobacco: Never Used  Vaping Use  . Vaping Use: Never used  Substance and Sexual Activity  . Alcohol use: No    Alcohol/week: 0.0 standard drinks  . Drug use: No  . Sexual activity: Yes    Partners: Male  Other Topics Concern  . Not on file  Social History Narrative  . Not on file   Social Determinants of Health   Financial Resource Strain:   . Difficulty of Paying Living Expenses: Not on file  Food Insecurity:   . Worried About Charity fundraiser in the Last Year: Not on file  . Ran Out of Food in the Last Year: Not on file  Transportation Needs:   . Lack of Transportation (Medical): Not on file  . Lack of Transportation (Non-Medical): Not on file  Physical Activity:   . Days of Exercise per Week: Not on file  . Minutes of Exercise per Session: Not on file  Stress:   . Feeling of Stress  : Not on file  Social Connections:   . Frequency of Communication with Friends and Family: Not on file  . Frequency of Social Gatherings with Friends and Family: Not on file  . Attends Religious Services: Not on file  . Active Member of Clubs or Organizations: Not on file  . Attends Club  or Organization Meetings: Not on file  . Marital Status: Not on file     Family History: The patient's family history includes Cancer in her maternal grandmother; Diabetes in her paternal grandfather; Hypertension in her father, mother, and sister; Thyroid disease in her father.  ROS:   Please see the history of present illness.    She is anxious about her medications and prognosis.  All other systems reviewed and are negative.  EKGs/Labs/Other Studies Reviewed:    The following studies were reviewed today: No new data  EKG:  EKG not repeated  Recent Labs: 05/30/2019: ALT 34 12/08/2019: NT-Pro BNP 8 02/08/2020: Hemoglobin 12.0; Platelets 283 02/15/2020: BUN 12; Creatinine, Ser 0.87; Potassium 3.9; Sodium 137  Recent Lipid Panel No results found for: CHOL, TRIG, HDL, CHOLHDL, VLDL, LDLCALC, LDLDIRECT  Physical Exam:    VS:  BP 118/72   Pulse 94   Ht 5\' 5"  (1.651 m)   Wt 227 lb 9.6 oz (103.2 kg)   LMP 08/22/2019 (Approximate)   SpO2 98%   BMI 37.87 kg/m     Wt Readings from Last 3 Encounters:  03/15/20 227 lb 9.6 oz (103.2 kg)  03/13/20 230 lb 9.6 oz (104.6 kg)  02/08/20 230 lb 3.2 oz (104.4 kg)     GEN: Morbid obesity. No acute distress HEENT: Normal NECK: No JVD. LYMPHATICS: No lymphadenopathy CARDIAC:  RRR without murmur, gallop, or edema. VASCULAR:  Normal Pulses. No bruits. RESPIRATORY:  Clear to auscultation without rales, wheezing or rhonchi  ABDOMEN: Soft, non-tender, non-distended, No pulsatile mass, MUSCULOSKELETAL: No deformity  SKIN: Warm and dry NEUROLOGIC:  Alert and oriented x 3 PSYCHIATRIC:  Normal affect   ASSESSMENT:    1. Chronic combined systolic and  diastolic heart failure ([place])   2. Hypertension, unspecified type   3. Prediabetes   4. Hypertensive heart disease with chronic systolic congestive heart failure ([place])   5. OSA on CPAP   6. Hyperlipidemia LDL goal <70   7. Polycystic ovary syndrome   8. Morbid obesity ([place])   9. Educated about COVID-19 virus infection    PLAN:    In order of problems listed above:  1. Resolved diastolic heart failure with EF greater than 60%.  Continue carvedilol 25 mg twice daily Lotrel 5/40 mg daily and furosemide. 2. Continue the same medicines listed above.  Low-salt diet discussed.  Avoid nonsteroidal anti-inflammatory therapy 3. Appraised of prediabetes and recommended decreased caloric intake and increase physical activity. 4. Resolved 5. Compliant with CPAP. 6. Lipids are a concern.  By age 68 we need to start statin or appropriate therapy to lower the LDL given her of the risk profile. 7. Followed by OB/GYN 8. Discussed weight loss. 68. Vaccinated and practicing mitigation.   Medication Adjustments/Labs and Tests Ordered: Current medicines are reviewed at length with the patient today.  Concerns regarding medicines are outlined above.  No orders of the defined types were placed in this encounter.  No orders of the defined types were placed in this encounter.   There are no Patient Instructions on file for this visit.   Signed, [name], MD  03/15/2020 2:09 PM    [facility]

## 2020-03-12 ENCOUNTER — Telehealth: Payer: Self-pay | Admitting: Pharmacist

## 2020-03-12 NOTE — Progress Notes (Signed)
Office Visit Note  Patient: Renee Roberson             Date of Birth: 06-04-1980           MRN: [id number]             PCP: Donald Prose, MD Referring: Lanae Crumbly, PA-C Visit Date: 03/13/2020   Subjective:  New Patient (Initial Visit) (Abnormal labs)   History of Present Illness: Renee Roberson is a 39 y.o. female here for evaluation of positive ANA with arthralgias and also gout. She has been experiencing pain especially in the back and in the right shoulder. She had a major stressor with pregnancy terminated earlier this year due to complications related to heart failure and gestational diabetes. Her joint pain is worsened with use and not associated with swelling or redness. She has noticed itchy rash around the base of her neck that bothers her more when in heat and sun. She does have some lower extremity swelling. She has a history of gout attacks in her right ankle with last attack early this year. She takes allopurinol 113m daily for this. She denies any alopecia, eye inflammation, pleurisy, raynaud's, or history of blood clots.  ANA 1:40 Uric acid 8.1 RF negative ESR 9  Activities of Daily Living:  Patient reports morning stiffness for 12 hours.   Patient Denies nocturnal pain.  Difficulty dressing/grooming: Denies Difficulty climbing stairs: Reports Difficulty getting out of chair: Denies Difficulty using hands for taps, buttons, cutlery, and/or writing: Denies  Review of Systems  Constitutional: Positive for fatigue.  HENT: Positive for mouth dryness.   Eyes: Negative for dryness.  Respiratory: Positive for shortness of breath.   Cardiovascular: Negative for swelling in legs/feet.  Gastrointestinal: Positive for constipation.  Endocrine: Positive for heat intolerance, excessive thirst and increased urination.  Genitourinary: Negative for difficulty urinating.  Musculoskeletal: Positive for arthralgias, gait problem, joint pain, morning stiffness and  muscle tenderness.  Skin: Positive for rash.  Allergic/Immunologic: Positive for susceptible to infections.  Neurological: Positive for numbness.  Hematological: Negative for bruising/bleeding tendency.  Psychiatric/Behavioral: Positive for sleep disturbance.    PMFS History:  Patient Active Problem List   Diagnosis Date Noted  . Positive ANA (antinuclear antibody) 03/13/2020  . Idiopathic chronic gout, unspecified site, without tophus (tophi) 03/13/2020  . Chronic right shoulder pain 03/13/2020  . [place] heart [place]   . STD (sexually transmitted disease)   . Migraine   . Infertility, female   . Hirsutism   . Hepatomegaly   . Hepatic steatosis   . GERD (gastroesophageal reflux disease)   . Depression   . Chest pain   . Amenorrhea   . Abnormal Pap smear of cervix   . Gestational diabetes mellitus (GDM) in second trimester 01/15/2020  . Advanced maternal age, 1st pregnancy 12/12/2019  . Congestive heart failure ([place] 12/12/2019  . Renal mass 12/12/2019  . Colchicine overdose 05/29/2019  . OSA on CPAP 02/21/2018  . Conductive hearing loss of left ear with unrestricted hearing of right ear 11/02/2017  . Impacted cerumen of left ear 11/02/2017  . Snoring 08/17/2016  . Daytime sleepiness 08/17/2016  . Diverticulitis of large intestine 12/30/2015  . Hypotension 08/20/2014  . Chronic combined systolic and diastolic heart failure ([place] 11/13/2013  . HTN (hypertension) 03/20/2013    Class: Chronic  . Morbid obesity ([place] 03/20/2013  . Polycystic ovary syndrome 03/20/2013    Class: Chronic  . Accelerated essential hypertension 03/20/2013    Past  Medical History:  Diagnosis Date  . Abnormal Pap smear of cervix   . Accelerated essential hypertension 03/20/2013   Left ventricular systolic dysfunction as a complication   . Advanced maternal age, 1st pregnancy 12/12/2019   Formatting of this note might be different from the original. Dating: 06/03/2020 by U/S                                              First trimester:  '[ ]'  Prenatal labs reviewed  '[ ]'  SMA/Hgb electrophoresis/CF   '[ ]'  Early 1 hr GTT*  '[ ]'  Baseline HELLP* '[ ]'  LDASA* '[ ]'  TB screening* '[ ]'  Medicaid patients: Pregnancy Medical Home Risk Screen (Code [phone])* Third trimester:  '[ ]'  CBC/RPR/HIV '[ ]'  1hr GTT '[ ]'   . Amenorrhea   . Chest pain    Coronary CTA 04/2019: Calcium score 0; no evidence of CAD  . Chronic combined systolic and diastolic heart failure ([place]) 11/13/2013   LVEF improved from 25% to 45% after 6 months of antihypertensive therapy   . Colchicine overdose 05/29/2019  . Conductive hearing loss of left ear with unrestricted hearing of right ear 11/02/2017  . Congestive heart failure ([place]) 12/12/2019   Last Assessment & Plan:  Formatting of this note might be different from the original. No new symptoms. Is continued labetalol 200 mg BID with good effect.  . Daytime sleepiness 08/17/2016  . Depression   . Diverticulitis of large intestine 12/30/2015  . GERD (gastroesophageal reflux disease)   . Gestational diabetes mellitus (GDM) in second trimester 01/15/2020   Last Assessment & Plan:  Formatting of this note might be different from the original. Patient failed 1hr gtt at 175 and then failed 3 hr gtt. She has been trying medical nutrition therapy over the last few weeks with good effect although she does not have a log with her today. I discussed with Ms. Gartland whether these labs results indicated a diagnosis of gestational diabetes or preexisting diabe  . Gout   . Hepatic steatosis   . Hepatomegaly   . Hirsutism   . HTN (hypertension)   . Hypotension 08/20/2014  . Impacted cerumen of left ear 11/02/2017  . Infertility, female   . Migraine   . Morbid obesity ([place]) 03/20/2013  . OSA on CPAP 02/21/2018   Home sleep study showed severe obstructive sleep apnea with an AHI of 62.3 on night 1 and 92.0 and night to with oxygen desaturations less than 70% for 1 minute.  A total of 1 hour and 22 minutes was  noted with O2 sats less than 89%.  52% of the time was spent with oxygen saturations less than 90%. Now on CPAP at 14 cm H2O   . Polycystic ovary syndrome 03/20/2013  . Renal mass   . Snoring 08/17/2016  . STD (sexually transmitted disease)    HSV (genital)  . Systolic heart failure ([facility])     Family History  Problem Relation Age of Onset  . Hypertension Mother   . Hypertension Father   . Thyroid disease Father   . Hypertension Sister   . Cancer Maternal Grandmother        ovarian or colon  . Diabetes Paternal Grandfather    Past Surgical History:  Procedure Laterality Date  . COLPOSCOPY  2015  . no surgical hx     Social History  Social History Narrative  . Not on file   Immunization History  Administered Date(s) Administered  . Influenza-Unspecified 04/09/2017  . PFIZER SARS-COV-2 Vaccination 08/11/2019, 08/31/2019  . Tdap 05/23/2012     Objective: Vital Signs: BP (!) 152/93 (BP Location: Right Arm, Patient Position: Sitting, Cuff Size: Normal)   Pulse 79   Resp 14   Ht '5\' 5"'  (1.651 m)   Wt 230 lb 9.6 oz (104.6 kg)   LMP 08/22/2019 (Approximate)   BMI 38.37 kg/m    Physical Exam HENT:     Right Ear: External ear normal.     Left Ear: External ear normal.     Mouth/Throat:     Mouth: Mucous membranes are moist.     Pharynx: Oropharynx is clear.  Eyes:     Conjunctiva/sclera: Conjunctivae normal.     Pupils: Pupils are equal, round, and reactive to light.  Cardiovascular:     Rate and Rhythm: Normal rate and regular rhythm.  Skin:    Comments: Flat, hyperpigmented skin changes around sides and back of neck base  Neurological:     General: No focal deficit present.      Musculoskeletal Exam: Neck full ROM Right shoulder tenderness to palpation over superior and lateral sides, pain with resisted abduction, strength intact, left shoulder normal Elbow, wrist, fingers full ROM no tenderness or swelling Knees, ankles, and feet full ROM no tenderness or  swelling  CDAI Exam: CDAI Score: -- Patient Global: --; Provider Global: -- Swollen: --; Tender: -- Joint Exam 03/13/2020   No joint exam has been documented for this visit   There is currently no information documented on the homunculus. Go to the Rheumatology activity and complete the homunculus joint exam.  Investigation: No additional findings.  Imaging: No results found.  Recent Labs: Lab Results  Component Value Date   WBC 4.9 02/08/2020   HGB 12.0 02/08/2020   PLT 283 02/08/2020   NA 137 02/15/2020   K 3.9 02/15/2020   CL 100 02/15/2020   CO2 22 02/15/2020   GLUCOSE 125 (H) 02/15/2020   BUN 12 02/15/2020   CREATININE 0.87 02/15/2020   BILITOT 0.7 05/30/2019   ALKPHOS 53 05/30/2019   AST 30 05/30/2019   ALT 34 05/30/2019   PROT 9.2 (H) 05/30/2019   ALBUMIN 4.5 05/30/2019   CALCIUM 9.2 02/15/2020   GFRAA 98 02/15/2020    Speciality Comments: No specialty comments available.  Procedures:  No procedures performed Allergies: Fish allergy and Iodine   Assessment / Plan:     Visit Diagnoses: Positive ANA (antinuclear antibody) - Plan: Anti-Smith antibody, Anti-DNA antibody, double-stranded, Lupus Anticoagulant Eval w/Reflex  Does not meet clinical criteria of SLE at this time. Will obtain more specific serologies. If seropositive will probably not start treatment at this time but would follow clinically. If negative effectively rule out SLE as cause at this time.  Idiopathic chronic gout of right foot without tophus - Plan: allopurinol (ZYLOPRIM) 100 MG tablet  Last uric acid 8.1 with normal CBC and metabolic panel. Last attack earlier this year, and she has had more frequent attacks in the past before starting treatment. Discussed uric acid goal of 6.0 to prevent flares and also possible contributor of hyperurecemia to cardiovascular risk and metabolic disorder. With normal renal and hepatic function will titrate up at 173m daily dose increments. Increase to  2072mdaily today and will repeat labs in 4 weeks.  Chronic right shoulder pain Symptoms are most consistent with rotator cuff pathology.  Discussed ROM and stretching exercises to perform at home for this. Can consider medical management if not improving.  Orders: Orders Placed This Encounter  Procedures  . Anti-Smith antibody  . Anti-DNA antibody, double-stranded  . Lupus Anticoagulant Eval w/Reflex   Meds ordered this encounter  Medications  . allopurinol (ZYLOPRIM) 100 MG tablet    Sig: Take 2 tablets (200 mg total) by mouth daily.    Dispense:  60 tablet    Refill:  0     Follow-Up Instructions: Return in about 4 weeks (around 04/10/2020) for Allopurinol titration.   Collier Salina, MD  Note - This record has been created using [employer].  Chart creation errors have been sought, but may not always  have been located. Such creation errors do not reflect on  the standard of medical care.

## 2020-03-12 NOTE — Telephone Encounter (Signed)
Left message on machine checking on BP results

## 2020-03-13 ENCOUNTER — Other Ambulatory Visit: Payer: Self-pay

## 2020-03-13 ENCOUNTER — Ambulatory Visit (INDEPENDENT_AMBULATORY_CARE_PROVIDER_SITE_OTHER): Payer: BC Managed Care – PPO | Admitting: Internal Medicine

## 2020-03-13 ENCOUNTER — Encounter: Payer: Self-pay | Admitting: Internal Medicine

## 2020-03-13 VITALS — BP 152/93 | HR 79 | Resp 14 | Ht 65.0 in | Wt 230.6 lb

## 2020-03-13 DIAGNOSIS — M1A00X0 Idiopathic chronic gout, unspecified site, without tophus (tophi): Secondary | ICD-10-CM | POA: Insufficient documentation

## 2020-03-13 DIAGNOSIS — G8929 Other chronic pain: Secondary | ICD-10-CM | POA: Diagnosis not present

## 2020-03-13 DIAGNOSIS — R768 Other specified abnormal immunological findings in serum: Secondary | ICD-10-CM | POA: Diagnosis not present

## 2020-03-13 DIAGNOSIS — M1A0710 Idiopathic chronic gout, right ankle and foot, without tophus (tophi): Secondary | ICD-10-CM

## 2020-03-13 DIAGNOSIS — M25511 Pain in right shoulder: Secondary | ICD-10-CM | POA: Diagnosis not present

## 2020-03-13 MED ORDER — ALLOPURINOL 100 MG PO TABS: 200.0000 mg | ORAL_TABLET | Freq: Every day | ORAL | 0 refills | Status: DC

## 2020-03-13 NOTE — Patient Instructions (Signed)
I recommend increasing your allopurinol dose to 200mg  (2 tablets) daily. We will recheck lab tests for response to this change in 4 weeks.  I am also checking for lab tests more specific to lupus. I do not recommend any new medication to treat for this at this time if they are positive we will just monitor for now.      Allopurinol tablets What is this medicine? ALLOPURINOL (al oh PURE i nole) reduces the amount of uric acid the body makes. It is used to treat the symptoms of gout. It is also used to treat or prevent high uric acid levels that occur as a result of certain types of chemotherapy. This medicine may also help patients who frequently have kidney stones. This medicine may be used for other purposes; ask your health care provider or pharmacist if you have questions. COMMON BRAND NAME(S): Zyloprim What should I tell my health care provider before I take this medicine? They need to know if you have any of these conditions:  kidney disease  liver disease  an unusual or allergic reaction to allopurinol, other medicines, foods, dyes, or preservatives  pregnant or trying to get pregnant  breast feeding How should I use this medicine? Take this medicine by mouth with a glass of water. Follow the directions on the prescription label. If this medicine upsets your stomach, take it with food or milk. Take your doses at regular intervals. Do not take your medicine more often than directed. Talk to your pediatrician regarding the use of this medicine in children. Special care may be needed. While this drug may be prescribed for children as young as 6 years for selected conditions, precautions do apply. Overdosage: If you think you have taken too much of this medicine contact a poison control center or emergency room at once. NOTE: This medicine is only for you. Do not share this medicine with others. What if I miss a dose? If you miss a dose, take it as soon as you can. If it is almost  time for your next dose, take only that dose. Do not take double or extra doses. What may interact with this medicine? Do not take this medicine with the following medication:  didanosine, ddI This medicine may also interact with the following medications:  certain antibiotics like amoxicillin, ampicillin  certain medicines for cancer  certain medicines for immunosuppression like azathioprine, cyclosporine, mercaptopurine  chlorpropamide  probenecid  thiazide diuretics, like hydrochlorothiazide  sulfinpyrazone  warfarin This list may not describe all possible interactions. Give your health care provider a list of all the medicines, herbs, non-prescription drugs, or dietary supplements you use. Also tell them if you smoke, drink alcohol, or use illegal drugs. Some items may interact with your medicine. What should I watch for while using this medicine? Visit your doctor or healthcare provider for regular checks on your progress. If you are taking this medicine to treat gout, you may not have less frequent attacks at first. Keep taking your medicine regularly and the attacks should get better within 2 to 6 weeks. Drink plenty of water (10 to 12 full glasses a day) while you are taking this medicine. This will help to reduce stomach upset and reduce the risk of getting gout or kidney stones. Call your doctor or healthcare provider at once if you get a skin rash together with chills, fever, sore throat, or nausea and vomiting, if you have blood in your urine, or difficulty passing urine. This medicine may cause  serious skin reactions. They can happen weeks to months after starting the medicine. Contact your healthcare provider right away if you notice fevers or flu-like symptoms with a rash. The rash may be red or purple and then turn into blisters or peeling of the skin. Or, you might notice a red rash with swelling of the face, lips or lymph nodes in your neck or under your arms. Do not take  vitamin C without asking your doctor or healthcare provider. Too much vitamin C can increase the chance of getting kidney stones. You may get drowsy or dizzy. Do not drive, use machinery, or do anything that needs mental alertness until you know how this drug affects you. Do not stand or sit up quickly, especially if you are an older patient. This reduces the risk of dizzy or fainting spells. Alcohol can make you more drowsy and dizzy. Alcohol can also increase the chance of stomach problems and increase the amount of uric acid in your blood. Avoid alcoholic drinks. What side effects may I notice from receiving this medicine? Side effects that you should report to your doctor or health care professional as soon as possible:  allergic reactions like skin rash, itching or hives, swelling of the face, lips, or tongue  breathing problems  joint pain  muscle pain  rash, fever, and swollen lymph nodes  redness, blistering, peeling, or loosening of the skin, including inside the mouth  signs and symptoms of infection like fever or chills; cough; sore throat  signs and symptoms of kidney injury like trouble passing urine or change in the amount of urine, flank pain  tingling, numbness in the hands or feet  unusual bleeding or bruising  unusually weak or tired Side effects that usually do not require medical attention (report to your doctor or health care professional if they continue or are bothersome):  changes in taste  diarrhea  drowsiness  headache  nausea, vomiting  stomach upset This list may not describe all possible side effects. Call your doctor for medical advice about side effects. You may report side effects to FDA at 1-800-FDA-1088. Where should I keep my medicine? Keep out of the reach of children. Store at room temperature between 15 and 25 degrees C (59 and 77 degrees F). Protect from light and moisture. Throw away any unused medicine after the expiration  date. NOTE: This sheet is a summary. It may not cover all possible information. If you have questions about this medicine, talk to your doctor, pharmacist, or health care provider.  2020 Elsevier/Gold Standard (2018-08-09 09:41:46)

## 2020-03-15 ENCOUNTER — Ambulatory Visit (INDEPENDENT_AMBULATORY_CARE_PROVIDER_SITE_OTHER): Payer: BC Managed Care – PPO | Admitting: Interventional Cardiology

## 2020-03-15 ENCOUNTER — Other Ambulatory Visit: Payer: Self-pay

## 2020-03-15 ENCOUNTER — Encounter: Payer: Self-pay | Admitting: Interventional Cardiology

## 2020-03-15 VITALS — BP 118/72 | HR 94 | Ht 65.0 in | Wt 227.6 lb

## 2020-03-15 DIAGNOSIS — I110 Hypertensive heart disease with heart failure: Secondary | ICD-10-CM

## 2020-03-15 DIAGNOSIS — Z7189 Other specified counseling: Secondary | ICD-10-CM

## 2020-03-15 DIAGNOSIS — E785 Hyperlipidemia, unspecified: Secondary | ICD-10-CM

## 2020-03-15 DIAGNOSIS — R7303 Prediabetes: Secondary | ICD-10-CM

## 2020-03-15 DIAGNOSIS — I5042 Chronic combined systolic (congestive) and diastolic (congestive) heart failure: Secondary | ICD-10-CM | POA: Diagnosis not present

## 2020-03-15 DIAGNOSIS — G4733 Obstructive sleep apnea (adult) (pediatric): Secondary | ICD-10-CM

## 2020-03-15 DIAGNOSIS — I10 Essential (primary) hypertension: Secondary | ICD-10-CM | POA: Diagnosis not present

## 2020-03-15 DIAGNOSIS — E282 Polycystic ovarian syndrome: Secondary | ICD-10-CM

## 2020-03-15 DIAGNOSIS — I5022 Chronic systolic (congestive) heart failure: Secondary | ICD-10-CM

## 2020-03-15 DIAGNOSIS — Z9989 Dependence on other enabling machines and devices: Secondary | ICD-10-CM

## 2020-03-15 NOTE — Patient Instructions (Signed)
Medication Instructions:  1) DISCONTINUE Aspirin  *If you need a refill on your cardiac medications before your next appointment, please call your pharmacy*   Lab Work: None If you have labs (blood work) drawn today and your tests are completely normal, you will receive your results only by: Marland Kitchen MyChart Message (if you have MyChart) OR . A paper copy in the mail If you have any lab test that is abnormal or we need to change your treatment, we will call you to review the results.   Testing/Procedures: None   Follow-Up: At [facility], you and your health needs are our priority.  As part of our continuing mission to provide you with exceptional heart care, we have created designated Provider Care Teams.  These Care Teams include your primary Cardiologist (physician) and Advanced Practice Providers (APPs -  Physician Assistants and Nurse Practitioners) who all work together to provide you with the care you need, when you need it.  We recommend signing up for the patient portal called "MyChart".  Sign up information is provided on this After Visit Summary.  MyChart is used to connect with patients for Virtual Visits (Telemedicine).  Patients are able to view lab/test results, encounter notes, upcoming appointments, etc.  Non-urgent messages can be sent to your provider as well.   To learn more about what you can do with MyChart, go to [email or web address].    Your next appointment:   6 month(s)  The format for your next appointment:   In Person  Provider:   You may see [name], MD or one of the following Advanced Practice Providers on your designated Care Team:    Truitt Merle, NP  Cecilie Kicks, NP  Kathyrn Drown, NP    Other Instructions

## 2020-03-16 ENCOUNTER — Other Ambulatory Visit: Payer: Self-pay | Admitting: Physician Assistant

## 2020-03-16 ENCOUNTER — Other Ambulatory Visit: Payer: Self-pay | Admitting: Internal Medicine

## 2020-03-16 DIAGNOSIS — M1A0710 Idiopathic chronic gout, right ankle and foot, without tophus (tophi): Secondary | ICD-10-CM

## 2020-03-18 LAB — LUPUS ANTICOAGULANT EVAL W/ REFLEX
PTT-LA Screen: 36 s (ref <=40)
dRVVT: 46 s — ABNORMAL HIGH (ref <=45)

## 2020-03-18 LAB — RFX DRVVT 1:1 MIX

## 2020-03-18 LAB — ANTI-SMITH ANTIBODY: ENA SM Ab Ser-aCnc: <1 AI

## 2020-03-18 LAB — RFLX DRVVT CONFRIM: DRVVT CONFIRM: POSITIVE — AB

## 2020-03-18 LAB — ANTI-DNA ANTIBODY, DOUBLE-STRANDED: ds DNA Ab: <1 IU/mL

## 2020-03-20 ENCOUNTER — Encounter (HOSPITAL_COMMUNITY): Payer: Self-pay | Admitting: Emergency Medicine

## 2020-03-20 ENCOUNTER — Emergency Department (HOSPITAL_COMMUNITY): Payer: BC Managed Care – PPO

## 2020-03-20 ENCOUNTER — Observation Stay (HOSPITAL_COMMUNITY)
Admission: EM | Admit: 2020-03-20 | Discharge: 2020-03-21 | Disposition: A | Discharge status: Home / Self Care | Payer: BC Managed Care – PPO | Attending: Internal Medicine | Admitting: Internal Medicine

## 2020-03-20 ENCOUNTER — Telehealth: Payer: Self-pay | Admitting: Physician Assistant

## 2020-03-20 DIAGNOSIS — F419 Anxiety disorder, unspecified: Secondary | ICD-10-CM

## 2020-03-20 DIAGNOSIS — R0789 Other chest pain: Secondary | ICD-10-CM | POA: Diagnosis not present

## 2020-03-20 DIAGNOSIS — R072 Precordial pain: Principal | ICD-10-CM | POA: Insufficient documentation

## 2020-03-20 DIAGNOSIS — R42 Dizziness and giddiness: Secondary | ICD-10-CM | POA: Diagnosis not present

## 2020-03-20 DIAGNOSIS — I5042 Chronic combined systolic (congestive) and diastolic (congestive) heart failure: Secondary | ICD-10-CM | POA: Diagnosis not present

## 2020-03-20 DIAGNOSIS — R079 Chest pain, unspecified: Secondary | ICD-10-CM | POA: Diagnosis present

## 2020-03-20 DIAGNOSIS — I110 Hypertensive heart disease with heart failure: Secondary | ICD-10-CM | POA: Insufficient documentation

## 2020-03-20 DIAGNOSIS — I5032 Chronic diastolic (congestive) heart failure: Secondary | ICD-10-CM | POA: Diagnosis not present

## 2020-03-20 DIAGNOSIS — Z79899 Other long term (current) drug therapy: Secondary | ICD-10-CM | POA: Insufficient documentation

## 2020-03-20 DIAGNOSIS — I10 Essential (primary) hypertension: Secondary | ICD-10-CM | POA: Diagnosis not present

## 2020-03-20 DIAGNOSIS — E119 Type 2 diabetes mellitus without complications: Secondary | ICD-10-CM | POA: Diagnosis not present

## 2020-03-20 DIAGNOSIS — E282 Polycystic ovarian syndrome: Secondary | ICD-10-CM | POA: Diagnosis present

## 2020-03-20 DIAGNOSIS — Z9989 Dependence on other enabling machines and devices: Secondary | ICD-10-CM

## 2020-03-20 DIAGNOSIS — F32A Depression, unspecified: Secondary | ICD-10-CM

## 2020-03-20 DIAGNOSIS — K219 Gastro-esophageal reflux disease without esophagitis: Secondary | ICD-10-CM | POA: Diagnosis present

## 2020-03-20 DIAGNOSIS — Z7982 Long term (current) use of aspirin: Secondary | ICD-10-CM | POA: Insufficient documentation

## 2020-03-20 DIAGNOSIS — Z20822 Contact with and (suspected) exposure to covid-19: Secondary | ICD-10-CM | POA: Diagnosis not present

## 2020-03-20 DIAGNOSIS — R55 Syncope and collapse: Secondary | ICD-10-CM | POA: Diagnosis not present

## 2020-03-20 DIAGNOSIS — R739 Hyperglycemia, unspecified: Secondary | ICD-10-CM

## 2020-03-20 DIAGNOSIS — G4733 Obstructive sleep apnea (adult) (pediatric): Secondary | ICD-10-CM

## 2020-03-20 DIAGNOSIS — R519 Headache, unspecified: Secondary | ICD-10-CM | POA: Diagnosis not present

## 2020-03-20 LAB — CBC
HCT: 37.9 % (ref 36.0–46.0)
HCT: 37.9 % (ref 36.0–46.0)
Hemoglobin: 12.2 g/dL (ref 12.0–15.0)
Hemoglobin: 12 g/dL (ref 12.0–15.0)
MCH: 28.3 pg (ref 26.0–34.0)
MCH: 27.8 pg (ref 26.0–34.0)
MCHC: 32.2 g/dL (ref 30.0–36.0)
MCHC: 31.7 g/dL (ref 30.0–36.0)
MCV: 87.9 fL (ref 80.0–100.0)
MCV: 87.9 fL (ref 80.0–100.0)
Platelets: 278 10*3/uL (ref 150–400)
Platelets: 271 10*3/uL (ref 150–400)
RBC: 4.31 MIL/uL (ref 3.87–5.11)
RBC: 4.31 MIL/uL (ref 3.87–5.11)
RDW: 14.1 % (ref 11.5–15.5)
RDW: 14.1 % (ref 11.5–15.5)
WBC: 5.4 10*3/uL (ref 4.0–10.5)
WBC: 5.2 10*3/uL (ref 4.0–10.5)
nRBC: 0 % (ref 0.0–0.2)
nRBC: 0 % (ref 0.0–0.2)

## 2020-03-20 LAB — CREATININE, SERUM
Creatinine, Ser: 0.75 mg/dL (ref 0.44–1.00)
GFR, Estimated: >60 mL/min (ref >60)

## 2020-03-20 LAB — TROPONIN I (HIGH SENSITIVITY)
Troponin I (High Sensitivity): <2 ng/L (ref <18)
Troponin I (High Sensitivity): 3 ng/L (ref <18)
Troponin I (High Sensitivity): 3 ng/L (ref <18)
Troponin I (High Sensitivity): 2 ng/L (ref <18)

## 2020-03-20 LAB — RESPIRATORY PANEL BY RT PCR (FLU A&B, COVID)
Influenza A by PCR: NEGATIVE
Influenza B by PCR: NEGATIVE
SARS Coronavirus 2 by RT PCR: NEGATIVE

## 2020-03-20 LAB — MAGNESIUM: Magnesium: 1.7 mg/dL (ref 1.7–2.4)

## 2020-03-20 LAB — BRAIN NATRIURETIC PEPTIDE: B Natriuretic Peptide: 14.4 pg/mL (ref 0.0–100.0)

## 2020-03-20 LAB — BASIC METABOLIC PANEL
Anion gap: 11 (ref 5–15)
BUN: 10 mg/dL (ref 6–20)
CO2: 22 mmol/L (ref 22–32)
Calcium: 9.4 mg/dL (ref 8.9–10.3)
Chloride: 104 mmol/L (ref 98–111)
Creatinine, Ser: 0.83 mg/dL (ref 0.44–1.00)
GFR, Estimated: >60 mL/min (ref >60)
Glucose, Bld: 104 mg/dL — ABNORMAL HIGH (ref 70–99)
Potassium: 4 mmol/L (ref 3.5–5.1)
Sodium: 137 mmol/L (ref 135–145)

## 2020-03-20 LAB — I-STAT BETA HCG BLOOD, ED (MC, WL, AP ONLY): I-stat hCG, quantitative: <5 m[IU]/mL (ref <5)

## 2020-03-20 LAB — TSH: TSH: 1.328 u[IU]/mL (ref 0.350–4.500)

## 2020-03-20 LAB — HIV ANTIBODY (ROUTINE TESTING W REFLEX): HIV Screen 4th Generation wRfx: NONREACTIVE

## 2020-03-20 LAB — PHOSPHORUS: Phosphorus: 4.5 mg/dL (ref 2.5–4.6)

## 2020-03-20 LAB — HCG, SERUM, QUALITATIVE: Preg, Serum: NEGATIVE

## 2020-03-20 MED ORDER — ACETAMINOPHEN 325 MG PO TABS
650.0000 mg | ORAL_TABLET | Freq: Four times a day (QID) | ORAL | Status: DC | PRN
  Filled 2020-03-20: qty 2

## 2020-03-20 MED ORDER — ACETAMINOPHEN 500 MG PO TABS
1000.0000 mg | ORAL_TABLET | Freq: Once | ORAL | Status: AC
  Administered 2020-03-20: 1000 mg via ORAL
  Filled 2020-03-20: qty 2

## 2020-03-20 MED ORDER — MORPHINE SULFATE (PF) 2 MG/ML IV SOLN: 2.0000 mg | INTRAVENOUS | Status: DC | PRN

## 2020-03-20 MED ORDER — ONDANSETRON HCL 4 MG PO TABS: 4.0000 mg | ORAL_TABLET | Freq: Four times a day (QID) | ORAL | Status: DC | PRN

## 2020-03-20 MED ORDER — MELATONIN 5 MG PO TABS
5.0000 mg | ORAL_TABLET | Freq: Every evening | ORAL | Status: DC | PRN
  Filled 2020-03-20: qty 1

## 2020-03-20 MED ORDER — AMLODIPINE BESY-BENAZEPRIL HCL 5-40 MG PO CAPS: 1.0000 | ORAL_CAPSULE | Freq: Every day | ORAL | Status: DC

## 2020-03-20 MED ORDER — BENAZEPRIL HCL 40 MG PO TABS
40.0000 mg | ORAL_TABLET | Freq: Every day | ORAL | Status: DC
  Administered 2020-03-20 – 2020-03-21 (×2): 40 mg via ORAL
  Filled 2020-03-20 (×2): qty 1

## 2020-03-20 MED ORDER — CARVEDILOL 25 MG PO TABS
25.0000 mg | ORAL_TABLET | Freq: Two times a day (BID) | ORAL | Status: DC
  Administered 2020-03-20 – 2020-03-21 (×3): 25 mg via ORAL
  Filled 2020-03-20 (×2): qty 1
  Filled 2020-03-20: qty 8

## 2020-03-20 MED ORDER — AMLODIPINE BESYLATE 5 MG PO TABS
5.0000 mg | ORAL_TABLET | Freq: Every day | ORAL | Status: DC
  Administered 2020-03-20 – 2020-03-21 (×2): 5 mg via ORAL
  Filled 2020-03-20 (×2): qty 1

## 2020-03-20 MED ORDER — ALLOPURINOL 100 MG PO TABS
200.0000 mg | ORAL_TABLET | Freq: Every day | ORAL | Status: DC
  Administered 2020-03-20 – 2020-03-21 (×2): 200 mg via ORAL
  Filled 2020-03-20 (×2): qty 2

## 2020-03-20 MED ORDER — NITROGLYCERIN 0.4 MG SL SUBL
0.4000 mg | SUBLINGUAL_TABLET | SUBLINGUAL | Status: DC | PRN
  Filled 2020-03-20: qty 1

## 2020-03-20 MED ORDER — MORPHINE SULFATE (PF) 2 MG/ML IV SOLN
2.0000 mg | Freq: Once | INTRAVENOUS | Status: AC
  Administered 2020-03-20: 2 mg via INTRAVENOUS
  Filled 2020-03-20: qty 1

## 2020-03-20 MED ORDER — ACETAMINOPHEN 650 MG RE SUPP: 650.0000 mg | Freq: Four times a day (QID) | RECTAL | Status: DC | PRN

## 2020-03-20 MED ORDER — ASPIRIN-ACETAMINOPHEN-CAFFEINE 250-250-65 MG PO TABS
2.0000 | ORAL_TABLET | ORAL | Status: DC | PRN
  Administered 2020-03-20: 2 via ORAL
  Filled 2020-03-20 (×3): qty 2

## 2020-03-20 MED ORDER — ENOXAPARIN SODIUM 40 MG/0.4ML ~~LOC~~ SOLN: 40.0000 mg | SUBCUTANEOUS | Status: DC

## 2020-03-20 MED ORDER — FUROSEMIDE 40 MG PO TABS
40.0000 mg | ORAL_TABLET | Freq: Every day | ORAL | Status: DC
  Administered 2020-03-20 – 2020-03-21 (×2): 40 mg via ORAL
  Filled 2020-03-20: qty 1
  Filled 2020-03-20: qty 2

## 2020-03-20 MED ORDER — ONDANSETRON HCL 4 MG/2ML IJ SOLN: 4.0000 mg | Freq: Four times a day (QID) | INTRAMUSCULAR | Status: DC | PRN

## 2020-03-20 MED ORDER — ASPIRIN EC 81 MG PO TBEC: 324.0000 mg | DELAYED_RELEASE_TABLET | Freq: Once | ORAL | Status: DC

## 2020-03-20 MED ORDER — SODIUM CHLORIDE 0.9% FLUSH
3.0000 mL | Freq: Two times a day (BID) | INTRAVENOUS | Status: DC
  Administered 2020-03-20 – 2020-03-21 (×2): 3 mL via INTRAVENOUS

## 2020-03-20 NOTE — Progress Notes (Signed)
Received pt from ED. Pt calm GCS 15, denies cp sob or dizziness. t mother at bedside.

## 2020-03-20 NOTE — ED Notes (Signed)
Patient offered nitro for chest pain, discussed implications of nitro administration with patient. She advised she would like to hold off and see if the tylenol helps her pain prior to taking nitro.

## 2020-03-20 NOTE — ED Notes (Signed)
Family at bedside. 

## 2020-03-20 NOTE — ED Triage Notes (Signed)
Pt arrives via [facility] from home for c/o chest pain, onset at 3am when chest pain awoke her from sleep. Pt states she got up to the restroom and had a syncopal episode. Pt hypertensive upon EMS arrival. She reports increase in stress recently. Pt received 324mg  asa. Hx of HTN and CHF. A/ox4, resp e/u.

## 2020-03-20 NOTE — H&P (Signed)
History and Physical    Renee Roberson UXN:[id number] DOB: [birth year] DOA: 03/20/2020  PCP: Donald Prose, MD  Patient coming from: Home  I have personally briefly reviewed patient's old medical records in [place]  Chief Complaint: Syncope and chest pain  HPI: Renee Roberson is a 39 y.o. female with medical history significant of PCOS, obstructive sleep apnea on CPAP, morbid obesity, hypertension, anxiety, chronic combined systolic and diastolic CHF-echo from 07/5425 showed improvement in ejection fraction from 25% to 60 to 65% with grade 1 diastolic dysfunction presents to emergency department with chest pain and syncope.  Patient tells me that she started having severe chest pain/chest tightness, midsternal, nonradiating, no aggravating or relieving factors, 10 out of 10, associated with mild shortness of breath that was followed by a syncopal episode.  Reports that she felt dizzy prior to syncopal episode and woke up on the floor.  She has history of migraine and reports posterior headache when she woke up.  She denies seizures, head trauma, chest pain, palpitation, leg swelling, orthopnea, PND, nausea, vomiting, abdominal pain, urinary or bowel changes.  She tells me that she is stressed out due to recent miscarriage(first pregnancy) in August 2021.  She was tearful during the encounter and tells me that she has tried Zoloft in the past which did not helped her and made her sick therefore she stopped taking it.  She denies active suicidal or homicidal thoughts.  She lives with her boyfriend at home.  No history of smoking, alcohol, street drug use.  She is compliant with CPAP at nighttime.  She is followed by cardiology Dr. Daneen Schick and had coronary CTA November 2020 with no evidence of coronary artery disease.  She is fully vaccinated against COVID-19.  ED Course: Upon arrival to ED: Patient blood pressure was noted to be in 140s over 80s, afebrile, no leukocytosis,  maintaining oxygen saturation on room air.  Initial labs such as CBC, BMP, troponin, BNP: WNL, COVID-19 pending.  Chest x-ray and CT head negative for acute findings.  Patient was given nitro and morphine in ED.  Her pain improved significantly.  [facility] hospitalist consulted for further evaluation of syncope/chest pain.  Review of Systems: As per HPI otherwise negative.    Past Medical History:  Diagnosis Date  . Abnormal Pap smear of cervix   . Accelerated essential hypertension 03/20/2013   Left ventricular systolic dysfunction as a complication   . Advanced maternal age, 1st pregnancy 12/12/2019   Formatting of this note might be different from the original. Dating: 06/03/2020 by U/S                                             First trimester:  [ ]  Prenatal labs reviewed  [ ]  SMA/Hgb electrophoresis/CF   [ ]  Early 1 hr GTT*  [ ]  Baseline HELLP* [ ]  LDASA* [ ]  TB screening* [ ]  Medicaid patients: Pregnancy Medical Home Risk Screen (Code [phone])* Third trimester:  [ ]  CBC/RPR/HIV [ ]  1hr GTT [ ]   . Amenorrhea   . Chest pain    Coronary CTA 04/2019: Calcium score 0; no evidence of CAD  . Chronic combined systolic and diastolic heart failure ([place]) 11/13/2013   LVEF improved from 25% to 45% after 6 months of antihypertensive therapy   . Colchicine overdose 05/29/2019  . Conductive hearing loss of left  ear with unrestricted hearing of right ear 11/02/2017  . Congestive heart failure ([place]) 12/12/2019   Last Assessment & Plan:  Formatting of this note might be different from the original. No new symptoms. Is continued labetalol 200 mg BID with good effect.  . Daytime sleepiness 08/17/2016  . Depression   . Diverticulitis of large intestine 12/30/2015  . GERD (gastroesophageal reflux disease)   . Gestational diabetes mellitus (GDM) in second trimester 01/15/2020   Last Assessment & Plan:  Formatting of this note might be different from the original. Patient failed 1hr gtt at 175 and then failed 3 hr gtt. She  has been trying medical nutrition therapy over the last few weeks with good effect although she does not have a log with her today. I discussed with Ms. Glasser whether these labs results indicated a diagnosis of gestational diabetes or preexisting diabe  . Gout   . Hepatic steatosis   . Hepatomegaly   . Hirsutism   . HTN (hypertension)   . Hypotension 08/20/2014  . Impacted cerumen of left ear 11/02/2017  . Infertility, female   . Migraine   . Morbid obesity ([place]) 03/20/2013  . OSA on CPAP 02/21/2018   Home sleep study showed severe obstructive sleep apnea with an AHI of 62.3 on night 1 and 92.0 and night to with oxygen desaturations less than 70% for 1 minute.  A total of 1 hour and 22 minutes was noted with O2 sats less than 89%.  52% of the time was spent with oxygen saturations less than 90%. Now on CPAP at 14 cm H2O   . Polycystic ovary syndrome 03/20/2013  . Renal mass   . Snoring 08/17/2016  . STD (sexually transmitted disease)    HSV (genital)  . Systolic heart failure [facility])     Past Surgical History:  Procedure Laterality Date  . COLPOSCOPY  2015  . no surgical hx       reports that she has never smoked. She has never used smokeless tobacco. She reports that she does not drink alcohol and does not use drugs.  Allergies  Allergen Reactions  . Fish Allergy Anaphylaxis, Hives and Swelling  . Iodine     Family History  Problem Relation Age of Onset  . Hypertension Mother   . Hypertension Father   . Thyroid disease Father   . Hypertension Sister   . Cancer Maternal Grandmother        ovarian or colon  . Diabetes Paternal Grandfather     Prior to Admission medications   Medication Sig Start Date End Date Taking? Authorizing Provider  allopurinol (ZYLOPRIM) 100 MG tablet TAKE 2 TABLETS(200 MG) BY MOUTH DAILY Patient taking differently: Take 200 mg by mouth daily.  03/16/20  Yes Rice, Resa Miner, MD  amLODipine-benazepril (LOTREL) 5-40 MG capsule Take 1 capsule by  mouth daily. 01/30/20  Yes Belva Crome, MD  aspirin-acetaminophen-caffeine (EXCEDRIN MIGRAINE) [phone] MG tablet Take 2 tablets by mouth as needed for headache.    Yes [provider]  carvedilol (COREG) 25 MG tablet Take 1 tablet (25 mg total) by mouth 2 (two) times daily. 01/30/20  Yes Belva Crome, MD  furosemide (LASIX) 40 MG tablet TAKE 1 TABLET(40 MG) BY MOUTH DAILY Patient taking differently: Take 40 mg by mouth daily.  03/18/20  Yes Belva Crome, MD  MELATONIN PO Take 1 tablet by mouth at bedtime as needed (sleep).   Yes [provider]  aspirin EC 81 MG tablet  Take 324 mg by mouth once.    [provider]    Physical Exam: Vitals:   03/20/20 0826 03/20/20 0830 03/20/20 0930  BP: (!) 143/88 (!) 146/87 137/85  Pulse: 72 71 71  Resp: (!) 27 19   Temp: 98.5 F (36.9 C)    TempSrc: Oral    SpO2: 100% 100%     Constitutional: NAD, calm,, comfortable, on room air, obese, communicating well, tearful Eyes: PERRL, lids and conjunctivae normal ENMT: Mucous membranes are moist. Posterior pharynx clear of any exudate or lesions.Normal dentition.  Neck: normal, supple, no masses, no thyromegaly Respiratory: clear to auscultation bilaterally, no wheezing, no crackles. Normal respiratory effort. No accessory muscle use.  Cardiovascular: Regular rate and rhythm, no murmurs / rubs / gallops. No extremity edema. 2+ pedal pulses. No carotid bruits.  Abdomen: no tenderness, no masses palpated. No hepatosplenomegaly. Bowel sounds positive.  Musculoskeletal: no clubbing / cyanosis. No joint deformity upper and lower extremities. Good ROM, no contractures. Normal muscle tone.  Skin: no rashes, lesions, ulcers. No induration Neurologic: CN 2-12 grossly intact. Sensation intact, DTR normal. Strength 5/5 in all 4.  Psychiatric: Normal judgment and insight. Alert and oriented x 3. Normal mood.    Labs on Admission: I have personally reviewed following labs and  imaging studies  CBC: Recent Labs  Lab 03/20/20 0839  WBC 5.4  HGB 12.2  HCT 37.9  MCV 87.9  PLT 654   Basic Metabolic Panel: Recent Labs  Lab 03/20/20 0839  NA 137  K 4.0  CL 104  CO2 22  GLUCOSE 104*  BUN 10  CREATININE 0.83  CALCIUM 9.4   GFR: Estimated Creatinine Clearance: 109.5 mL/min (by C-G formula based on SCr of 0.83 mg/dL). Liver Function Tests: No results for input(s): AST, ALT, ALKPHOS, BILITOT, PROT, ALBUMIN in the last 168 hours. No results for input(s): LIPASE, AMYLASE in the last 168 hours. No results for input(s): AMMONIA in the last 168 hours. Coagulation Profile: No results for input(s): INR, PROTIME in the last 168 hours. Cardiac Enzymes: No results for input(s): CKTOTAL, CKMB, CKMBINDEX, TROPONINI in the last 168 hours. BNP (last 3 results) Recent Labs    03/28/19 1214 12/08/19 1412  PROBNP 36 8   HbA1C: No results for input(s): HGBA1C in the last 72 hours. CBG: No results for input(s): GLUCAP in the last 168 hours. Lipid Profile: No results for input(s): CHOL, HDL, LDLCALC, TRIG, CHOLHDL, LDLDIRECT in the last 72 hours. Thyroid Function Tests: No results for input(s): TSH, T4TOTAL, FREET4, T3FREE, THYROIDAB in the last 72 hours. Anemia Panel: No results for input(s): VITAMINB12, FOLATE, FERRITIN, TIBC, IRON, RETICCTPCT in the last 72 hours. Urine analysis:    Component Value Date/Time   COLORURINE YELLOW 05/29/2019 1957   APPEARANCEUR CLOUDY (A) 05/29/2019 1957   LABSPEC 1.017 05/29/2019 1957   PHURINE 5.0 05/29/2019 1957   GLUCOSEU NEGATIVE 05/29/2019 1957   HGBUR MODERATE (A) 05/29/2019 [place] NEGATIVE 05/29/2019 1957   BILIRUBINUR n 08/12/2017 [place] 05/29/2019 1957   PROTEINUR 100 (A) 05/29/2019 1957   UROBILINOGEN negative (A) 08/12/2017 1330   NITRITE NEGATIVE 05/29/2019 1957   LEUKOCYTESUR LARGE (A) 05/29/2019 1957    Radiological Exams on Admission: DG Chest 2 View  Result Date:  03/20/2020 CLINICAL DATA:  Chest pressure. EXAM: CHEST - 2 VIEW COMPARISON:  05/29/2019 FINDINGS: The heart size and mediastinal contours are within normal limits. Both lungs are clear. No pleural effusions or pneumothorax. The visualized  skeletal structures are unremarkable. IMPRESSION: No acute cardiopulmonary disease. Electronically Signed   By: Margaretha Sheffield MD   On: 03/20/2020 09:12   CT Head Wo Contrast  Result Date: 03/20/2020 CLINICAL DATA:  Headache. Intracranial hemorrhage suspected. Head trauma. Syncope. EXAM: CT HEAD WITHOUT CONTRAST TECHNIQUE: Contiguous axial images were obtained from the base of the skull through the vertex without intravenous contrast. COMPARISON:  None. FINDINGS: Brain: No evidence of acute large vascular territory infarction, hemorrhage, hydrocephalus, extra-axial collection or mass lesion/mass effect. Vascular: No hyperdense vessel or unexpected calcification. Skull: No acute fracture. 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 Sinuses are otherwise clear. Unremarkable orbits. Other: No mastoid effusions. IMPRESSION: No evidence of acute intracranial abnormality. Electronically Signed   By: Margaretha Sheffield MD   On: 03/20/2020 10:16    EKG: Independently reviewed.  Sinus rhythm.  No ST elevation or depression noted.  Assessment/Plan Principal Problem:   Syncope Active Problems:   HTN (hypertension)   Morbid obesity (HCC)   Polycystic ovary syndrome   Chronic diastolic CHF (congestive heart failure) (HCC)   OSA on CPAP   GERD (gastroesophageal reflux disease)   Chest pain    Syncope: -Unknown etiology.  Polypharmacy?  Initial work-up including CBC, BMP, troponin, BNP: WNL, chest x-ray and CT head: Negative for acute findings.  Afebrile, COVID-19 pending. -Admit patient under observation.  On telemetry. -Orthostatic vital sign: Negative -Consult PT/OT.  Check TSH, UA and electrolytes. -Reviewed recent echo from 11/2019. -On  fall precautions  Chest pain: -Likely secondary to underlying stress.  Her initial work-up including initial troponin: Negative.  EKG: No acute changes.  Trend troponin. -Continue nitro/morphine as needed for pain control  Hypertension: -Continue amlodipine-benazepril and Coreg.  Monitor blood pressure closely  Chronic combined systolic and diastolic CHF: -Reviewed echo from 7/21 which showed improvement of ejection fraction to 60 to 65% and grade 1 diastolic dysfunction.  Patient appears euvolemic on exam.  Maintaining oxygen saturation on room air. -Continue Lasix.  Strict INO's and daily weight.  Check magnesium.   Morbid obesity with BMI of 37 -Encourage diet/exercise and weight loss.  OSA on CPAP: -Ordered CPAP at bedtime  Migraine headache: Continue Excedrin as needed  Gout: Continue allopurinol  PCOS: aware  DVT prophylaxis: Lovenox/SCD Code Status: Full code Family Communication: None present at bedside.  Plan of care discussed with patient in length and she verbalized understanding and agreed with it. Disposition Plan: Likely home tomorrow a.m. Consults called: None Admission status: Observation   Mckinley Jewel MD [facility]  If 7PM-7AM, please contact night-coverage [email or web address]  03/20/2020, 11:42 AM

## 2020-03-20 NOTE — ED Notes (Signed)
Patient transported to X-ray 

## 2020-03-20 NOTE — Telephone Encounter (Signed)
   Pt called the answering svc this am saying she had a syncopal episode and was having chest pain.   Called the pt back, but no answer.   Left a message saying I feel she should come to the ER for evaluation.   Checked later and she is in the ER.   Rosaria Ferries, PA-C 03/20/2020 9:36 AM

## 2020-03-20 NOTE — ED Provider Notes (Addendum)
[facility] DEPARTMENT Provider Note   CSN: [id number] Arrival date & time: 03/20/20  0818     History Chief Complaint  Patient presents with  . Chest Pain    Renee Roberson is a 39 y.o. female.  Patient presenting with a acute onset of left-sided chest pain at about 5 in the morning.  That was followed shortly thereafter by a syncopal episode patient stated that she just felt real dizzy prior to that.  And then when she woke up from the syncopal episode she had a posterior headache that did radiate towards the front that she thought was similar to her migraines.  But they don't usually come on in that fashion.  Patient denies any pain with movement of the left arm.  So it appears not related to the syncopal episode plus there was pain prior to that.  Patient's left-sided chest pain is been constant it does wax and wane.  But it is never gone away since it started at 5 in the morning.  Patient did take aspirin at home prior to arrival.  Patient does not have nitroglycerin at home.  Patient followed by cardiology Daneen Schick for known combined congestive heart failure.  Last seen by them on October 15 they reported that her ejection fraction was almost back to normal.  Patient also has a history of hypertension borderline diabetes.  Patient does have a history of a coronary CTA in November 2020 with no evidence of coronary artery disease.  Patient has not had cardiac catheterization.  Patient has had both Covid vaccines.        Past Medical History:  Diagnosis Date  . Abnormal Pap smear of cervix   . Accelerated essential hypertension 03/20/2013   Left ventricular systolic dysfunction as a complication   . Advanced maternal age, 1st pregnancy 12/12/2019   Formatting of this note might be different from the original. Dating: 06/03/2020 by U/S                                             First trimester:  [ ]  Prenatal labs reviewed  [ ]  SMA/Hgb  electrophoresis/CF   [ ]  Early 1 hr GTT*  [ ]  Baseline HELLP* [ ]  LDASA* [ ]  TB screening* [ ]  Medicaid patients: Pregnancy Medical Home Risk Screen (Code [phone])* Third trimester:  [ ]  CBC/RPR/HIV [ ]  1hr GTT [ ]   . Amenorrhea   . Chest pain    Coronary CTA 04/2019: Calcium score 0; no evidence of CAD  . Chronic combined systolic and diastolic heart failure ([place]) 11/13/2013   LVEF improved from 25% to 45% after 6 months of antihypertensive therapy   . Colchicine overdose 05/29/2019  . Conductive hearing loss of left ear with unrestricted hearing of right ear 11/02/2017  . Congestive heart failure ([place]) 12/12/2019   Last Assessment & Plan:  Formatting of this note might be different from the original. No new symptoms. Is continued labetalol 200 mg BID with good effect.  . Daytime sleepiness 08/17/2016  . Depression   . Diverticulitis of large intestine 12/30/2015  . GERD (gastroesophageal reflux disease)   . Gestational diabetes mellitus (GDM) in second trimester 01/15/2020   Last Assessment & Plan:  Formatting of this note might be different from the original. Patient failed 1hr gtt at 175 and then failed 3 hr gtt.  She has been trying medical nutrition therapy over the last few weeks with good effect although she does not have a log with her today. I discussed with Ms. Dulski whether these labs results indicated a diagnosis of gestational diabetes or preexisting diabe  . Gout   . Hepatic steatosis   . Hepatomegaly   . Hirsutism   . HTN (hypertension)   . Hypotension 08/20/2014  . Impacted cerumen of left ear 11/02/2017  . Infertility, female   . Migraine   . Morbid obesity ([place]) 03/20/2013  . OSA on CPAP 02/21/2018   Home sleep study showed severe obstructive sleep apnea with an AHI of 62.3 on night 1 and 92.0 and night to with oxygen desaturations less than 70% for 1 minute.  A total of 1 hour and 22 minutes was noted with O2 sats less than 89%.  52% of the time was spent with oxygen saturations  less than 90%. Now on CPAP at 14 cm H2O   . Polycystic ovary syndrome 03/20/2013  . Renal mass   . Snoring 08/17/2016  . STD (sexually transmitted disease)    HSV (genital)  . Systolic heart failure [facility])     Patient Active Problem List   Diagnosis Date Noted  . Positive ANA (antinuclear antibody) 03/13/2020  . Idiopathic chronic gout, unspecified site, without tophus (tophi) 03/13/2020  . Chronic right shoulder pain 03/13/2020  . Systolic heart failure ([place])   . STD (sexually transmitted disease)   . Migraine   . Infertility, female   . Hirsutism   . Hepatomegaly   . Hepatic steatosis   . GERD (gastroesophageal reflux disease)   . Depression   . Chest pain   . Amenorrhea   . Abnormal Pap smear of cervix   . Gestational diabetes mellitus (GDM) in second trimester 01/15/2020  . Advanced maternal age, 1st pregnancy 12/12/2019  . Congestive heart failure ([place]) 12/12/2019  . Renal mass 12/12/2019  . Colchicine overdose 05/29/2019  . OSA on CPAP 02/21/2018  . Conductive hearing loss of left ear with unrestricted hearing of right ear 11/02/2017  . Impacted cerumen of left ear 11/02/2017  . Snoring 08/17/2016  . Daytime sleepiness 08/17/2016  . Diverticulitis of large intestine 12/30/2015  . Hypotension 08/20/2014  . Chronic combined systolic and diastolic heart failure ([place]) 11/13/2013  . HTN (hypertension) 03/20/2013    Class: Chronic  . Morbid obesity ([place]) 03/20/2013  . Polycystic ovary syndrome 03/20/2013    Class: Chronic  . Accelerated essential hypertension 03/20/2013    Past Surgical History:  Procedure Laterality Date  . COLPOSCOPY  2015  . no surgical hx       OB History    Gravida  2   Para  0   Term  0   Preterm  0   AB  1   Living  0     SAB  1   TAB      Ectopic      Multiple      Live Births              Family History  Problem Relation Age of Onset  . Hypertension Mother   . Hypertension Father   . Thyroid disease Father    . Hypertension Sister   . Cancer Maternal Grandmother        ovarian or colon  . Diabetes Paternal Grandfather     Social History   Tobacco Use  . Smoking status: Never Smoker  .  Smokeless tobacco: Never Used  Vaping Use  . Vaping Use: Never used  Substance Use Topics  . Alcohol use: No    Alcohol/week: 0.0 standard drinks  . Drug use: No    Home Medications Prior to Admission medications   Medication Sig Start Date End Date Taking? Authorizing Provider  acetaminophen (TYLENOL) 500 MG tablet Take 500 mg by mouth every 6 (six) hours as needed for mild pain.    [provider]  allopurinol (ZYLOPRIM) 100 MG tablet TAKE 2 TABLETS(200 MG) BY MOUTH DAILY 03/16/20   Rice, Resa Miner, MD  amLODipine-benazepril (LOTREL) 5-40 MG capsule Take 1 capsule by mouth daily. 01/30/20   Belva Crome, MD  aspirin-acetaminophen-caffeine (EXCEDRIN MIGRAINE) [phone] MG tablet Take by mouth as needed for headache.    [provider]  carvedilol (COREG) 25 MG tablet Take 1 tablet (25 mg total) by mouth 2 (two) times daily. 01/30/20   Belva Crome, MD  COLCHICINE PO Take by mouth as needed.     [provider]  furosemide (LASIX) 40 MG tablet TAKE 1 TABLET(40 MG) BY MOUTH DAILY 03/18/20   Belva Crome, MD  omeprazole (PRILOSEC) 40 MG capsule Take 40 mg by mouth as needed (heart burn/gerd).    [provider]    Allergies    Fish allergy and Iodine  Review of Systems   Review of Systems  HENT: Negative for congestion.   Cardiovascular: Positive for chest pain.  Neurological: Positive for syncope and headaches.    Physical Exam Updated Vital Signs BP 137/85   Pulse 71   Temp 98.5 F (36.9 C) (Oral)   Resp 19   LMP 08/22/2019 (Approximate)   SpO2 100%   Physical Exam Vitals and nursing note reviewed.  Constitutional:      General: She is not in acute distress.    Appearance: Normal appearance. She is well-developed.  HENT:     Head:  Normocephalic and atraumatic.  Eyes:     Extraocular Movements: Extraocular movements intact.     Conjunctiva/sclera: Conjunctivae normal.     Pupils: Pupils are equal, round, and reactive to light.  Cardiovascular:     Rate and Rhythm: Normal rate and regular rhythm.     Heart sounds: No murmur heard.   Pulmonary:     Effort: Pulmonary effort is normal. No respiratory distress.     Breath sounds: Normal breath sounds.  Chest:     Chest wall: No tenderness.  Abdominal:     Palpations: Abdomen is soft.     Tenderness: There is no abdominal tenderness.  Musculoskeletal:        General: Swelling present.     Cervical back: Neck supple.  Skin:    General: Skin is warm and dry.  Neurological:     General: No focal deficit present.     Mental Status: She is alert and oriented to person, place, and time.     Cranial Nerves: No cranial nerve deficit.     Sensory: No sensory deficit.     ED Results / Procedures / Treatments   Labs (all labs ordered are listed, but only abnormal results are displayed) Labs Reviewed  BASIC METABOLIC PANEL - Abnormal; Notable for the following components:      Result Value   Glucose, Bld 104 (*)    All other components within normal limits  CBC  BRAIN NATRIURETIC PEPTIDE  HCG, SERUM, QUALITATIVE  I-STAT BETA HCG BLOOD, ED ([facility], [facility], [facility] ONLY)  TROPONIN I (HIGH SENSITIVITY)  TROPONIN I (HIGH SENSITIVITY)    EKG EKG Interpretation  Date/Time:  Wednesday March 20 2020 08:25:51 EDT Ventricular Rate:  70 PR Interval:    QRS Duration: 77 QT Interval:  411 QTC Calculation: 444 R Axis:   12 Text Interpretation: Sinus rhythm Confirmed by Fredia Sorrow [phone]) on 03/20/2020 8:35:50 AM   Radiology DG Chest 2 View  Result Date: 03/20/2020 CLINICAL DATA:  Chest pressure. EXAM: CHEST - 2 VIEW COMPARISON:  05/29/2019 FINDINGS: The heart size and mediastinal contours are within normal limits. Both lungs are clear. No pleural effusions or  pneumothorax. The visualized skeletal structures are unremarkable. IMPRESSION: No acute cardiopulmonary disease. Electronically Signed   By: Margaretha Sheffield MD   On: 03/20/2020 09:12   CT Head Wo Contrast  Result Date: 03/20/2020 CLINICAL DATA:  Headache. Intracranial hemorrhage suspected. Head trauma. Syncope. EXAM: CT HEAD WITHOUT CONTRAST TECHNIQUE: Contiguous axial images were obtained from the base of the skull through the vertex without intravenous contrast. COMPARISON:  None. FINDINGS: Brain: No evidence of acute large vascular territory infarction, hemorrhage, hydrocephalus, extra-axial collection or mass lesion/mass effect. Vascular: No hyperdense vessel or unexpected calcification. Skull: No acute fracture. 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 Sinuses are otherwise clear. Unremarkable orbits. Other: No mastoid effusions. IMPRESSION: No evidence of acute intracranial abnormality. Electronically Signed   By: Margaretha Sheffield MD   On: 03/20/2020 10:16    Procedures Procedures (including critical care time)  CRITICAL CARE Performed by: Fredia Sorrow Total critical care time: 35 minutes Critical care time was exclusive of separately billable procedures and treating other patients. Critical care was necessary to treat or prevent imminent or life-threatening deterioration. Critical care was time spent personally by me on the following activities: development of treatment plan with patient and/or surrogate as well as nursing, discussions with consultants, evaluation of patient's response to treatment, examination of patient, obtaining history from patient or surrogate, ordering and performing treatments and interventions, ordering and review of laboratory studies, ordering and review of radiographic studies, pulse oximetry and re-evaluation of patient's condition.   Medications Ordered in ED Medications  nitroGLYCERIN (NITROSTAT) SL tablet 0.4 mg (has no  administration in time range)  morphine 2 MG/ML injection 2 mg (has no administration in time range)  acetaminophen (TYLENOL) tablet 1,000 mg (1,000 mg Oral Given 03/20/20 5462)    ED Course  I have reviewed the triage vital signs and the nursing notes.  Pertinent labs & imaging results that were available during my care of the patient were reviewed by me and considered in my medical decision making (see chart for details).    MDM Rules/Calculators/A&P                           Patient received aspirin prior to arrival.  Patient given nitroglycerin here.  Patient's pain was like 5 out of 10 at worst it was 10 out of 10.  With the nitroglycerin it went down to about 2.  But not completely resolved.  Initial troponin was very normal.  Chest x-ray normal BMP normal EKG without any acute changes.  With patient with persistent chest pain also in the face of the syncopal episode.  Feel the patient needs medical admission serial cardiac enzymes and cardiac monitoring.  Is reassuring the patient had a normal CTA of the coronary arteries in November 2020.  But difficult to explain this persistent pain.  Patient will  be given 2 mg of morphine to help resolve the pain.  Head CT without any acute findings.  And the Tylenol that patient was given also helped improve the headache.  Not completely resolved but much better.  Discussed with the hospitalist regarding admission for the persistent chest pain as well as a syncopal episode.  Patient is never had syncope in the past.    Final Clinical Impression(s) / ED Diagnoses Final diagnoses:  Precordial pain  Syncope, unspecified syncope type  Acute intractable headache, unspecified headache type    Rx / DC Orders ED Discharge Orders    None       Fredia Sorrow, MD 03/20/20 1048    Fredia Sorrow, MD 03/20/20 1048

## 2020-03-21 ENCOUNTER — Encounter (HOSPITAL_COMMUNITY): Payer: Self-pay | Admitting: Internal Medicine

## 2020-03-21 ENCOUNTER — Other Ambulatory Visit: Payer: Self-pay

## 2020-03-21 DIAGNOSIS — R55 Syncope and collapse: Secondary | ICD-10-CM | POA: Diagnosis not present

## 2020-03-21 LAB — COMPREHENSIVE METABOLIC PANEL
ALT: 24 U/L (ref 0–44)
AST: 16 U/L (ref 15–41)
Albumin: 3.8 g/dL (ref 3.5–5.0)
Alkaline Phosphatase: 44 U/L (ref 38–126)
Anion gap: 10 (ref 5–15)
BUN: 12 mg/dL (ref 6–20)
CO2: 24 mmol/L (ref 22–32)
Calcium: 9.4 mg/dL (ref 8.9–10.3)
Chloride: 105 mmol/L (ref 98–111)
Creatinine, Ser: 1.09 mg/dL — ABNORMAL HIGH (ref 0.44–1.00)
GFR, Estimated: >60 mL/min (ref >60)
Glucose, Bld: 117 mg/dL — ABNORMAL HIGH (ref 70–99)
Potassium: 4 mmol/L (ref 3.5–5.1)
Sodium: 139 mmol/L (ref 135–145)
Total Bilirubin: 1.2 mg/dL (ref 0.3–1.2)
Total Protein: 6.9 g/dL (ref 6.5–8.1)

## 2020-03-21 LAB — CBC
HCT: 39 % (ref 36.0–46.0)
Hemoglobin: 12.4 g/dL (ref 12.0–15.0)
MCH: 27.7 pg (ref 26.0–34.0)
MCHC: 31.8 g/dL (ref 30.0–36.0)
MCV: 87.2 fL (ref 80.0–100.0)
Platelets: 275 10*3/uL (ref 150–400)
RBC: 4.47 MIL/uL (ref 3.87–5.11)
RDW: 14.3 % (ref 11.5–15.5)
WBC: 6.5 10*3/uL (ref 4.0–10.5)
nRBC: 0 % (ref 0.0–0.2)

## 2020-03-21 LAB — D-DIMER, QUANTITATIVE: D-Dimer, Quant: 0.48 ug/mL-FEU (ref 0.00–0.50)

## 2020-03-21 NOTE — Evaluation (Signed)
Occupational Therapy Evaluation Patient Details Name: Renee Roberson MRN: [id number] DOB: 03/20/1981 Today's Date: 03/21/2020    History of Present Illness Pt is a 39 y/o female with PMH of PCOS, obstructive sleep apnea on CPAP, morbid obesity, HTN, anxiety, CHF, morbid obesity, presenting to ED with chest pain and syncope. Chest xray and CT head negative.    Clinical Impression   PTA patient reports independent and working (but out of work currently since August).  Admitted for above and presenting near baseline independent level for ADLs, functional mobility and transfers.  Pt with 1 LOB during ambulation into restroom, but self correcting without assistance- therapist provided supervision.  Discussed activity modifications, placement of items at home as reports intermittent dizziness after taking certain medications.  Discussed pacing, stress management and relaxation techniques as well.  Pt thankful for information provided.  Based on performance today, no further OT needs have been identified and OT will sign off.     Follow Up Recommendations  No OT follow up    Equipment Recommendations  None recommended by OT    Recommendations for Other Services       Precautions / Restrictions Restrictions Weight Bearing Restrictions: No      Mobility Bed Mobility Overal bed mobility: Independent                  Transfers Overall transfer level: Independent                    Balance Overall balance assessment: No apparent balance deficits (not formally assessed)                                         ADL either performed or assessed with clinical judgement   ADL Overall ADL's : Independent                                       General ADL Comments: patient with 1 LOB entering bathroom, requires supervision but self corrects without assist; independent for ADLs, mobiltiy and transfers      Vision         Perception      Praxis      Pertinent Vitals/Pain Pain Assessment: No/denies pain     Hand Dominance     Extremity/Trunk Assessment Upper Extremity Assessment Upper Extremity Assessment: Overall WFL for tasks assessed   Lower Extremity Assessment Lower Extremity Assessment: Overall WFL for tasks assessed       Communication Communication Communication: No difficulties   Cognition Arousal/Alertness: Awake/alert Behavior During Therapy: WFL for tasks assessed/performed Overall Cognitive Status: Within Functional Limits for tasks assessed                                     General Comments  discussed activity modifications, placement of items with dizziness (due to medication symptoms at times), pacing, relaxation techniques and recommendations for stress.  Patient discussed depression since miscarriage and reports difficulty managing this on her own--RN notified and encouarged patient to mention this to MD.     Exercises     Shoulder Instructions      Home Living Family/patient expects to be discharged to:: Private residence Living Arrangements: Spouse/significant other (boyfriend) Available Help  at Discharge: Family;Available PRN/intermittently Type of Home: House             Bathroom Shower/Tub: Teacher, early years/pre: Standard     Home Equipment: None          Prior Functioning/Environment Level of Independence: Independent                 OT Problem List:        OT Treatment/Interventions:      OT Goals(Current goals can be found in the care plan section) Acute Rehab OT Goals Patient Stated Goal: to get home today OT Goal Formulation: With patient  OT Frequency:     Barriers to D/C:            Co-evaluation              AM-PAC OT "6 Clicks" Daily Activity     Outcome Measure Help from another person eating meals?: None Help from another person taking care of personal grooming?: None Help from another person  toileting, which includes using toliet, bedpan, or urinal?: None Help from another person bathing (including washing, rinsing, drying)?: None Help from another person to put on and taking off regular upper body clothing?: None Help from another person to put on and taking off regular lower body clothing?: None 6 Click Score: 24   End of Session Nurse Communication: Mobility status  Activity Tolerance: Patient tolerated treatment well Patient left: in bed;with call bell/phone within reach  OT Visit Diagnosis: Other abnormalities of gait and mobility (R26.89)                Time: 9417-4081 OT Time Calculation (min): 18 min Charges:  OT General Charges $OT Visit: 1 Visit OT Evaluation $OT Eval Low Complexity: 1 Low  Renee Roberson, OT Acute Rehabilitation Services Pager [phone] Office [phone]   Renee Roberson 03/21/2020, 9:31 AM

## 2020-03-21 NOTE — Progress Notes (Signed)
Nsg Discharge Note  Admit Date:  03/20/2020 Discharge date: 03/21/2020   Renee Roberson to be D/C'd Home per MD order.  AVS completed.  Copy for chart, and copy for patient signed, and dated. Patient/caregiver able to verbalize understanding.  Discharge Medication: Allergies as of 03/21/2020      Reactions   Fish Allergy Anaphylaxis, Hives, Swelling   Iodine       Medication List    TAKE these medications   allopurinol 100 MG tablet Commonly known as: ZYLOPRIM TAKE 2 TABLETS(200 MG) BY MOUTH DAILY What changed: See the new instructions.   amLODipine-benazepril 5-40 MG capsule Commonly known as: LOTREL Take 1 capsule by mouth daily.   aspirin EC 81 MG tablet Take 324 mg by mouth once.   aspirin-acetaminophen-caffeine 250-250-65 MG tablet Commonly known as: EXCEDRIN MIGRAINE Take 2 tablets by mouth as needed for headache.   carvedilol 25 MG tablet Commonly known as: COREG Take 1 tablet (25 mg total) by mouth 2 (two) times daily.   furosemide 40 MG tablet Commonly known as: LASIX TAKE 1 TABLET(40 MG) BY MOUTH DAILY What changed: See the new instructions.   MELATONIN PO Take 1 tablet by mouth at bedtime as needed (sleep).       Discharge Assessment: Vitals:   03/21/20 0813 03/21/20 1152  BP: 130/84 (!) 134/95  Pulse: 71 75  Resp:  16  Temp:  98.4 F (36.9 C)  SpO2:  100%   Skin clean, dry and intact without evidence of skin break down, no evidence of skin tears noted. IV catheter discontinued intact. Site without signs and symptoms of complications - no redness or edema noted at insertion site, patient denies c/o pain - only slight tenderness at site.  Dressing with slight pressure applied.  D/c Instructions-Education: Discharge instructions given to patient/family with verbalized understanding. D/c education completed with patient/family including follow up instructions, medication list, d/c activities limitations if indicated, with other d/c  instructions as indicated by MD - patient able to verbalize understanding, all questions fully answered. Patient instructed to return to ED, call 911, or call MD for any changes in condition.  Patient escorted via [place], and D/C home via private auto.  Erasmo Leventhal, RN 03/21/2020 12:19 PM

## 2020-03-21 NOTE — Progress Notes (Signed)
PT Cancellation Note  Patient Details Name: Gerard F Perham MRN: [id number] DOB: 03/01/1981   Cancelled Treatment:    Reason Eval/Treat Not Completed: Other (comment).  Met briefly with pt who is feeling comfortable with her mobility, and had just moved a chair across her room.  Follow up tomorrow to be sure she is still feeling good, sat at rest was 99%.  DC PT order if she is not needing therapy.   Ruth E Stout 03/21/2020, 10:33 AM   Ruth Stout, PT MS Acute Rehab Dept. Number: [phone] and [facility] [phone]    

## 2020-03-21 NOTE — Discharge Summary (Signed)
Physician Discharge Summary  RICKESHA VERACRUZ BHA:[id number] DOB: 1980/11/27 DOA: 03/20/2020  PCP: Donald Prose, MD  Admit date: 03/20/2020 Discharge date: 03/21/2020  Admitted From: home Discharge disposition: home   Recommendations for Outpatient Follow-Up:   outpatient referral to counselling  Discharge Diagnosis:   Principal Problem:   Syncope Active Problems:   HTN (hypertension)   Morbid obesity ([place])   Polycystic ovary syndrome   Chronic diastolic CHF (congestive heart failure) (HCC)   OSA on CPAP   GERD (gastroesophageal reflux disease)   Chest pain    Discharge Condition: Improved.  Diet recommendation: Low sodium, heart healthy.  Carbohydrate-modified.  Wound care: None.  Code status: Full.   History of Present Illness:   Renee Roberson is a 39 y.o. female with medical history significant of PCOS, obstructive sleep apnea on CPAP, morbid obesity, hypertension, anxiety, chronic combined systolic and diastolic CHF-echo from 01/7352 showed improvement in ejection fraction from 25% to 60 to 65% with grade 1 diastolic dysfunction presents to emergency department with chest pain and syncope.  Patient tells me that she started having severe chest pain/chest tightness, midsternal, nonradiating, no aggravating or relieving factors, 10 out of 10, associated with mild shortness of breath that was followed by a syncopal episode.  Reports that she felt dizzy prior to syncopal episode and woke up on the floor.  She has history of migraine and reports posterior headache when she woke up.  She denies seizures, head trauma, chest pain, palpitation, leg swelling, orthopnea, PND, nausea, vomiting, abdominal pain, urinary or bowel changes.  She tells me that she is stressed out due to recent miscarriage(first pregnancy) in August 2021.  She was tearful during the encounter and tells me that she has tried Zoloft in the past which did not helped her and made her sick  therefore she stopped taking it.  She denies active suicidal or homicidal thoughts.  She lives with her boyfriend at home.  No history of smoking, alcohol, street drug use.  She is compliant with CPAP at nighttime.  She is followed by cardiology Dr. Daneen Schick and had coronary CTA November 2020 with no evidence of coronary artery disease.  She is fully vaccinated against COVID-19.  Took two sleeping pills last night around 10 pm  Hospital Course by Problem:   Syncope: -Unknown etiology.  Polypharmacy-- took 2 sleeping pills prior to fall/event?  Initial work-up including CBC, BMP, troponin, BNP: WNL, chest x-ray and CT head: Negative for acute findings.  Afebrile, COVID-19 pending.. -Orthostatic vital sign: Negative -Mg lower end of normal- replete QHS -Reviewed recent echo from 11/2019. -no sign of seizures -tele unremarkable  Chest pain: -Likely secondary to underlying stress.  Her initial work-up including initial troponin: Negative.  EKG: No acute changes.  Trend troponin.  Hypertension: -Continue amlodipine-benazepril and Coreg.  Monitor blood pressure closely  Chronic combined systolic and diastolic CHF: -Reviewed echo from 7/21 which showed improvement of ejection fraction to 60 to 65% and grade 1 diastolic dysfunction.  Patient appears euvolemic on exam.  Maintaining oxygen saturation on room air. -Continue Lasix.  Strict INO's and daily weight.    Morbid obesity with BMI of 37 -Encourage diet/exercise and weight loss.  OSA on CPAP: -Ordered CPAP at bedtime  Migraine headache: Continue Excedrin as needed  Gout: Continue allopurinol  Insomnia -magnesium QHS and sleep hygiene     Medical Consultants:      Discharge Exam:   Vitals:   03/21/20 2992 03/21/20 4268  BP: 124/81 130/84  Pulse: 69 71  Resp: 16   Temp: 98.3 F (36.8 C)   SpO2: 98%    Vitals:   03/21/20 0027 03/21/20 0457 03/21/20 0657 03/21/20 0813  BP: 118/78 124/81  130/84  Pulse:  68 69  71  Resp: 16 16    Temp: 98.2 F (36.8 C) 98.3 F (36.8 C)    TempSrc: Axillary Axillary    SpO2: 97% 98%    Weight:   102.1 kg     General exam: Appears calm and comfortable. Feeling much better  The results of significant diagnostics from this hospitalization (including imaging, microbiology, ancillary and laboratory) are listed below for reference.     Procedures and Diagnostic Studies:   DG Chest 2 View  Result Date: 03/20/2020 CLINICAL DATA:  Chest pressure. EXAM: CHEST - 2 VIEW COMPARISON:  05/29/2019 FINDINGS: The heart size and mediastinal contours are within normal limits. Both lungs are clear. No pleural effusions or pneumothorax. The visualized skeletal structures are unremarkable. IMPRESSION: No acute cardiopulmonary disease. Electronically Signed   By: Margaretha Sheffield MD   On: 03/20/2020 09:12   CT Head Wo Contrast  Result Date: 03/20/2020 CLINICAL DATA:  Headache. Intracranial hemorrhage suspected. Head trauma. Syncope. EXAM: CT HEAD WITHOUT CONTRAST TECHNIQUE: Contiguous axial images were obtained from the base of the skull through the vertex without intravenous contrast. COMPARISON:  None. FINDINGS: Brain: No evidence of acute large vascular territory infarction, hemorrhage, hydrocephalus, extra-axial collection or mass lesion/mass effect. Vascular: No hyperdense vessel or unexpected calcification. Skull: No acute fracture. 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 Sinuses are otherwise clear. Unremarkable orbits. Other: No mastoid effusions. IMPRESSION: No evidence of acute intracranial abnormality. Electronically Signed   By: Margaretha Sheffield MD   On: 03/20/2020 10:16     Labs:   Basic Metabolic Panel: Recent Labs  Lab 03/20/20 0839 03/20/20 1238 03/21/20 0342  NA 137  --  139  K 4.0  --  4.0  CL 104  --  105  CO2 22  --  24  GLUCOSE 104*  --  117*  BUN 10  --  12  CREATININE 0.83 0.75 1.09*  CALCIUM 9.4  --  9.4  MG   --  1.7  --   PHOS  --  4.5  --    GFR Estimated Creatinine Clearance: 82.9 mL/min (A) (by C-G formula based on SCr of 1.09 mg/dL (H)). Liver Function Tests: Recent Labs  Lab 03/21/20 0342  AST 16  ALT 24  ALKPHOS 44  BILITOT 1.2  PROT 6.9  ALBUMIN 3.8   No results for input(s): LIPASE, AMYLASE in the last 168 hours. No results for input(s): AMMONIA in the last 168 hours. Coagulation profile No results for input(s): INR, PROTIME in the last 168 hours.  CBC: Recent Labs  Lab 03/20/20 0839 03/20/20 1238 03/21/20 0342  WBC 5.4 5.2 6.5  HGB 12.2 12.0 12.4  HCT 37.9 37.9 39.0  MCV 87.9 87.9 87.2  PLT 278 271 275   Cardiac Enzymes: No results for input(s): CKTOTAL, CKMB, CKMBINDEX, TROPONINI in the last 168 hours. BNP: Invalid input(s): POCBNP CBG: No results for input(s): GLUCAP in the last 168 hours. D-Dimer Recent Labs    03/21/20 0757  DDIMER 0.48   Hgb A1c No results for input(s): HGBA1C in the last 72 hours. Lipid Profile No results for input(s): CHOL, HDL, LDLCALC, TRIG, CHOLHDL, LDLDIRECT in the last 72 hours. Thyroid function studies Recent Labs  03/20/20 1238  TSH 1.328   Anemia work up No results for input(s): VITAMINB12, FOLATE, FERRITIN, TIBC, IRON, RETICCTPCT in the last 72 hours. Microbiology Recent Results (from the past 240 hour(s))  Respiratory Panel by RT PCR (Flu A&B, Covid) - Nasopharyngeal Swab     Status: None   Collection Time: 03/20/20 10:53 AM   Specimen: Nasopharyngeal Swab  Result Value Ref Range Status   SARS Coronavirus 2 by RT PCR NEGATIVE NEGATIVE Final    Comment: (NOTE) SARS-CoV-2 target nucleic acids are NOT DETECTED.  The SARS-CoV-2 RNA is generally detectable in upper respiratoy specimens during the acute phase of infection. The lowest concentration of SARS-CoV-2 viral copies this assay can detect is 131 copies/mL. A negative result does not preclude SARS-Cov-2 infection and should not be used as the sole basis  for treatment or other patient management decisions. A negative result may occur with  improper specimen collection/handling, submission of specimen other than nasopharyngeal swab, presence of viral mutation(s) within the areas targeted by this assay, and inadequate number of viral copies (<131 copies/mL). A negative result must be combined with clinical observations, patient history, and epidemiological information. The expected result is Negative.  Fact Sheet for Patients:  PinkCheek.be  Fact Sheet for Healthcare Providers:  GravelBags.it  This test is no t yet approved or cleared by the [place] FDA and  has been authorized for detection and/or diagnosis of SARS-CoV-2 by FDA under an Emergency Use Authorization (EUA). This EUA will remain  in effect (meaning this test can be used) for the duration of the COVID-19 declaration under Section 564(b)(1) of the Act, 21 U.S.C. section 360bbb-3(b)(1), unless the authorization is terminated or revoked sooner.     Influenza A by PCR NEGATIVE NEGATIVE Final   Influenza B by PCR NEGATIVE NEGATIVE Final    Comment: (NOTE) The Xpert Xpress SARS-CoV-2/FLU/RSV assay is intended as an aid in  the diagnosis of influenza from Nasopharyngeal swab specimens and  should not be used as a sole basis for treatment. Nasal washings and  aspirates are unacceptable for Xpert Xpress SARS-CoV-2/FLU/RSV  testing.  Fact Sheet for Patients: PinkCheek.be  Fact Sheet for Healthcare Providers: GravelBags.it  This test is not yet approved or cleared by the [place] FDA and  has been authorized for detection and/or diagnosis of SARS-CoV-2 by  FDA under an Emergency Use Authorization (EUA). This EUA will remain  in effect (meaning this test can be used) for the duration of the  Covid-19 declaration under Section 564(b)(1) of the Act, 21   U.S.C. section 360bbb-3(b)(1), unless the authorization is  terminated or revoked. Performed at [facility], [place] [address], [place], [place] [address]      Discharge Instructions:   Discharge Instructions    (HEART FAILURE PATIENTS) Call MD:  Anytime you have any of the following symptoms: 1) 3 pound weight gain in 24 hours or 5 pounds in 1 week 2) shortness of breath, with or without a dry hacking cough 3) swelling in the hands, feet or stomach 4) if you have to sleep on extra pillows at night in order to breathe.   Complete by: As directed    Ambulatory referral to Psychology   Complete by: As directed    Diet - low sodium heart healthy   Complete by: As directed    Diet Carb Modified   Complete by: As directed    Discharge instructions   Complete by: As directed    magnesium glycinate is  one of the most common magnesium supplements used for achieving better sleep-- can get OTC and take QHS   Increase activity slowly   Complete by: As directed      Allergies as of 03/21/2020      Reactions   Fish Allergy Anaphylaxis, Hives, Swelling   Iodine       Medication List    TAKE these medications   allopurinol 100 MG tablet Commonly known as: ZYLOPRIM TAKE 2 TABLETS(200 MG) BY MOUTH DAILY What changed: See the new instructions.   amLODipine-benazepril 5-40 MG capsule Commonly known as: LOTREL Take 1 capsule by mouth daily.   aspirin EC 81 MG tablet Take 324 mg by mouth once.   aspirin-acetaminophen-caffeine 250-250-65 MG tablet Commonly known as: EXCEDRIN MIGRAINE Take 2 tablets by mouth as needed for headache.   carvedilol 25 MG tablet Commonly known as: COREG Take 1 tablet (25 mg total) by mouth 2 (two) times daily.   furosemide 40 MG tablet Commonly known as: LASIX TAKE 1 TABLET(40 MG) BY MOUTH DAILY What changed: See the new instructions.   MELATONIN PO Take 1 tablet by mouth at bedtime as needed (sleep).       Follow-up Information    Donald Prose, MD Follow up.   Specialty: Family Medicine Why: or Dr. Genice Rouge information: [place] [address] [phone]        Sueanne Margarita, MD .   Specialty: Cardiology Contact information: [address] [place] [address] [phone]        Belva Crome, MD .   Specialty: Cardiology Contact information: [phone] [place] [address] [phone]                Time coordinating discharge: 25 min  Signed:  Geradine Girt DO  [facility] 03/21/2020, 11:48 AM

## 2020-03-25 NOTE — Progress Notes (Signed)
Ms. Gazzola lab tests for lupus are negative but test for antiphospholipid antibody syndrome are positive. It is recommended to repeat this test in a few months to confirm if it is positive so we can follow up as planned.

## 2020-04-11 ENCOUNTER — Ambulatory Visit (INDEPENDENT_AMBULATORY_CARE_PROVIDER_SITE_OTHER): Payer: BC Managed Care – PPO | Admitting: Internal Medicine

## 2020-04-11 ENCOUNTER — Encounter: Payer: Self-pay | Admitting: Internal Medicine

## 2020-04-11 ENCOUNTER — Other Ambulatory Visit: Payer: Self-pay

## 2020-04-11 VITALS — BP 113/76 | HR 83 | Ht 65.0 in | Wt 227.0 lb

## 2020-04-11 DIAGNOSIS — R768 Other specified abnormal immunological findings in serum: Secondary | ICD-10-CM

## 2020-04-11 DIAGNOSIS — Z79899 Other long term (current) drug therapy: Secondary | ICD-10-CM

## 2020-04-11 DIAGNOSIS — M1A0710 Idiopathic chronic gout, right ankle and foot, without tophus (tophi): Secondary | ICD-10-CM | POA: Diagnosis not present

## 2020-04-11 NOTE — Patient Instructions (Signed)
Lupus Anticoagulant Panel Test Why am I having this test? The lupus anticoagulant panel test can be used to help find the cause of abnormal blood clotting. Your health care provider may recommend this test if:  You are a woman and have had repeated miscarriages, preterm labor, or high blood pressure in pregnancy (preeclampsia).  You have had previous blood tests showing a longer-than-normal blood clotting time or a lower-than-normal number of platelets (thrombocytopenia).  Your health care provider suspects that you have a condition called antiphospholipid syndrome.  You have systemic lupus erythematosus (SLE, or lupus) or your health care provider suspects that you have this condition.  Your health care provider suspects that you have some type of rheumatic disease. What is being tested? This test checks your blood for certain autoantibodies (lupus anticoagulant autoantibodies) that can interfere with the normal blood clotting process. Autoantibodies are proteins that your body's defense system (immune system) makes to help fight invading germs, such as bacteria and viruses. Sometimes, the body mistakes normal tissues for abnormal ones, and it attacks those tissues as if they were invading germs. This is called an autoimmune response. The autoantibodies checked for in this test can mistakenly attack phospholipids, which are a normal part of many types of cells in the body. When blood clotting cells (platelets) are attacked by antiphospholipid antibodies, abnormal blood clotting can occur. When this happens, you are at an increased risk of developing repeated blood clots in your arteries and veins. You are also at an increased risk for heart attack or stroke. What kind of sample is taken?  A blood sample is required for this test. It is usually collected by inserting a needle into a blood vessel or by sticking a finger with a small needle. Tell a health care provider about:  All medicines  you are taking, including vitamins, herbs, eye drops, creams, and over-the-counter medicines.  Any medical conditions you have. How are the results reported? Your test results will be reported as values that indicate whether lupus anticoagulant autoantibodies were found in your blood. Your health care provider will compare your results to normal values that were established after testing a large group of people (reference values). Reference values may vary among labs and hospitals. For this test, common normal reference values are:  Less than 11 MPL (IgM phospholipid units).  Less than 23 GPL (IgG phospholipid units). What do the results mean? Results that are higher than the reference values may indicate the following health conditions:  Lupus.  Antiphospholipid syndrome. Talk with your health care provider about what your results mean. Questions to ask your health care provider Ask your health care provider, or the department that is doing the test:  When will my results be ready?  How will I get my results?  What are my treatment options?  What other tests do I need?  What are my next steps? Summary  The lupus anticoagulant panel test can be used to help find the cause of abnormal blood clotting.  This test checks your blood for certain proteins (autoantibodies) that can interfere with the normal blood clotting process.  The autoantibodies checked for in this test can mistakenly attack phospholipids, which are a normal part of many types of cells in the body. If the autoantibodies attack blood clotting cells (platelets), you may develop frequent blood clots.  Talk with your health care provider about what your test results mean. This information is not intended to replace advice given to you by your  health care provider. Make sure you discuss any questions you have with your health care provider. Document Revised: 04/30/2017 Document Reviewed: 02/18/2017 Elsevier Patient  Education  2020 [employer].

## 2020-04-11 NOTE — Progress Notes (Signed)
Office Visit Note  Patient: Renee Roberson             Date of Birth: 1981-05-07           MRN: [id number]             PCP: Donald Prose, MD Referring: Donald Prose, MD Visit Date: 04/11/2020  Subjective:  Follow-up  History of Present Illness: Renee Roberson is a 39 y.o. female here for follow up of her gout and positive lupus anticoagulant antibodies.  At her last visit she was experiencing pain especially in the back and right shoulder and also been noticing an itchy rash especially around the back of the neck.  After that visit she increase allopurinol from 100 mg daily to 200 mg daily at the uric acid was high at 8.1.  Over the interval she suffered a syncopal episode in the shower and had precordial pain which is evaluated at the hospital in late October.  No particular arrhythmia or new cardiac problem was identified during that work-up.  She did not notice any particular change in symptoms or side effects with increase in the allopurinol dose.  She is not had any gout flare in the interval.  Work-up to exclude underlying autoimmune condition with positive ANA was negative for specific antibodies but did have a positive lupus anticoagulant test.  Activities of Daily Living:  Patient reports morning stiffness for 6-8 hours.   Patient Reports nocturnal pain.  Difficulty dressing/grooming: Reports Difficulty climbing stairs: Denies Difficulty getting out of chair: Denies Difficulty using hands for taps, buttons, cutlery, and/or writing: Denies  Review of Systems  Constitutional: Negative for fatigue.  HENT: Negative for mouth sores, mouth dryness and nose dryness.   Eyes: Negative for visual disturbance and dryness.  Respiratory: Negative for cough, hemoptysis, shortness of breath and difficulty breathing.   Cardiovascular: Negative for chest pain, palpitations and swelling in legs/feet.  Gastrointestinal: Positive for constipation. Negative for abdominal pain, blood in stool  and diarrhea.  Endocrine: Negative for increased urination.  Genitourinary: Negative for painful urination.  Musculoskeletal: Positive for arthralgias, joint pain, myalgias, morning stiffness, muscle tenderness and myalgias. Negative for joint swelling and muscle weakness.  Skin: Positive for rash. Negative for color change and redness.  Allergic/Immunologic: Negative for susceptible to infections.  Neurological: Positive for dizziness and headaches. Negative for numbness and weakness.  Hematological: Negative for swollen glands.  Psychiatric/Behavioral: Positive for sleep disturbance.    PMFS History:  Patient Active Problem List   Diagnosis Date Noted  . Syncope 03/20/2020  . Positive ANA (antinuclear antibody) 03/13/2020  . Idiopathic chronic gout, unspecified site, without tophus (tophi) 03/13/2020  . Chronic right shoulder pain 03/13/2020  . Systolic heart failure ([place])   . STD (sexually transmitted disease)   . Migraine   . Infertility, female   . Hirsutism   . Hepatomegaly   . Hepatic steatosis   . GERD (gastroesophageal reflux disease)   . Depression   . Chest pain   . Amenorrhea   . Abnormal Pap smear of cervix   . Gestational diabetes mellitus (GDM) in second trimester 01/15/2020  . Advanced maternal age, 1st pregnancy 12/12/2019  . Congestive heart failure ([place]) 12/12/2019  . Renal mass 12/12/2019  . Colchicine overdose 05/29/2019  . OSA on CPAP 02/21/2018  . Conductive hearing loss of left ear with unrestricted hearing of right ear 11/02/2017  . Impacted cerumen of left ear 11/02/2017  . Snoring 08/17/2016  . Daytime sleepiness 08/17/2016  .  Diverticulitis of large intestine 12/30/2015  . Hypotension 08/20/2014  . Chronic diastolic CHF (congestive heart failure) ([place]) 11/13/2013  . HTN (hypertension) 03/20/2013    Class: Chronic  . Morbid obesity ([place]) 03/20/2013  . Polycystic ovary syndrome 03/20/2013    Class: Chronic  . Accelerated essential hypertension  03/20/2013    Past Medical History:  Diagnosis Date  . Abnormal Pap smear of cervix   . Accelerated essential hypertension 03/20/2013   Left ventricular systolic dysfunction as a complication   . Advanced maternal age, 1st pregnancy 12/12/2019   Formatting of this note might be different from the original. Dating: 06/03/2020 by U/S                                             First trimester:  [ ]  Prenatal labs reviewed  [ ]  SMA/Hgb electrophoresis/CF   [ ]  Early 1 hr GTT*  [ ]  Baseline HELLP* [ ]  LDASA* [ ]  TB screening* [ ]  Medicaid patients: Pregnancy Medical Home Risk Screen (Code [phone])* Third trimester:  [ ]  CBC/RPR/HIV [ ]  1hr GTT [ ]   . Amenorrhea   . Chest pain    Coronary CTA 04/2019: Calcium score 0; no evidence of CAD  . Chronic combined systolic and diastolic heart failure ([place]) 11/13/2013   LVEF improved from 25% to 45% after 6 months of antihypertensive therapy   . Colchicine overdose 05/29/2019  . Conductive hearing loss of left ear with unrestricted hearing of right ear 11/02/2017  . Congestive heart failure ([place]) 12/12/2019   Last Assessment & Plan:  Formatting of this note might be different from the original. No new symptoms. Is continued labetalol 200 mg BID with good effect.  . Daytime sleepiness 08/17/2016  . Depression   . Diverticulitis of large intestine 12/30/2015  . GERD (gastroesophageal reflux disease)   . Gestational diabetes mellitus (GDM) in second trimester 01/15/2020   Last Assessment & Plan:  Formatting of this note might be different from the original. Patient failed 1hr gtt at 175 and then failed 3 hr gtt. She has been trying medical nutrition therapy over the last few weeks with good effect although she does not have a log with her today. I discussed with Ms. Tacey whether these labs results indicated a diagnosis of gestational diabetes or preexisting diabe  . Gout   . Hepatic steatosis   . Hepatomegaly   . Hirsutism   . HTN (hypertension)   . Hypotension  08/20/2014  . Impacted cerumen of left ear 11/02/2017  . Infertility, female   . Migraine   . Morbid obesity ([place]) 03/20/2013  . OSA on CPAP 02/21/2018   Home sleep study showed severe obstructive sleep apnea with an AHI of 62.3 on night 1 and 92.0 and night to with oxygen desaturations less than 70% for 1 minute.  A total of 1 hour and 22 minutes was noted with O2 sats less than 89%.  52% of the time was spent with oxygen saturations less than 90%. Now on CPAP at 14 cm H2O   . Polycystic ovary syndrome 03/20/2013  . Renal mass   . Snoring 08/17/2016  . STD (sexually transmitted disease)    HSV (genital)  . Systolic heart failure (HCC)     Family History  Problem Relation Age of Onset  . Hypertension Mother   . Hypertension Father   .  Thyroid disease Father   . Hypertension Sister   . Cancer Maternal Grandmother        ovarian or colon  . Diabetes Paternal Grandfather    Past Surgical History:  Procedure Laterality Date  . COLPOSCOPY  2015  . D&C  01/23/2020  . no surgical hx     Social History   Social History Narrative  . Not on file   Immunization History  Administered Date(s) Administered  . Influenza-Unspecified 04/09/2017  . PFIZER SARS-COV-2 Vaccination 08/11/2019, 08/31/2019  . Tdap 05/23/2012     Objective: Vital Signs: BP 113/76 (BP Location: Right Arm, Patient Position: Sitting, Cuff Size: Small)   Pulse 83   Ht 5\' 5"  (1.651 m)   Wt 227 lb (103 kg)   LMP 08/22/2019 (Approximate)   BMI 37.77 kg/m    Physical Exam HENT:     Right Ear: External ear normal.     Left Ear: External ear normal.  Skin:    General: Skin is warm and dry.     Findings: No rash.  Neurological:     Mental Status: She is alert.     Musculoskeletal Exam:  Elbow, wrist, fingers full range of motion no tenderness or swelling Knees, ankles, full range of motion no tenderness or swelling No MTP squeeze tenderness  CDAI Exam: CDAI Score: -- Patient Global: --; Provider Global:  -- Swollen: --; Tender: -- Joint Exam 04/11/2020   No joint exam has been documented for this visit   There is currently no information documented on the homunculus. Go to the Rheumatology activity and complete the homunculus joint exam.  Investigation: No additional findings.  Imaging: DG Chest 2 View  Result Date: 03/20/2020 CLINICAL DATA:  Chest pressure. EXAM: CHEST - 2 VIEW COMPARISON:  05/29/2019 FINDINGS: The heart size and mediastinal contours are within normal limits. Both lungs are clear. No pleural effusions or pneumothorax. The visualized skeletal structures are unremarkable. IMPRESSION: No acute cardiopulmonary disease. Electronically Signed   By: Margaretha Sheffield MD   On: 03/20/2020 09:12   CT Head Wo Contrast  Result Date: 03/20/2020 CLINICAL DATA:  Headache. Intracranial hemorrhage suspected. Head trauma. Syncope. EXAM: CT HEAD WITHOUT CONTRAST TECHNIQUE: Contiguous axial images were obtained from the base of the skull through the vertex without intravenous contrast. COMPARISON:  None. FINDINGS: Brain: No evidence of acute large vascular territory infarction, hemorrhage, hydrocephalus, extra-axial collection or mass lesion/mass effect. Vascular: No hyperdense vessel or unexpected calcification. Skull: No acute fracture. 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 Sinuses are otherwise clear. Unremarkable orbits. Other: No mastoid effusions. IMPRESSION: No evidence of acute intracranial abnormality. Electronically Signed   By: Margaretha Sheffield MD   On: 03/20/2020 10:16    Recent Labs: Lab Results  Component Value Date   WBC 6.5 03/21/2020   HGB 12.4 03/21/2020   PLT 275 03/21/2020   NA 139 03/21/2020   K 4.0 03/21/2020   CL 105 03/21/2020   CO2 24 03/21/2020   GLUCOSE 117 (H) 03/21/2020   BUN 12 03/21/2020   CREATININE 1.09 (H) 03/21/2020   BILITOT 1.2 03/21/2020   ALKPHOS 44 03/21/2020   AST 16 03/21/2020   ALT 24 03/21/2020   PROT  6.9 03/21/2020   ALBUMIN 3.8 03/21/2020   CALCIUM 9.4 03/21/2020   GFRAA 98 02/15/2020    Speciality Comments: No specialty comments available.  Procedures:  No procedures performed Allergies: Fish allergy and Iodine   Assessment / Plan:  Visit Diagnoses: Idiopathic chronic gout of right foot without tophus - Plan: Uric acid, CBC with Differential/Platelet, COMPLETE METABOLIC PANEL WITH GFR  She has no interval flares of gout or apparent side effects on increased allopurinol dose.  Will repeat CBC and CMP for medication monitoring.  Repeat uric acid remains above 6 we will plan to increase allopurinol to 300 mg and follow-up in 4 weeks.  If he is at goal we will stay on the current dose follow-up would be in 2 months on account of following up the other abnormal labs.  Positive ANA (antinuclear antibody)  ENA panel was negative and clinically does not appear to have been underlying autoimmune disease.  She does have positive lupus anticoagulant test we will need to repeat confirmatory testing in at least 8 weeks from now.  If positive would manage this as a primary condition not secondary to other autoimmune disease.  Orders: Orders Placed This Encounter  Procedures  . Uric acid  . CBC with Differential/Platelet  . COMPLETE METABOLIC PANEL WITH GFR   No orders of the defined types were placed in this encounter.   Follow-Up Instructions: No follow-ups on file.   Collier Salina, MD  Note - This record has been created using [employer].  Chart creation errors have been sought, but may not always  have been located. Such creation errors do not reflect on  the standard of medical care.

## 2020-04-12 LAB — COMPLETE METABOLIC PANEL WITH GFR
AG Ratio: 1.6 (calc) (ref 1.0–2.5)
ALT: 21 U/L (ref 6–29)
AST: 13 U/L (ref 10–30)
Albumin: 4.7 g/dL (ref 3.6–5.1)
Alkaline phosphatase (APISO): 52 U/L (ref 31–125)
BUN: 17 mg/dL (ref 7–25)
CO2: 27 mmol/L (ref 20–32)
Calcium: 9.8 mg/dL (ref 8.6–10.2)
Chloride: 102 mmol/L (ref 98–110)
Creat: 0.94 mg/dL (ref 0.50–1.10)
GFR, Est African American: 89 mL/min/{1.73_m2} (ref >=60)
GFR, Est Non African American: 76 mL/min/{1.73_m2} (ref >=60)
Globulin: 3 g/dL (calc) (ref 1.9–3.7)
Glucose, Bld: 82 mg/dL (ref 65–99)
Potassium: 5 mmol/L (ref 3.5–5.3)
Sodium: 137 mmol/L (ref 135–146)
Total Bilirubin: 0.8 mg/dL (ref 0.2–1.2)
Total Protein: 7.7 g/dL (ref 6.1–8.1)

## 2020-04-12 LAB — CBC WITH DIFFERENTIAL/PLATELET
Absolute Monocytes: 462 cells/uL (ref 200–950)
Basophils Absolute: 12 cells/uL (ref 0–200)
Basophils Relative: 0.2 %
Eosinophils Absolute: 18 cells/uL (ref 15–500)
Eosinophils Relative: 0.3 %
HCT: 41 % (ref 35.0–45.0)
Hemoglobin: 13.5 g/dL (ref 11.7–15.5)
Lymphs Abs: 2982 cells/uL (ref 850–3900)
MCH: 28 pg (ref 27.0–33.0)
MCHC: 32.9 g/dL (ref 32.0–36.0)
MCV: 84.9 fL (ref 80.0–100.0)
MPV: 9.3 fL (ref 7.5–12.5)
Monocytes Relative: 7.7 %
Neutro Abs: 2526 cells/uL (ref 1500–7800)
Neutrophils Relative %: 42.1 %
Platelets: 284 10*3/uL (ref 140–400)
RBC: 4.83 10*6/uL (ref 3.80–5.10)
RDW: 14.7 % (ref 11.0–15.0)
Total Lymphocyte: 49.7 %
WBC: 6 10*3/uL (ref 3.8–10.8)

## 2020-04-12 LAB — URIC ACID: Uric Acid, Serum: 6.2 mg/dL (ref 2.5–7.0)

## 2020-04-12 MED ORDER — ALLOPURINOL 300 MG PO TABS: 300.0000 mg | ORAL_TABLET | Freq: Every day | ORAL | 0 refills | Status: DC

## 2020-04-12 NOTE — Addendum Note (Signed)
Addended by: Collier Salina on: 04/12/2020 02:28 PM   Modules accepted: Orders

## 2020-04-12 NOTE — Progress Notes (Signed)
Renee Roberson her CBC and CMP look normal no problem with the allopurinol. Her uric acid remains above the goal of 6. She should increase to taking 300mg  daily and I have sent a new prescription with the 300mg  strength tablets to her pharmacy. We will need to schedule follow up in 4 weeks to repeat these tests.

## 2020-04-17 ENCOUNTER — Telehealth: Payer: Self-pay | Admitting: Radiology

## 2020-04-17 DIAGNOSIS — Z79899 Other long term (current) drug therapy: Secondary | ICD-10-CM | POA: Insufficient documentation

## 2020-04-17 NOTE — Telephone Encounter (Signed)
Advised patient, per Dr. Benjamine Mola to repeat routine Allopurinol labs in 4 weeks and schedule a follow-up appointment for January 2022, which at that time patient will be re-tested for rheumatology labs. Patient expressed understanding. Follow-up visit scheduled for 06/13/2020 and patient plans to come to our office for labs in 4 weeks.

## 2020-04-17 NOTE — Addendum Note (Signed)
Addended by: Collier Salina on: 04/17/2020 03:32 PM   Modules accepted: Orders

## 2020-05-12 ENCOUNTER — Other Ambulatory Visit: Payer: Self-pay | Admitting: Internal Medicine

## 2020-05-12 DIAGNOSIS — M1A0710 Idiopathic chronic gout, right ankle and foot, without tophus (tophi): Secondary | ICD-10-CM

## 2020-05-22 ENCOUNTER — Other Ambulatory Visit: Payer: Self-pay | Admitting: Internal Medicine

## 2020-05-22 DIAGNOSIS — M1A0710 Idiopathic chronic gout, right ankle and foot, without tophus (tophi): Secondary | ICD-10-CM

## 2020-05-30 ENCOUNTER — Ambulatory Visit: Payer: BC Managed Care – PPO | Admitting: Psychology

## 2020-06-10 ENCOUNTER — Ambulatory Visit (INDEPENDENT_AMBULATORY_CARE_PROVIDER_SITE_OTHER): Payer: BC Managed Care – PPO | Admitting: Psychology

## 2020-06-10 DIAGNOSIS — F322 Major depressive disorder, single episode, severe without psychotic features: Secondary | ICD-10-CM | POA: Diagnosis not present

## 2020-06-12 NOTE — Progress Notes (Addendum)
Office Visit Note  Patient: Renee Roberson             Date of Birth: September 12, 1980           MRN: [id number]             PCP: Donald Prose, MD Referring: Donald Prose, MD Visit Date: 06/13/2020   Subjective:  Follow-up (Patient complains of rashes in multiple locations: right upper arm, right breast, right leg. Patient is taking Allopurinol 300 mg daily and gout seems to be well controlled with current regimen. Patient also complains of bilateral shoulder pain and numbness. )   History of Present Illness: Renee Roberson is a 40 y.o. female here for follow up of gout on allopurinol 300mg  PO daily increased two months ago. She has no episodes of gout attack since the last visit. She complains of left worse than right shoulder pain with numbness radiating down the arms that occurs intermittently, often worse lying in bed and with some movements. She has also noticed a few small, itchy round rashes on her arms, thigh and chest. Otherwise no major medical changes.   Review of Systems  Constitutional: Positive for fatigue.  HENT: Negative for mouth sores, mouth dryness and nose dryness.   Eyes: Positive for itching. Negative for pain, visual disturbance and dryness.  Respiratory: Negative for cough, hemoptysis, shortness of breath and difficulty breathing.   Cardiovascular: Positive for chest pain and palpitations. Negative for swelling in legs/feet.  Gastrointestinal: Positive for constipation. Negative for abdominal pain, blood in stool and diarrhea.  Endocrine: Negative for increased urination.  Genitourinary: Negative for painful urination.  Musculoskeletal: Positive for arthralgias, joint pain, myalgias, morning stiffness and myalgias. Negative for joint swelling, muscle weakness and muscle tenderness.  Skin: Positive for rash. Negative for color change and redness.  Allergic/Immunologic: Negative for susceptible to infections.  Neurological: Positive for dizziness, numbness,  headaches and memory loss. Negative for weakness.  Hematological: Negative for swollen glands.  Psychiatric/Behavioral: Positive for confusion and sleep disturbance.    PMFS History:  Patient Active Problem List   Diagnosis Date Noted  . High risk medication use 04/17/2020  . Syncope 03/20/2020  . Positive ANA (antinuclear antibody) 03/13/2020  . Idiopathic chronic gout, unspecified site, without tophus (tophi) 03/13/2020  . Chronic right shoulder pain 03/13/2020  . Systolic heart failure ([place])   . STD (sexually transmitted disease)   . Migraine   . Infertility, female   . Hirsutism   . Hepatomegaly   . Hepatic steatosis   . GERD (gastroesophageal reflux disease)   . Depression   . Chest pain   . Amenorrhea   . Abnormal Pap smear of cervix   . Gestational diabetes mellitus (GDM) in second trimester 01/15/2020  . Advanced maternal age, 1st pregnancy 12/12/2019  . Congestive heart failure ([place]) 12/12/2019  . Renal mass 12/12/2019  . Colchicine overdose 05/29/2019  . OSA on CPAP 02/21/2018  . Conductive hearing loss of left ear with unrestricted hearing of right ear 11/02/2017  . Impacted cerumen of left ear 11/02/2017  . Snoring 08/17/2016  . Daytime sleepiness 08/17/2016  . Diverticulitis of large intestine 12/30/2015  . Hypotension 08/20/2014  . Chronic diastolic CHF (congestive heart failure) ([place]) 11/13/2013  . HTN (hypertension) 03/20/2013    Class: Chronic  . Morbid obesity ([place]) 03/20/2013  . Polycystic ovary syndrome 03/20/2013    Class: Chronic  . Accelerated essential hypertension 03/20/2013    Past Medical History:  Diagnosis Date  .  Abnormal Pap smear of cervix   . Accelerated essential hypertension 03/20/2013   Left ventricular systolic dysfunction as a complication   . Advanced maternal age, 1st pregnancy 12/12/2019   Formatting of this note might be different from the original. Dating: 06/03/2020 by U/S                                             First  trimester:  [ ]  Prenatal labs reviewed  [ ]  SMA/Hgb electrophoresis/CF   [ ]  Early 1 hr GTT*  [ ]  Baseline HELLP* [ ]  LDASA* [ ]  TB screening* [ ]  Medicaid patients: Pregnancy Medical Home Risk Screen (Code [phone])* Third trimester:  [ ]  CBC/RPR/HIV [ ]  1hr GTT [ ]   . Amenorrhea   . Chest pain    Coronary CTA 04/2019: Calcium score 0; no evidence of CAD  . Chronic combined systolic and diastolic heart failure ([place]) 11/13/2013   LVEF improved from 25% to 45% after 6 months of antihypertensive therapy   . Colchicine overdose 05/29/2019  . Conductive hearing loss of left ear with unrestricted hearing of right ear 11/02/2017  . Congestive heart failure ([place]) 12/12/2019   Last Assessment & Plan:  Formatting of this note might be different from the original. No new symptoms. Is continued labetalol 200 mg BID with good effect.  . Daytime sleepiness 08/17/2016  . Depression   . Diverticulitis of large intestine 12/30/2015  . GERD (gastroesophageal reflux disease)   . Gestational diabetes mellitus (GDM) in second trimester 01/15/2020   Last Assessment & Plan:  Formatting of this note might be different from the original. Patient failed 1hr gtt at 175 and then failed 3 hr gtt. She has been trying medical nutrition therapy over the last few weeks with good effect although she does not have a log with her today. I discussed with Ms. Lheureux whether these labs results indicated a diagnosis of gestational diabetes or preexisting diabe  . Gout   . Hepatic steatosis   . Hepatomegaly   . Hirsutism   . HTN (hypertension)   . Hypotension 08/20/2014  . Impacted cerumen of left ear 11/02/2017  . Infertility, female   . Migraine   . Morbid obesity ([place]) 03/20/2013  . OSA on CPAP 02/21/2018   Home sleep study showed severe obstructive sleep apnea with an AHI of 62.3 on night 1 and 92.0 and night to with oxygen desaturations less than 70% for 1 minute.  A total of 1 hour and 22 minutes was noted with O2 sats less than 89%.   52% of the time was spent with oxygen saturations less than 90%. Now on CPAP at 14 cm H2O   . Polycystic ovary syndrome 03/20/2013  . Renal mass   . Snoring 08/17/2016  . STD (sexually transmitted disease)    HSV (genital)  . Systolic heart failure ([facility]CC)     Family History  Problem Relation Age of Onset  . Hypertension Mother   . Hypertension Father   . Thyroid disease Father   . Hypertension Sister   . Cancer Maternal Grandmother        ovarian or colon  . Diabetes Paternal Grandfather    Past Surgical History:  Procedure Laterality Date  . COLPOSCOPY  2015  . D&C  01/23/2020  . no surgical hx     Social History   Social  History Narrative  . Not on file   Immunization History  Administered Date(s) Administered  . Influenza-Unspecified 04/09/2017  . PFIZER SARS-COV-2 Vaccination 08/11/2019, 08/31/2019, 04/13/2020  . Tdap 05/23/2012     Objective: Vital Signs: BP 117/77 (BP Location: Left Arm, Patient Position: Sitting, Cuff Size: Normal)   Pulse 83   Ht 5\' 5"  (1.651 m)   Wt 232 lb 3.2 oz (105.3 kg)   LMP 08/22/2019 (Approximate)   BMI 38.64 kg/m    Physical Exam Constitutional:      Appearance: She is obese.  HENT:     Right Ear: External ear normal.     Left Ear: External ear normal.  Eyes:     Conjunctiva/sclera: Conjunctivae normal.  Skin:    General: Skin is warm and dry.     Comments: Few small 1cm or less diameter erythematous or hyperpigmented flat lesions on right forearm and chest  Neurological:     Mental Status: She is alert.     Musculoskeletal Exam:  Neck full range of motion no tenderness Shoulders full ROM, some discomfort with full overhead abduction and with extension plus internal rotation Normal elbow, wrist, fingers ROM no swelling or tenderenss  Investigation: No additional findings.  Imaging: No results found.  Recent Labs: Lab Results  Component Value Date   WBC 6.0 04/11/2020   HGB 13.5 04/11/2020   PLT 284 04/11/2020    NA 137 04/11/2020   K 5.0 04/11/2020   CL 102 04/11/2020   CO2 27 04/11/2020   GLUCOSE 82 04/11/2020   BUN 17 04/11/2020   CREATININE 0.94 04/11/2020   BILITOT 0.8 04/11/2020   ALKPHOS 44 03/21/2020   AST 13 04/11/2020   ALT 21 04/11/2020   PROT 7.7 04/11/2020   ALBUMIN 3.8 03/21/2020   CALCIUM 9.8 04/11/2020   GFRAA 89 04/11/2020    Speciality Comments: No specialty comments available.  Procedures:  No procedures performed Allergies: Fish allergy and Iodine   Assessment / Plan:     Visit Diagnoses: Idiopathic chronic gout of right foot without tophus - Plan: Uric acid  Last episode was right foot inflammation, currently no flares on allopurinol 300mg  now so we will check uric acid level if at goal continue current dose otherwise titration.  High risk medication use - Plan: CBC with Differential/Platelet, COMPLETE METABOLIC PANEL WITH GFR  Will check CBC and CMP for drug monitoring after allopurinol dose increase.  Positive ANA (antinuclear antibody)  I do not see evidence of inflammatory joint changes. Current skin rash is not typical for ANA related disease. Do not suspect this is related to an active autoimmune disease.  Impingement syndrome of left shoulder  Left shoulder pain seems consistent with impingement at the shoulder level. No specific nerve distribution in hand symptoms. No neck pain or paraspinal spasticity to really suggest cervical disease. Recommend stretching and exercises list provided in clinic.  Orders: Orders Placed This Encounter  Procedures  . Uric acid  . CBC with Differential/Platelet  . COMPLETE METABOLIC PANEL WITH GFR   No orders of the defined types were placed in this encounter.    Follow-Up Instructions: Return in about 4 weeks (around 07/11/2020) for Gout also shoulder pain.   Collier Salina, MD  Note - This record has been created using [employer].  Chart creation errors have been sought, but may not always  have  been located. Such creation errors do not reflect on  the standard of medical care.

## 2020-06-13 ENCOUNTER — Encounter: Payer: Self-pay | Admitting: Internal Medicine

## 2020-06-13 ENCOUNTER — Other Ambulatory Visit: Payer: Self-pay

## 2020-06-13 ENCOUNTER — Ambulatory Visit (INDEPENDENT_AMBULATORY_CARE_PROVIDER_SITE_OTHER): Payer: BC Managed Care – PPO | Admitting: Internal Medicine

## 2020-06-13 VITALS — BP 117/77 | HR 83 | Ht 65.0 in | Wt 232.2 lb

## 2020-06-13 DIAGNOSIS — R768 Other specified abnormal immunological findings in serum: Secondary | ICD-10-CM | POA: Diagnosis not present

## 2020-06-13 DIAGNOSIS — M7542 Impingement syndrome of left shoulder: Secondary | ICD-10-CM

## 2020-06-13 DIAGNOSIS — M1A0710 Idiopathic chronic gout, right ankle and foot, without tophus (tophi): Secondary | ICD-10-CM | POA: Diagnosis not present

## 2020-06-13 DIAGNOSIS — Z79899 Other long term (current) drug therapy: Secondary | ICD-10-CM | POA: Diagnosis not present

## 2020-06-13 NOTE — Patient Instructions (Signed)
We will need to recheck your blood work for the gout treatment and make sure the allopurinol is not causing any problems.  Your shoulder pain seems to be impingement syndrome and recommend treating this with stretching and strengthening exercises  Shoulder Impingement Syndrome  Shoulder impingement syndrome is a condition that causes pain when connective tissues (tendons) surrounding the shoulder joint become pinched. These tendons are part of the group of muscles and tissues that help to stabilize the shoulder (rotator cuff). Beneath the rotator cuff is a fluid-filled sac (bursa) that allows the muscles and tendons to glide smoothly. The bursa may become swollen or irritated (bursitis). Bursitis, swelling in the rotator cuff tendons, or both conditions can decrease how much space is under a bone in the shoulder joint (acromion), resulting in impingement. What are the causes? Shoulder impingement syndrome may be caused by bursitis or swelling of the rotator cuff tendons, which may result from:  Repetitive overhead arm movements.  Falling onto the shoulder.  Weakness in the shoulder muscles. What increases the risk? You may be more likely to develop this condition if you:  Play sports that involve throwing, such as baseball.  Participate in sports such as tennis, volleyball, and swimming.  Work as a Curator, Games developer, or Architect. Some people are also more likely to develop impingement syndrome because of the shape of their acromion bone. What are the signs or symptoms? The main symptom of this condition is pain on the front or side of the shoulder. The pain may:  Get worse when lifting or raising the arm.  Get worse at night.  Wake you up from sleeping.  Feel sharp when the shoulder is moved and then fade to an ache. Other symptoms may include:  Tenderness.  Stiffness.  Inability to raise the arm above shoulder level or behind the body.  Weakness. How is this  diagnosed? This condition may be diagnosed based on:  Your symptoms and medical history.  A physical exam.  Imaging tests, such as: ? X-rays. ? MRI. ? Ultrasound. How is this treated? This condition may be treated by:  Resting your shoulder and avoiding all activities that cause pain or put stress on the shoulder.  Icing your shoulder.  NSAIDs to help reduce pain and swelling.  One or more injections of medicines to numb the area and reduce inflammation.  Physical therapy.  Surgery. This may be needed if nonsurgical treatments have not helped. Surgery may involve repairing the rotator cuff, reshaping the acromion, or removing the bursa. Follow these instructions at home: Managing pain, stiffness, and swelling  If directed, put ice on the injured area. ? Put ice in a plastic bag. ? Place a towel between your skin and the bag. ? Leave the ice on for 20 minutes, 2-3 times a day.   Activity  Rest and return to your normal activities as told by your health care provider. Ask your health care provider what activities are safe for you.  Do exercises as told by your health care provider. General instructions  Do not use any products that contain nicotine or tobacco, such as cigarettes, e-cigarettes, and chewing tobacco. These can delay healing. If you need help quitting, ask your health care provider.  Ask your health care provider when it is safe for you to drive.  Take over-the-counter and prescription medicines only as told by your health care provider.  Keep all follow-up visits as told by your health care provider. This is important. How is this  prevented?  Give your body time to rest between periods of activity.  Be safe and responsible while being active. This will help you avoid falls.  Maintain physical fitness, including strength and flexibility. Contact a health care provider if:  Your symptoms have not improved after 1-2 months of treatment and rest.  You  cannot lift your arm away from your body. Summary  Shoulder impingement syndrome is a condition that causes pain when connective tissues (tendons) surrounding the shoulder joint become pinched.  The main symptom of this condition is pain on the front or side of the shoulder.  This condition is usually treated with rest, ice, and pain medicines as needed.

## 2020-06-14 ENCOUNTER — Telehealth: Payer: Self-pay

## 2020-06-14 DIAGNOSIS — M25511 Pain in right shoulder: Secondary | ICD-10-CM

## 2020-06-14 DIAGNOSIS — M7542 Impingement syndrome of left shoulder: Secondary | ICD-10-CM

## 2020-06-14 DIAGNOSIS — G8929 Other chronic pain: Secondary | ICD-10-CM

## 2020-06-14 LAB — COMPLETE METABOLIC PANEL WITH GFR
AG Ratio: 1.4 (calc) (ref 1.0–2.5)
ALT: 21 U/L (ref 6–29)
AST: 15 U/L (ref 10–30)
Albumin: 4.7 g/dL (ref 3.6–5.1)
Alkaline phosphatase (APISO): 46 U/L (ref 31–125)
BUN: 14 mg/dL (ref 7–25)
CO2: 27 mmol/L (ref 20–32)
Calcium: 9.9 mg/dL (ref 8.6–10.2)
Chloride: 103 mmol/L (ref 98–110)
Creat: 0.78 mg/dL (ref 0.50–1.10)
GFR, Est African American: 111 mL/min/{1.73_m2} (ref >=60)
GFR, Est Non African American: 96 mL/min/{1.73_m2} (ref >=60)
Globulin: 3.3 g/dL (calc) (ref 1.9–3.7)
Glucose, Bld: 90 mg/dL (ref 65–99)
Potassium: 4.2 mmol/L (ref 3.5–5.3)
Sodium: 139 mmol/L (ref 135–146)
Total Bilirubin: 0.8 mg/dL (ref 0.2–1.2)
Total Protein: 8 g/dL (ref 6.1–8.1)

## 2020-06-14 LAB — CBC WITH DIFFERENTIAL/PLATELET
Absolute Monocytes: 445 cells/uL (ref 200–950)
Basophils Absolute: 21 cells/uL (ref 0–200)
Basophils Relative: 0.4 %
Eosinophils Absolute: 58 cells/uL (ref 15–500)
Eosinophils Relative: 1.1 %
HCT: 39.1 % (ref 35.0–45.0)
Hemoglobin: 13.2 g/dL (ref 11.7–15.5)
Lymphs Abs: 2661 cells/uL (ref 850–3900)
MCH: 28.8 pg (ref 27.0–33.0)
MCHC: 33.8 g/dL (ref 32.0–36.0)
MCV: 85.2 fL (ref 80.0–100.0)
MPV: 9.3 fL (ref 7.5–12.5)
Monocytes Relative: 8.4 %
Neutro Abs: 2115 cells/uL (ref 1500–7800)
Neutrophils Relative %: 39.9 %
Platelets: 287 10*3/uL (ref 140–400)
RBC: 4.59 10*6/uL (ref 3.80–5.10)
RDW: 14.8 % (ref 11.0–15.0)
Total Lymphocyte: 50.2 %
WBC: 5.3 10*3/uL (ref 3.8–10.8)

## 2020-06-14 LAB — URIC ACID: Uric Acid, Serum: 6.4 mg/dL (ref 2.5–7.0)

## 2020-06-14 MED ORDER — ALLOPURINOL 100 MG PO TABS: 100.0000 mg | ORAL_TABLET | Freq: Every day | ORAL | 0 refills | Status: DC

## 2020-06-14 MED ORDER — ALLOPURINOL 300 MG PO TABS: 300.0000 mg | ORAL_TABLET | Freq: Every day | ORAL | 0 refills | Status: DC

## 2020-06-14 MED ORDER — TIZANIDINE HCL 4 MG PO TABS: 4.0000 mg | ORAL_TABLET | Freq: Four times a day (QID) | ORAL | 1 refills | Status: DC | PRN

## 2020-06-14 NOTE — Telephone Encounter (Signed)
She has previously taken tizanidine 4mg  as needed for muscle spasm and pain which was tolerated okay. Not previously from our office but is relevant to the current problems we are seeing so no problems prescribing. New Rx sent to pharmacy.

## 2020-06-14 NOTE — Telephone Encounter (Signed)
Attempted to contact patient and left message on machine to advise patient that a prescription for tizanidine has been sent to the pharmacy.

## 2020-06-14 NOTE — Addendum Note (Signed)
Addended by: Collier Salina on: 06/14/2020 09:09 AM   Modules accepted: Orders

## 2020-06-14 NOTE — Telephone Encounter (Signed)
Patient requested a prescription for tizanidine. Patient states she was previously taking the medication and forgot to ask at her appointment yesterday. Please advise. If you approve, please send rx to [facility] on [address].

## 2020-06-14 NOTE — Progress Notes (Signed)
Lab tests show uric acid 6.4 which is still above goal for optimal gout control. I recommend increasing medication dose to 400 mg, adding a smaller 100mg  tablet to the 300mg  tablet and new Rx sent for this. No problems with blood count or metabolic panel.

## 2020-06-19 ENCOUNTER — Ambulatory Visit (INDEPENDENT_AMBULATORY_CARE_PROVIDER_SITE_OTHER): Payer: BC Managed Care – PPO | Admitting: Psychology

## 2020-06-19 DIAGNOSIS — F322 Major depressive disorder, single episode, severe without psychotic features: Secondary | ICD-10-CM

## 2020-06-26 ENCOUNTER — Ambulatory Visit: Payer: BC Managed Care – PPO | Admitting: Psychology

## 2020-06-27 ENCOUNTER — Other Ambulatory Visit: Payer: Self-pay | Admitting: Internal Medicine

## 2020-07-01 ENCOUNTER — Ambulatory Visit (INDEPENDENT_AMBULATORY_CARE_PROVIDER_SITE_OTHER): Payer: BC Managed Care – PPO | Admitting: Psychologist

## 2020-07-01 DIAGNOSIS — F321 Major depressive disorder, single episode, moderate: Secondary | ICD-10-CM

## 2020-07-01 DIAGNOSIS — F4310 Post-traumatic stress disorder, unspecified: Secondary | ICD-10-CM | POA: Diagnosis not present

## 2020-07-04 ENCOUNTER — Other Ambulatory Visit: Payer: Self-pay | Admitting: Urology

## 2020-07-04 ENCOUNTER — Other Ambulatory Visit (HOSPITAL_COMMUNITY): Payer: Self-pay | Admitting: Urology

## 2020-07-04 DIAGNOSIS — D49511 Neoplasm of unspecified behavior of right kidney: Secondary | ICD-10-CM

## 2020-07-05 ENCOUNTER — Ambulatory Visit: Payer: BC Managed Care – PPO | Admitting: Psychology

## 2020-07-08 ENCOUNTER — Ambulatory Visit: Payer: BC Managed Care – PPO | Admitting: Psychologist

## 2020-07-09 ENCOUNTER — Other Ambulatory Visit: Payer: Self-pay | Admitting: Internal Medicine

## 2020-07-09 DIAGNOSIS — M1A0710 Idiopathic chronic gout, right ankle and foot, without tophus (tophi): Secondary | ICD-10-CM

## 2020-07-09 NOTE — Progress Notes (Deleted)
Office Visit Note  Patient: Renee Roberson             Date of Birth: 09-15-1980           MRN: [id number]             PCP: Donald Prose, MD Referring: Donald Prose, MD Visit Date: 07/10/2020   Subjective:  No chief complaint on file.   History of Present Illness: Renee Roberson is a 40 y.o. female here for follow up of gout on allopurinol 400mg  PO daily increased one months ago after uric acid remained above goal at 6.4. She has no episodes of gout attack since the last visit. She did request a muscle relaxant medication to help with muscular pain so reordered tizanidine.   No Rheumatology ROS completed.   PMFS History:  Patient Active Problem List   Diagnosis Date Noted  . High risk medication use 04/17/2020  . Syncope 03/20/2020  . Positive ANA (antinuclear antibody) 03/13/2020  . Idiopathic chronic gout, unspecified site, without tophus (tophi) 03/13/2020  . Chronic right shoulder pain 03/13/2020  . Systolic heart failure ([place])   . STD (sexually transmitted disease)   . Migraine   . Infertility, female   . Hirsutism   . Hepatomegaly   . Hepatic steatosis   . GERD (gastroesophageal reflux disease)   . Depression   . Chest pain   . Amenorrhea   . Abnormal Pap smear of cervix   . Gestational diabetes mellitus (GDM) in second trimester 01/15/2020  . Advanced maternal age, 1st pregnancy 12/12/2019  . Congestive heart failure ([place]) 12/12/2019  . Renal mass 12/12/2019  . Colchicine overdose 05/29/2019  . OSA on CPAP 02/21/2018  . Conductive hearing loss of left ear with unrestricted hearing of right ear 11/02/2017  . Impacted cerumen of left ear 11/02/2017  . Snoring 08/17/2016  . Daytime sleepiness 08/17/2016  . Diverticulitis of large intestine 12/30/2015  . Hypotension 08/20/2014  . Chronic diastolic CHF (congestive heart failure) ([employer]) 11/13/2013  . HTN (hypertension) 03/20/2013    Class: Chronic  . Morbid obesity ([place]) 03/20/2013  . Polycystic ovary  syndrome 03/20/2013    Class: Chronic  . Accelerated essential hypertension 03/20/2013    Past Medical History:  Diagnosis Date  . Abnormal Pap smear of cervix   . Accelerated essential hypertension 03/20/2013   Left ventricular systolic dysfunction as a complication   . Advanced maternal age, 1st pregnancy 12/12/2019   Formatting of this note might be different from the original. Dating: 06/03/2020 by U/S                                             First trimester:  [ ]  Prenatal labs reviewed  [ ]  SMA/Hgb electrophoresis/CF   [ ]  Early 1 hr GTT*  [ ]  Baseline HELLP* [ ]  LDASA* [ ]  TB screening* [ ]  Medicaid patients: Pregnancy Medical Home Risk Screen (Code [phone])* Third trimester:  [ ]  CBC/RPR/HIV [ ]  1hr GTT [ ]   . Amenorrhea   . Chest pain    Coronary CTA 04/2019: Calcium score 0; no evidence of CAD  . Chronic combined systolic and diastolic heart failure ([place]) 11/13/2013   LVEF improved from 25% to 45% after 6 months of antihypertensive therapy   . Colchicine overdose 05/29/2019  . Conductive hearing loss of left ear with unrestricted hearing  of right ear 11/02/2017  . Congestive heart failure ([place]) 12/12/2019   Last Assessment & Plan:  Formatting of this note might be different from the original. No new symptoms. Is continued labetalol 200 mg BID with good effect.  . Daytime sleepiness 08/17/2016  . Depression   . Diverticulitis of large intestine 12/30/2015  . GERD (gastroesophageal reflux disease)   . Gestational diabetes mellitus (GDM) in second trimester 01/15/2020   Last Assessment & Plan:  Formatting of this note might be different from the original. Patient failed 1hr gtt at 175 and then failed 3 hr gtt. She has been trying medical nutrition therapy over the last few weeks with good effect although she does not have a log with her today. I discussed with Ms. Mckinlay whether these labs results indicated a diagnosis of gestational diabetes or preexisting diabe  . Gout   . Hepatic steatosis    . Hepatomegaly   . Hirsutism   . HTN (hypertension)   . Hypotension 08/20/2014  . Impacted cerumen of left ear 11/02/2017  . Infertility, female   . Migraine   . Morbid obesity ([place]) 03/20/2013  . OSA on CPAP 02/21/2018   Home sleep study showed severe obstructive sleep apnea with an AHI of 62.3 on night 1 and 92.0 and night to with oxygen desaturations less than 70% for 1 minute.  A total of 1 hour and 22 minutes was noted with O2 sats less than 89%.  52% of the time was spent with oxygen saturations less than 90%. Now on CPAP at 14 cm H2O   . Polycystic ovary syndrome 03/20/2013  . Renal mass   . Snoring 08/17/2016  . STD (sexually transmitted disease)    HSV (genital)  . Systolic heart failure ([facility])     Family History  Problem Relation Age of Onset  . Hypertension Mother   . Hypertension Father   . Thyroid disease Father   . Hypertension Sister   . Cancer Maternal Grandmother        ovarian or colon  . Diabetes Paternal Grandfather    Past Surgical History:  Procedure Laterality Date  . COLPOSCOPY  2015  . D&C  01/23/2020  . no surgical hx     Social History   Social History Narrative  . Not on file   Immunization History  Administered Date(s) Administered  . Influenza-Unspecified 04/09/2017  . PFIZER(Purple Top)SARS-COV-2 Vaccination 08/11/2019, 08/31/2019, 04/13/2020  . Tdap 05/23/2012     Objective: Vital Signs: LMP 08/22/2019 (Approximate)    Physical Exam   Musculoskeletal Exam: ***  CDAI Exam: CDAI Score: -- Patient Global: --; Provider Global: -- Swollen: --; Tender: -- Joint Exam 07/10/2020   No joint exam has been documented for this visit   There is currently no information documented on the homunculus. Go to the Rheumatology activity and complete the homunculus joint exam.  Investigation: No additional findings.  Imaging: No results found.  Recent Labs: Lab Results  Component Value Date   WBC 5.3 06/13/2020   HGB 13.2 06/13/2020    PLT 287 06/13/2020   NA 139 06/13/2020   K 4.2 06/13/2020   CL 103 06/13/2020   CO2 27 06/13/2020   GLUCOSE 90 06/13/2020   BUN 14 06/13/2020   CREATININE 0.78 06/13/2020   BILITOT 0.8 06/13/2020   ALKPHOS 44 03/21/2020   AST 15 06/13/2020   ALT 21 06/13/2020   PROT 8.0 06/13/2020   ALBUMIN 3.8 03/21/2020   CALCIUM 9.9 06/13/2020  GFRAA 111 06/13/2020    Speciality Comments: No specialty comments available.  Procedures:  No procedures performed Allergies: Fish allergy and Iodine   Assessment / Plan:     Visit Diagnoses: No diagnosis found.  Orders: No orders of the defined types were placed in this encounter.  No orders of the defined types were placed in this encounter.   Face-to-face time spent with patient was *** minutes. Greater than 50% of time was spent in counseling and coordination of care.  Follow-Up Instructions: No follow-ups on file.   Collier Salina, MD  Note - This record has been created using [employer].  Chart creation errors have been sought, but may not always  have been located. Such creation errors do not reflect on  the standard of medical care.

## 2020-07-09 NOTE — Telephone Encounter (Signed)
Last Visit: 06/13/2020 Next Visit: 07/10/2020 Labs: 06/13/2020 Lab tests show uric acid 6.4 which is still above goal for optimal gout control. I recommend increasing medication dose to 400 mg, adding a smaller 100mg  tablet to the 300mg  tablet and new Rx sent for this. No problems with blood count or metabolic panel.  Current Dose per office note 06/13/2020: see above regarding dosage DX: Idiopathic chronic gout of right foot without tophus  Last Fill: 06/14/2020  Okay to refill Allopurinol?

## 2020-07-10 ENCOUNTER — Ambulatory Visit: Payer: BC Managed Care – PPO | Admitting: Internal Medicine

## 2020-07-11 ENCOUNTER — Ambulatory Visit (INDEPENDENT_AMBULATORY_CARE_PROVIDER_SITE_OTHER): Payer: BC Managed Care – PPO | Admitting: Psychologist

## 2020-07-11 ENCOUNTER — Other Ambulatory Visit: Payer: Self-pay | Admitting: Internal Medicine

## 2020-07-11 ENCOUNTER — Encounter: Payer: Self-pay | Admitting: Radiology

## 2020-07-11 DIAGNOSIS — F321 Major depressive disorder, single episode, moderate: Secondary | ICD-10-CM

## 2020-07-11 DIAGNOSIS — M1A0710 Idiopathic chronic gout, right ankle and foot, without tophus (tophi): Secondary | ICD-10-CM

## 2020-07-11 DIAGNOSIS — F4310 Post-traumatic stress disorder, unspecified: Secondary | ICD-10-CM | POA: Diagnosis not present

## 2020-07-11 MED ORDER — ALLOPURINOL 300 MG PO TABS
300.0000 mg | ORAL_TABLET | Freq: Every day | ORAL | 0 refills | Status: DC
Start: 2020-07-11 — End: 2020-11-29

## 2020-07-11 MED ORDER — ALLOPURINOL 100 MG PO TABS: 100.0000 mg | ORAL_TABLET | Freq: Every day | ORAL | 0 refills | Status: DC

## 2020-07-15 ENCOUNTER — Telehealth: Payer: Self-pay

## 2020-07-15 DIAGNOSIS — M109 Gout, unspecified: Secondary | ICD-10-CM | POA: Diagnosis not present

## 2020-07-15 DIAGNOSIS — Z23 Encounter for immunization: Secondary | ICD-10-CM | POA: Diagnosis not present

## 2020-07-15 NOTE — Telephone Encounter (Signed)
Patient called to let Dr. Benjamine Mola know that she saw her PCP about her gout flair in her right heel.  Patient is scheduled for follow-up appointment on Tuesday, 07/23/20 at 1:40 pm.

## 2020-07-15 NOTE — Telephone Encounter (Signed)
Patient's PCP note is not in Epic, I can reach out to see if they'll fax notes/if any labs were drawn. Please advise.

## 2020-07-16 NOTE — Telephone Encounter (Signed)
Spoke with patient, she had visit with Dr. Nancy Fetter on 07/15/2020 due to gout flare.   Per patient, they started her on Prednisone 20 mg daily. Patient is continuing on Allopurinol 400 mg. Per patient no labs were drawn.

## 2020-07-17 ENCOUNTER — Ambulatory Visit (HOSPITAL_COMMUNITY): Payer: BC Managed Care – PPO

## 2020-07-17 ENCOUNTER — Encounter (HOSPITAL_COMMUNITY): Payer: Self-pay

## 2020-07-18 ENCOUNTER — Ambulatory Visit: Payer: BC Managed Care – PPO | Admitting: Psychologist

## 2020-07-18 DIAGNOSIS — R519 Headache, unspecified: Secondary | ICD-10-CM | POA: Diagnosis not present

## 2020-07-18 DIAGNOSIS — N926 Irregular menstruation, unspecified: Secondary | ICD-10-CM | POA: Diagnosis not present

## 2020-07-18 DIAGNOSIS — S46819A Strain of other muscles, fascia and tendons at shoulder and upper arm level, unspecified arm, initial encounter: Secondary | ICD-10-CM | POA: Diagnosis not present

## 2020-07-18 DIAGNOSIS — M25512 Pain in left shoulder: Secondary | ICD-10-CM | POA: Diagnosis not present

## 2020-07-22 ENCOUNTER — Other Ambulatory Visit: Payer: Self-pay | Admitting: Internal Medicine
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
## 2020-07-23 ENCOUNTER — Encounter: Payer: Self-pay | Admitting: Internal Medicine

## 2020-07-23 ENCOUNTER — Ambulatory Visit (INDEPENDENT_AMBULATORY_CARE_PROVIDER_SITE_OTHER): Payer: BC Managed Care – PPO | Admitting: Internal Medicine

## 2020-07-23 ENCOUNTER — Other Ambulatory Visit: Payer: Self-pay

## 2020-07-23 VITALS — BP 131/83 | HR 85 | Resp 15 | Ht 65.0 in | Wt 238.0 lb

## 2020-07-23 DIAGNOSIS — M1A0710 Idiopathic chronic gout, right ankle and foot, without tophus (tophi): Secondary | ICD-10-CM

## 2020-07-23 DIAGNOSIS — M7532 Calcific tendinitis of left shoulder: Secondary | ICD-10-CM | POA: Diagnosis not present

## 2020-07-23 DIAGNOSIS — R760 Raised antibody titer: Secondary | ICD-10-CM | POA: Diagnosis not present

## 2020-07-23 MED ORDER — COLCHICINE 0.6 MG PO TABS: 0.6000 mg | ORAL_TABLET | Freq: Every day | ORAL | 0 refills | Status: DC

## 2020-07-23 NOTE — Patient Instructions (Signed)
I recommend continuing allopurinol 400mg  daily at this time, we will check uric acid level today if it is less than 6 no further increases are needed for now.  I recommend taking colchicine 1 tablet (0.6mg ) daily for the next 4 weeks on account of joint and tendon inflammation and reduces risk of gout flare for the short term while uric acid is just getting to the goal.  I suspect a tendon strain or sprain injury in the shoulder, I recommend resting this until 2 weeks after injury then exercising it with stretches and not weight lifting until 4 weeks after injury. Stretching needs to be at least 40 seconds per movement per day for improvement, should be enough to feel stretching without significant pain.

## 2020-07-25 ENCOUNTER — Ambulatory Visit: Payer: BC Managed Care – PPO | Admitting: Psychologist

## 2020-07-25 LAB — URIC ACID: Uric Acid, Serum: 4.1 mg/dL (ref 2.5–7.0)

## 2020-07-25 LAB — LUPUS ANTICOAGULANT EVAL W/ REFLEX
PTT-LA Screen: 27 s (ref <=40)
dRVVT: 33 s (ref <=45)

## 2020-07-25 NOTE — Progress Notes (Signed)
Uric acid level is 4.1, now well under the goal of 6. She should continue the current allopurinol dose 400mg  daily no new change needed.

## 2020-07-26 NOTE — Progress Notes (Signed)
Lupus anticoagulant test is negative, so negative for antiphospholipid antibodies on the repeat test and no new treatment needed. This may have been just a reactive positive test before.

## 2020-09-10 ENCOUNTER — Ambulatory Visit (HOSPITAL_COMMUNITY): Payer: BC Managed Care – PPO

## 2020-09-10 ENCOUNTER — Encounter (HOSPITAL_COMMUNITY): Payer: Self-pay

## 2020-09-25 ENCOUNTER — Other Ambulatory Visit: Payer: Self-pay | Admitting: Internal Medicine

## 2020-10-13 ENCOUNTER — Other Ambulatory Visit: Payer: Self-pay | Admitting: Internal Medicine

## 2020-10-13 DIAGNOSIS — M1A0710 Idiopathic chronic gout, right ankle and foot, without tophus (tophi): Secondary | ICD-10-CM

## 2020-10-14 DIAGNOSIS — N640 Fissure and fistula of nipple: Secondary | ICD-10-CM | POA: Diagnosis not present

## 2020-10-14 DIAGNOSIS — L309 Dermatitis, unspecified: Secondary | ICD-10-CM | POA: Diagnosis not present

## 2020-10-15 ENCOUNTER — Other Ambulatory Visit: Payer: Self-pay | Admitting: Family Medicine

## 2020-10-15 DIAGNOSIS — N2889 Other specified disorders of kidney and ureter: Secondary | ICD-10-CM

## 2020-10-22 ENCOUNTER — Ambulatory Visit: Payer: BC Managed Care – PPO | Admitting: Internal Medicine

## 2020-11-02 ENCOUNTER — Encounter: Payer: Self-pay | Admitting: Radiology

## 2020-11-02 ENCOUNTER — Ambulatory Visit
Admission: RE | Admit: 2020-11-02 | Discharge: 2020-11-02 | Disposition: A | Discharge status: Home / Self Care | Payer: BC Managed Care – PPO | Source: Ambulatory Visit | Attending: Family Medicine | Admitting: Family Medicine

## 2020-11-02 ENCOUNTER — Other Ambulatory Visit: Payer: Self-pay

## 2020-11-02 DIAGNOSIS — K760 Fatty (change of) liver, not elsewhere classified: Secondary | ICD-10-CM | POA: Diagnosis not present

## 2020-11-02 DIAGNOSIS — N2889 Other specified disorders of kidney and ureter: Secondary | ICD-10-CM | POA: Diagnosis not present

## 2020-11-02 DIAGNOSIS — K8020 Calculus of gallbladder without cholecystitis without obstruction: Secondary | ICD-10-CM | POA: Diagnosis not present

## 2020-11-02 DIAGNOSIS — K8080 Other cholelithiasis without obstruction: Secondary | ICD-10-CM | POA: Diagnosis not present

## 2020-11-02 MED ORDER — GADOBENATE DIMEGLUMINE 529 MG/ML IV SOLN
20.0000 mL | Freq: Once | INTRAVENOUS | Status: AC | PRN
  Administered 2020-11-02: 20 mL via INTRAVENOUS

## 2020-11-07 ENCOUNTER — Other Ambulatory Visit: Payer: Self-pay | Admitting: Internal Medicine

## 2020-11-07 DIAGNOSIS — M1A0710 Idiopathic chronic gout, right ankle and foot, without tophus (tophi): Secondary | ICD-10-CM

## 2020-11-07 NOTE — Telephone Encounter (Signed)
Spoke with patient, she will contact our office if she needs a refill. Follow-up appointment has been rescheduled for 11/11/2020.

## 2020-11-08 DIAGNOSIS — D49511 Neoplasm of unspecified behavior of right kidney: Secondary | ICD-10-CM | POA: Diagnosis not present

## 2020-11-10 NOTE — Progress Notes (Deleted)
Office Visit Note  Patient: Renee Roberson             Date of Birth: June 15, 1980           MRN: [id number]             PCP: Donald Prose, MD Referring: Donald Prose, MD Visit Date: 11/11/2020   Subjective:  No chief complaint on file.   History of Present Illness: Renee Roberson is a 40 y.o. female here for follow up ***     No Rheumatology ROS completed.   PMFS History:  Patient Active Problem List   Diagnosis Date Noted   Antiphospholipid antibody positive 07/23/2020   Calcific tendonitis of left shoulder 07/23/2020   High risk medication use 04/17/2020   Syncope 03/20/2020   Positive ANA (antinuclear antibody) 03/13/2020   Idiopathic chronic gout, unspecified site, without tophus (tophi) 03/13/2020   Chronic right shoulder pain 69/48/5462   Systolic heart failure ([place])    STD (sexually transmitted disease)    Migraine    Infertility, female    Hirsutism    Hepatomegaly    Hepatic steatosis    GERD (gastroesophageal reflux disease)    Depression    Chest pain    Amenorrhea    Abnormal Pap smear of cervix    Gestational diabetes mellitus (GDM) in second trimester 01/15/2020   Advanced maternal age, 1st pregnancy 12/12/2019   Congestive heart failure ([place]) 12/12/2019   Renal mass 12/12/2019   Colchicine overdose 05/29/2019   OSA on CPAP 02/21/2018   Conductive hearing loss of left ear with unrestricted hearing of right ear 11/02/2017   Impacted cerumen of left ear 11/02/2017   Snoring 08/17/2016   Daytime sleepiness 08/17/2016   Diverticulitis of large intestine 12/30/2015   Hypotension 08/20/2014   Chronic diastolic CHF (congestive heart failure) ([place]) 11/13/2013   HTN (hypertension) 03/20/2013    Class: Chronic   Morbid obesity ([place]) 03/20/2013   Polycystic ovary syndrome 03/20/2013    Class: Chronic   Accelerated essential hypertension 03/20/2013    Past Medical History:  Diagnosis Date   Abnormal Pap smear of cervix    Accelerated essential  hypertension 03/20/2013   Left ventricular systolic dysfunction as a complication    Advanced maternal age, 1st pregnancy 12/12/2019   Formatting of this note might be different from the original. Dating: 06/03/2020 by U/S                                             First trimester:  [ ]  Prenatal labs reviewed  [ ]  SMA/Hgb electrophoresis/CF   [ ]  Early 1 hr GTT*  [ ]  Baseline HELLP* [ ]  LDASA* [ ]  TB screening* [ ]  Medicaid patients: Pregnancy Medical Home Risk Screen (Code SO280)* Third trimester:  [ ]  CBC/RPR/HIV [ ]  1hr GTT [ ]    Amenorrhea    Chest pain    Coronary CTA 04/2019: Calcium score 0; no evidence of CAD   Chronic combined systolic and diastolic heart failure ([place]) 11/13/2013   LVEF improved from 25% to 45% after 6 months of antihypertensive therapy    Colchicine overdose 05/29/2019   Conductive hearing loss of left ear with unrestricted hearing of right ear 11/02/2017   Congestive heart failure ([place]) 12/12/2019   Last Assessment & Plan:  Formatting of this note might be different from the original.  No new symptoms. Is continued labetalol 200 mg BID with good effect.   Daytime sleepiness 08/17/2016   Depression    Diverticulitis of large intestine 12/30/2015   GERD (gastroesophageal reflux disease)    Gestational diabetes mellitus (GDM) in second trimester 01/15/2020   Last Assessment & Plan:  Formatting of this note might be different from the original. Patient failed 1hr gtt at 175 and then failed 3 hr gtt. She has been trying medical nutrition therapy over the last few weeks with good effect although she does not have a log with her today. I discussed with Ms. Coggin whether these labs results indicated a diagnosis of gestational diabetes or preexisting diabe   Gout    Hepatic steatosis    Hepatomegaly    Hirsutism    HTN (hypertension)    Hypotension 08/20/2014   Impacted cerumen of left ear 11/02/2017   Infertility, female    Migraine    Morbid obesity ([place]) 03/20/2013   OSA on  CPAP 02/21/2018   Home sleep study showed severe obstructive sleep apnea with an AHI of 62.3 on night 1 and 92.0 and night to with oxygen desaturations less than 70% for 1 minute.  A total of 1 hour and 22 minutes was noted with O2 sats less than 89%.  52% of the time was spent with oxygen saturations less than 90%. Now on CPAP at 14 cm H2O    Polycystic ovary syndrome 03/20/2013   Renal mass    Snoring 08/17/2016   STD (sexually transmitted disease)    HSV (genital)   Systolic heart failure ([place])     Family History  Problem Relation Age of Onset   Hypertension Mother    Hypertension Father    Thyroid disease Father    Hypertension Sister    Cancer Maternal Grandmother        ovarian or colon   Diabetes Paternal Grandfather    Past Surgical History:  Procedure Laterality Date   COLPOSCOPY  2015   D&C  01/23/2020   no surgical hx     Social History   Social History Narrative   Not on file   Immunization History  Administered Date(s) Administered   Influenza-Unspecified 04/09/2017   PFIZER(Purple Top)SARS-COV-2 Vaccination 08/11/2019, 08/31/2019, 04/13/2020   Tdap 05/23/2012     Objective: Vital Signs: There were no vitals taken for this visit.   Physical Exam   Musculoskeletal Exam:   CDAI Exam: CDAI Score: -- Patient Global: --; Provider Global: -- Swollen: --; Tender: -- Joint Exam 11/11/2020   No joint exam has been documented for this visit   There is currently no information documented on the homunculus. Go to the Rheumatology activity and complete the homunculus joint exam.  Investigation: No additional findings.  Imaging: MR ABDOMEN WWO CONTRAST  Result Date: 11/02/2020 CLINICAL DATA:  Further characterization of right interpolar renal lesion EXAM: MRI ABDOMEN WITHOUT AND WITH CONTRAST TECHNIQUE: Multiplanar multisequence MR imaging of the abdomen was performed both before and after the administration of intravenous contrast. CONTRAST:  19mL MULTIHANCE  GADOBENATE DIMEGLUMINE 529 MG/ML IV SOLN COMPARISON:  MRI December 14, 2019 and CT delete that September 22, 2019 FINDINGS: Lower chest: No acute abnormality. Hepatobiliary: Moderate diffuse hepatic steatosis. No suspicious enhancing hepatic lesions. Gallbladder is filled with cholelithiasis measuring up to 1.8 cm without evidence of acute cholecystitis. No biliary ductal dilation. Pancreas: Normal intrinsic T1 signal of the pancreatic parenchyma. No cystic or arterially enhancing pancreatic lesions. No pancreatic ductal dilation. Spleen:  Within normal limits. Adrenals/Urinary Tract:  Bilateral adrenal glands are unremarkable. In the interpolar region of the right kidney there is a 1.8 x 1.4 x 1.5 cm lesion on image 22/7 and 11/3, which was not well visualized on prior noncontrast MRI but measure approximately 1.4 cm in maximum axial dimension when remeasured for consistency. This lesion demonstrates heterogeneous predominantly hyperintense T2 signal intensity with iso to slightly hypo intense intrinsic T1 signal and intense postcontrast enhancement. No loss of signal on out of phase imaging or fat saturated sequences is visualized. No hydronephrosis.  Left kidney is unremarkable. Stomach/Bowel: Visualized portions within the abdomen are unremarkable. Vascular/Lymphatic: No abdominal aortic aneurysm. No evidence of tumor in renal vein. No pathologically enlarged abdominal lymph nodes. Other:  No abdominopelvic ascites. Musculoskeletal: No suspicious bone lesions identified. IMPRESSION: 1. Enhancing 1.8 cm right interpolar renal lesion, suspicious for renal cell carcinoma. Urologic consultation is recommended. 2. No evidence of tumor in renal vein. 3. No evidence of metastatic disease in the abdomen. 4. Moderate diffuse hepatic steatosis. 5. Cholelithiasis without evidence of acute cholecystitis. Electronically Signed   By: Dahlia Bailiff MD   On: 11/02/2020 11:53    Recent Labs: Lab Results  Component Value Date   WBC  5.3 06/13/2020   HGB 13.2 06/13/2020   PLT 287 06/13/2020   NA 139 06/13/2020   K 4.2 06/13/2020   CL 103 06/13/2020   CO2 27 06/13/2020   GLUCOSE 90 06/13/2020   BUN 14 06/13/2020   CREATININE 0.78 06/13/2020   BILITOT 0.8 06/13/2020   ALKPHOS 44 03/21/2020   AST 15 06/13/2020   ALT 21 06/13/2020   PROT 8.0 06/13/2020   ALBUMIN 3.8 03/21/2020   CALCIUM 9.9 06/13/2020   GFRAA 111 06/13/2020    Speciality Comments: No specialty comments available.  Procedures:  No procedures performed Allergies: Fish allergy, Fish oil, Iodine, and Gadolinium derivatives   Assessment / Plan:     Visit Diagnoses: No diagnosis found.  ***  Orders: No orders of the defined types were placed in this encounter.  No orders of the defined types were placed in this encounter.    Follow-Up Instructions: No follow-ups on file.   Collier Salina, MD  Note - This record has been created using [employer].  Chart creation errors have been sought, but may not always  have been located. Such creation errors do not reflect on  the standard of medical care.

## 2020-11-11 ENCOUNTER — Ambulatory Visit: Payer: BC Managed Care – PPO | Admitting: Internal Medicine

## 2020-11-29 ENCOUNTER — Encounter: Payer: Self-pay | Admitting: Internal Medicine

## 2020-11-29 ENCOUNTER — Other Ambulatory Visit: Payer: Self-pay | Admitting: Internal Medicine

## 2020-11-29 ENCOUNTER — Other Ambulatory Visit: Payer: Self-pay

## 2020-11-29 ENCOUNTER — Ambulatory Visit (INDEPENDENT_AMBULATORY_CARE_PROVIDER_SITE_OTHER): Payer: BC Managed Care – PPO | Admitting: Internal Medicine

## 2020-11-29 VITALS — BP 134/88 | HR 80 | Ht 65.0 in | Wt 243.7 lb

## 2020-11-29 DIAGNOSIS — M7532 Calcific tendinitis of left shoulder: Secondary | ICD-10-CM

## 2020-11-29 DIAGNOSIS — M1A0710 Idiopathic chronic gout, right ankle and foot, without tophus (tophi): Secondary | ICD-10-CM | POA: Diagnosis not present

## 2020-11-29 DIAGNOSIS — M7542 Impingement syndrome of left shoulder: Secondary | ICD-10-CM

## 2020-11-29 MED ORDER — TIZANIDINE HCL 4 MG PO TABS: ORAL_TABLET | ORAL | 1 refills | Status: DC

## 2020-11-29 MED ORDER — ALLOPURINOL 100 MG PO TABS: 100.0000 mg | ORAL_TABLET | Freq: Every day | ORAL | 3 refills | Status: DC

## 2020-11-29 MED ORDER — ALLOPURINOL 300 MG PO TABS: 300.0000 mg | ORAL_TABLET | Freq: Every day | ORAL | 3 refills | Status: DC

## 2020-11-29 NOTE — Telephone Encounter (Signed)
Next Visit: 12/03/2021 Last Visit: 11/29/2020  Last Fill: 09/25/2020  Current Dose per office note 11/29/2020: TAKE 1 TABLET(4 MG) BY MOUTH EVERY 6 HOURS AS NEEDED FOR MUSCLE SPASMS  Okay to refill Tizanidine?
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
## 2020-11-29 NOTE — Telephone Encounter (Signed)
Patient called requesting prescription refill of Tizanidine to be sent to [facility] at [address]

## 2020-11-29 NOTE — Addendum Note (Signed)
Addended by: Collier Salina on: 11/29/2020 11:34 AM   Modules accepted: Orders

## 2020-11-30 LAB — BASIC METABOLIC PANEL WITH GFR
BUN: 12 mg/dL (ref 7–25)
CO2: 26 mmol/L (ref 20–32)
Calcium: 9.2 mg/dL (ref 8.6–10.2)
Chloride: 102 mmol/L (ref 98–110)
Creat: 0.74 mg/dL (ref 0.50–1.10)
GFR, Est African American: 118 mL/min/{1.73_m2} (ref >=60)
GFR, Est Non African American: 102 mL/min/{1.73_m2} (ref >=60)
Glucose, Bld: 85 mg/dL (ref 65–99)
Potassium: 3.9 mmol/L (ref 3.5–5.3)
Sodium: 137 mmol/L (ref 135–146)

## 2020-11-30 LAB — URIC ACID: Uric Acid, Serum: 5.2 mg/dL (ref 2.5–7.0)

## 2020-12-03 DIAGNOSIS — R109 Unspecified abdominal pain: Secondary | ICD-10-CM | POA: Diagnosis not present

## 2020-12-03 DIAGNOSIS — K8020 Calculus of gallbladder without cholecystitis without obstruction: Secondary | ICD-10-CM | POA: Diagnosis not present

## 2020-12-03 DIAGNOSIS — N2889 Other specified disorders of kidney and ureter: Secondary | ICD-10-CM | POA: Diagnosis not present

## 2020-12-05 NOTE — Progress Notes (Signed)
Uric acid is at a good level and no problems for kidney function tests so can continue on this dose without changes.

## 2020-12-08 ENCOUNTER — Other Ambulatory Visit: Payer: Self-pay | Admitting: Internal Medicine

## 2020-12-08 DIAGNOSIS — M7542 Impingement syndrome of left shoulder: Secondary | ICD-10-CM

## 2020-12-08 DIAGNOSIS — M1A0710 Idiopathic chronic gout, right ankle and foot, without tophus (tophi): Secondary | ICD-10-CM

## 2020-12-08 DIAGNOSIS — M7532 Calcific tendinitis of left shoulder: Secondary | ICD-10-CM

## 2021-01-10 ENCOUNTER — Other Ambulatory Visit: Payer: Self-pay | Admitting: Internal Medicine

## 2021-01-10 DIAGNOSIS — M1A0710 Idiopathic chronic gout, right ankle and foot, without tophus (tophi): Secondary | ICD-10-CM

## 2021-02-02 ENCOUNTER — Other Ambulatory Visit: Payer: Self-pay | Admitting: Internal Medicine

## 2021-02-02 DIAGNOSIS — M7542 Impingement syndrome of left shoulder: Secondary | ICD-10-CM

## 2021-02-02 DIAGNOSIS — M1A0710 Idiopathic chronic gout, right ankle and foot, without tophus (tophi): Secondary | ICD-10-CM

## 2021-02-02 DIAGNOSIS — M7532 Calcific tendinitis of left shoulder: Secondary | ICD-10-CM

## 2021-03-14 ENCOUNTER — Other Ambulatory Visit: Payer: Self-pay | Admitting: Interventional Cardiology

## 2021-03-14 MED ORDER — AMLODIPINE BESY-BENAZEPRIL HCL 5-40 MG PO CAPS: 1.0000 | ORAL_CAPSULE | Freq: Every day | ORAL | 0 refills | Status: DC

## 2021-03-20 ENCOUNTER — Other Ambulatory Visit: Payer: Self-pay | Admitting: Interventional Cardiology

## 2021-03-31 DIAGNOSIS — R21 Rash and other nonspecific skin eruption: Secondary | ICD-10-CM | POA: Diagnosis not present

## 2021-03-31 DIAGNOSIS — L301 Dyshidrosis [pompholyx]: Secondary | ICD-10-CM | POA: Diagnosis not present

## 2021-03-31 DIAGNOSIS — L303 Infective dermatitis: Secondary | ICD-10-CM | POA: Diagnosis not present

## 2021-03-31 DIAGNOSIS — L299 Pruritus, unspecified: Secondary | ICD-10-CM | POA: Diagnosis not present

## 2021-04-28 ENCOUNTER — Other Ambulatory Visit: Payer: Self-pay | Admitting: Interventional Cardiology

## 2021-05-11 ENCOUNTER — Other Ambulatory Visit: Payer: Self-pay | Admitting: Interventional Cardiology

## 2021-05-12 DIAGNOSIS — L308 Other specified dermatitis: Secondary | ICD-10-CM | POA: Diagnosis not present

## 2021-07-25 ENCOUNTER — Emergency Department (HOSPITAL_COMMUNITY): Payer: Managed Care, Other (non HMO)

## 2021-07-25 ENCOUNTER — Encounter (HOSPITAL_COMMUNITY): Payer: Self-pay

## 2021-07-25 ENCOUNTER — Emergency Department (HOSPITAL_COMMUNITY)
Admission: EM | Admit: 2021-07-25 | Discharge: 2021-07-25 | Disposition: A | Discharge status: Home / Self Care | Payer: Managed Care, Other (non HMO) | Attending: Emergency Medicine | Admitting: Emergency Medicine

## 2021-07-25 DIAGNOSIS — I10 Essential (primary) hypertension: Secondary | ICD-10-CM | POA: Insufficient documentation

## 2021-07-25 DIAGNOSIS — K5732 Diverticulitis of large intestine without perforation or abscess without bleeding: Secondary | ICD-10-CM | POA: Insufficient documentation

## 2021-07-25 DIAGNOSIS — Z79899 Other long term (current) drug therapy: Secondary | ICD-10-CM | POA: Insufficient documentation

## 2021-07-25 DIAGNOSIS — R1032 Left lower quadrant pain: Secondary | ICD-10-CM | POA: Diagnosis present

## 2021-07-25 DIAGNOSIS — N9489 Other specified conditions associated with female genital organs and menstrual cycle: Secondary | ICD-10-CM | POA: Diagnosis not present

## 2021-07-25 DIAGNOSIS — K5792 Diverticulitis of intestine, part unspecified, without perforation or abscess without bleeding: Secondary | ICD-10-CM

## 2021-07-25 LAB — URINALYSIS, ROUTINE W REFLEX MICROSCOPIC
Bilirubin Urine: NEGATIVE
Glucose, UA: NEGATIVE mg/dL
Hgb urine dipstick: NEGATIVE
Ketones, ur: NEGATIVE mg/dL
Nitrite: NEGATIVE
Protein, ur: 30 mg/dL — AB
Specific Gravity, Urine: 1.023 (ref 1.005–1.030)
pH: 6 (ref 5.0–8.0)

## 2021-07-25 LAB — CBC WITH DIFFERENTIAL/PLATELET
Abs Immature Granulocytes: 0.02 10*3/uL (ref 0.00–0.07)
Basophils Absolute: 0 10*3/uL (ref 0.0–0.1)
Basophils Relative: 0 %
Eosinophils Absolute: 0.1 10*3/uL (ref 0.0–0.5)
Eosinophils Relative: 1 %
HCT: 36.2 % (ref 36.0–46.0)
Hemoglobin: 11.7 g/dL — ABNORMAL LOW (ref 12.0–15.0)
Immature Granulocytes: 0 %
Lymphocytes Relative: 29 %
Lymphs Abs: 2.4 10*3/uL (ref 0.7–4.0)
MCH: 29 pg (ref 26.0–34.0)
MCHC: 32.3 g/dL (ref 30.0–36.0)
MCV: 89.6 fL (ref 80.0–100.0)
Monocytes Absolute: 0.6 10*3/uL (ref 0.1–1.0)
Monocytes Relative: 7 %
Neutro Abs: 5.3 10*3/uL (ref 1.7–7.7)
Neutrophils Relative %: 63 %
Platelets: 270 10*3/uL (ref 150–400)
RBC: 4.04 MIL/uL (ref 3.87–5.11)
RDW: 13.6 % (ref 11.5–15.5)
WBC: 8.5 10*3/uL (ref 4.0–10.5)
nRBC: 0 % (ref 0.0–0.2)

## 2021-07-25 LAB — COMPREHENSIVE METABOLIC PANEL
ALT: 21 U/L (ref 0–44)
AST: 21 U/L (ref 15–41)
Albumin: 3.8 g/dL (ref 3.5–5.0)
Alkaline Phosphatase: 49 U/L (ref 38–126)
Anion gap: 10 (ref 5–15)
BUN: 11 mg/dL (ref 6–20)
CO2: 25 mmol/L (ref 22–32)
Calcium: 9 mg/dL (ref 8.9–10.3)
Chloride: 102 mmol/L (ref 98–111)
Creatinine, Ser: 0.78 mg/dL (ref 0.44–1.00)
GFR, Estimated: >60 mL/min (ref >60)
Glucose, Bld: 117 mg/dL — ABNORMAL HIGH (ref 70–99)
Potassium: 3.4 mmol/L — ABNORMAL LOW (ref 3.5–5.1)
Sodium: 137 mmol/L (ref 135–145)
Total Bilirubin: 1.4 mg/dL — ABNORMAL HIGH (ref 0.3–1.2)
Total Protein: 7.4 g/dL (ref 6.5–8.1)

## 2021-07-25 LAB — I-STAT BETA HCG BLOOD, ED (MC, WL, AP ONLY): I-stat hCG, quantitative: <5 m[IU]/mL (ref <5)

## 2021-07-25 LAB — LIPASE, BLOOD: Lipase: 25 U/L (ref 11–51)

## 2021-07-25 MED ORDER — HYDROMORPHONE HCL 1 MG/ML IJ SOLN
1.0000 mg | Freq: Once | INTRAMUSCULAR | Status: DC
  Filled 2021-07-25: qty 1

## 2021-07-25 MED ORDER — ONDANSETRON 4 MG PO TBDP: ORAL_TABLET | ORAL | 0 refills | Status: AC

## 2021-07-25 MED ORDER — ONDANSETRON HCL 4 MG/2ML IJ SOLN
4.0000 mg | Freq: Once | INTRAMUSCULAR | Status: AC
  Administered 2021-07-25: 4 mg via INTRAVENOUS
  Filled 2021-07-25: qty 2

## 2021-07-25 MED ORDER — METRONIDAZOLE 500 MG PO TABS: 500.0000 mg | ORAL_TABLET | Freq: Four times a day (QID) | ORAL | 0 refills | Status: AC

## 2021-07-25 MED ORDER — SODIUM CHLORIDE 0.9 % IV BOLUS
1000.0000 mL | Freq: Once | INTRAVENOUS | Status: AC
  Administered 2021-07-25: 1000 mL via INTRAVENOUS

## 2021-07-25 MED ORDER — METRONIDAZOLE 500 MG/100ML IV SOLN
500.0000 mg | Freq: Two times a day (BID) | INTRAVENOUS | Status: DC
  Filled 2021-07-25: qty 100

## 2021-07-25 MED ORDER — CIPROFLOXACIN IN D5W 400 MG/200ML IV SOLN
400.0000 mg | Freq: Once | INTRAVENOUS | Status: DC
  Filled 2021-07-25: qty 200

## 2021-07-25 MED ORDER — CIPROFLOXACIN HCL 500 MG PO TABS: ORAL_TABLET | ORAL | 0 refills | Status: AC

## 2021-07-25 MED ORDER — HYDROMORPHONE HCL 1 MG/ML IJ SOLN
1.0000 mg | Freq: Once | INTRAMUSCULAR | Status: AC
  Administered 2021-07-25: 1 mg via INTRAVENOUS
  Filled 2021-07-25: qty 1

## 2021-07-25 MED ORDER — OXYCODONE-ACETAMINOPHEN 5-325 MG PO TABS: 1.0000 | ORAL_TABLET | Freq: Four times a day (QID) | ORAL | 0 refills | Status: AC | PRN

## 2021-07-25 NOTE — Discharge Instructions (Signed)
Follow-up with your doctor next week.  Return if pain getting worse or vomiting and unable to keep the medicine down.  Drink plenty of fluids

## 2021-07-25 NOTE — ED Triage Notes (Signed)
Pt arrived via POV, c/o LLQ abd pain with nausea and diarrhea. Denies any vomiting. Also endorses chills.

## 2021-07-25 NOTE — ED Provider Notes (Signed)
[facility] DEPT Provider Note   CSN: [id number] Arrival date & time: 07/25/21  6606     History  Chief Complaint  Patient presents with   Abdominal Pain   Diarrhea    Renee Roberson is a 41 y.o. female.  Patient complains of left lower quadrant abdominal pain.  Patient has a history of hypertension  The history is provided by the patient and medical records. No language interpreter was used.  Abdominal Pain Pain location:  LLQ Pain quality: aching   Pain radiates to:  Does not radiate Pain severity:  Moderate Onset quality:  Sudden Timing:  Constant Progression:  Worsening Chronicity:  New Context: not alcohol use   Relieved by:  Nothing Associated symptoms: diarrhea   Associated symptoms: no chest pain, no cough, no fatigue and no hematuria   Diarrhea Associated symptoms: abdominal pain   Associated symptoms: no headaches       Home Medications Prior to Admission medications   Medication Sig Start Date End Date Taking? Authorizing Provider  ciprofloxacin (CIPRO) 500 MG tablet One po bid 07/25/21  Yes Milton Ferguson, MD  metroNIDAZOLE (FLAGYL) 500 MG tablet Take 1 tablet (500 mg total) by mouth 4 (four) times daily. 07/25/21  Yes Milton Ferguson, MD  ondansetron (ZOFRAN-ODT) 4 MG disintegrating tablet 4mg  ODT q4 hours prn nausea/vomit 07/25/21  Yes Milton Ferguson, MD  oxyCODONE-acetaminophen (PERCOCET) 5-325 MG tablet Take 1 tablet by mouth every 6 (six) hours as needed. 07/25/21  Yes Milton Ferguson, MD  allopurinol (ZYLOPRIM) 100 MG tablet Take 1 tablet (100 mg total) by mouth daily. In addition to 1 300 mg tablet for 400 mg total daily dose 11/29/20   Rice, Resa Miner, MD  allopurinol (ZYLOPRIM) 300 MG tablet Take 1 tablet (300 mg total) by mouth daily. In addition to 1 tablet of 100 mg allopurinol for a total of 400 mg daily. 11/29/20 02/27/21  Rice, Resa Miner, MD  amLODipine-benazepril (LOTREL) 5-40 MG capsule Take 1 capsule by mouth  daily. Please make overdue appt with Dr. Tamala Julian before anymore refills. Thank you 1st attempt 03/14/21   Belva Crome, MD  aspirin-acetaminophen-caffeine [facility] MIGRAINE) [phone] MG tablet Take 2 tablets by mouth as needed for headache.     [provider]  carvedilol (COREG) 25 MG tablet TAKE 1 TABLET(25 MG) BY MOUTH TWICE DAILY WITH A MEAL 04/29/21   Belva Crome, MD  furosemide (LASIX) 40 MG tablet Take 1 tablet (40 mg total) by mouth daily. Please make overdue appt with Dr. Tamala Julian before anymore refills. Thank you 1st attempt 03/14/21   Belva Crome, MD  mupirocin ointment (BACTROBAN) 2 % Apply topically 2 (two) times daily as needed. 10/31/20   [provider]  omeprazole (PRILOSEC) 40 MG capsule as needed. Patient not taking: Reported on 11/29/2020    [provider]  tiZANidine (ZANAFLEX) 4 MG tablet TAKE 1 TABLET(4 MG) BY MOUTH EVERY 6 HOURS AS NEEDED FOR MUSCLE SPASMS 02/03/21   Rice, Resa Miner, MD      Allergies    Fish allergy, Fish oil, Iodine, and Gadolinium derivatives    Review of Systems   Review of Systems  Constitutional:  Negative for appetite change and fatigue.  HENT:  Negative for congestion, ear discharge and sinus pressure.   Eyes:  Negative for discharge.  Respiratory:  Negative for cough.   Cardiovascular:  Negative for chest pain.  Gastrointestinal:  Positive for abdominal pain and diarrhea.  Genitourinary:  Negative for  frequency and hematuria.  Musculoskeletal:  Negative for back pain.  Skin:  Negative for rash.  Neurological:  Negative for seizures and headaches.  Psychiatric/Behavioral:  Negative for hallucinations.    Physical Exam Updated Vital Signs BP 124/85    Pulse 83    Temp 98 F (36.7 C) (Oral)    Resp 18    Ht 5\' 5"  (1.651 m)    Wt 109.3 kg    SpO2 98%    BMI 40.10 kg/m  Physical Exam Vitals and nursing note reviewed.  Constitutional:      Appearance: She is well-developed.  HENT:     Head: Normocephalic.      Nose: Nose normal.  Eyes:     General: No scleral icterus.    Conjunctiva/sclera: Conjunctivae normal.  Neck:     Thyroid: No thyromegaly.  Cardiovascular:     Rate and Rhythm: Normal rate and regular rhythm.     Heart sounds: No murmur heard.   No friction rub. No gallop.  Pulmonary:     Breath sounds: No stridor. No wheezing or rales.  Chest:     Chest wall: No tenderness.  Abdominal:     General: There is no distension.     Tenderness: There is abdominal tenderness. There is no rebound.  Musculoskeletal:        General: Normal range of motion.     Cervical back: Neck supple.  Lymphadenopathy:     Cervical: No cervical adenopathy.  Skin:    Findings: No erythema or rash.  Neurological:     Mental Status: She is alert and oriented to person, place, and time.     Motor: No abnormal muscle tone.     Coordination: Coordination normal.  Psychiatric:        Behavior: Behavior normal.    ED Results / Procedures / Treatments   Labs (all labs ordered are listed, but only abnormal results are displayed) Labs Reviewed  CBC WITH DIFFERENTIAL/PLATELET - Abnormal; Notable for the following components:      Result Value   Hemoglobin 11.7 (*)    All other components within normal limits  COMPREHENSIVE METABOLIC PANEL - Abnormal; Notable for the following components:   Potassium 3.4 (*)    Glucose, Bld 117 (*)    Total Bilirubin 1.4 (*)    All other components within normal limits  URINALYSIS, ROUTINE W REFLEX MICROSCOPIC - Abnormal; Notable for the following components:   APPearance CLOUDY (*)    Protein, ur 30 (*)    Leukocytes,Ua SMALL (*)    Bacteria, UA MANY (*)    All other components within normal limits  URINE CULTURE  LIPASE, BLOOD  I-STAT BETA HCG BLOOD, ED ([facility], [facility], [facility] ONLY)    EKG None  Radiology CT ABDOMEN PELVIS WO CONTRAST  Result Date: 07/25/2021 CLINICAL DATA:  Abdominal pain acute nonlocalized. EXAM: CT ABDOMEN AND PELVIS WITHOUT CONTRAST  TECHNIQUE: Multidetector CT imaging of the abdomen and pelvis was performed following the standard protocol without IV contrast. RADIATION DOSE REDUCTION: This exam was performed according to the departmental dose-optimization program which includes automated exposure control, adjustment of the mA and/or kV according to patient size and/or use of iterative reconstruction technique. COMPARISON:  MRI November 02, 2020 and CT September 22, 2019 FINDINGS: Lower chest: No acute abnormality. Hepatobiliary: Diffuse hepatic steatosis. Gallbladder is unremarkable. No biliary ductal dilation. Pancreas: No pancreatic ductal dilation or evidence of acute inflammation. Spleen: Normal size spleen without focal splenic lesion. Adrenals/Urinary Tract:  Bilateral adrenal glands appear normal. No hydronephrosis. No renal, ureteral or bladder calculi identified. Previously described heterogeneous right upper pole renal lesion is not well seen on today's examination. Urinary bladder is unremarkable for degree of distension. Stomach/Bowel: No enteric contrast was administered. Stomach is unremarkable for degree of distension. No pathologic dilation of small or large bowel. Terminal ileum and appendix appear normal. Colonic diverticulosis with acute sigmoid colonic diverticulitis. No pneumatosis. Vascular/Lymphatic: Scattered aortic atherosclerosis with out abdominal aortic aneurysm. No pathologically enlarged abdominal or pelvic lymph nodes. Reproductive: Uterus and bilateral adnexa are unremarkable. Other: No walled off fluid collections.  No pneumoperitoneum. Musculoskeletal: No acute osseous abnormality. Asymmetric sclerosis of the right SI joint is unchanged dating back to September 22, 2019. IMPRESSION: 1. Acute uncomplicated sigmoid colonic diverticulitis. 2. Diffuse hepatic steatosis. 3.  Aortic Atherosclerosis (ICD10-I70.0). Electronically Signed   By: Dahlia Bailiff M.D.   On: 07/25/2021 11:51    Procedures Procedures    Medications  Ordered in ED Medications  HYDROmorphone (DILAUDID) injection 1 mg (has no administration in time range)  ciprofloxacin (CIPRO) IVPB 400 mg (has no administration in time range)  metroNIDAZOLE (FLAGYL) IVPB 500 mg (has no administration in time range)  sodium chloride 0.9 % bolus 1,000 mL (1,000 mLs Intravenous New Bag/Given 07/25/21 0932)  HYDROmorphone (DILAUDID) injection 1 mg (1 mg Intravenous Given 07/25/21 0932)  ondansetron (ZOFRAN) injection 4 mg (4 mg Intravenous Given 07/25/21 0930)  ondansetron (ZOFRAN) injection 4 mg (4 mg Intravenous Given 07/25/21 1240)    ED Course/ Medical Decision Making/ A&P                           Medical Decision Making Amount and/or Complexity of Data Reviewed Labs: ordered. Radiology: ordered.  Risk Prescription drug management.   Patient with diverticulitis.  She will be discharged home on Cipro and Flagyl and will follow up with her PCP.  Patient may also have a urinary tract infection.  Culture started.  Patient on Cipro   This patient presents to the ED for concern of abdominal pain, this involves an extensive number of treatment options, and is a complaint that carries with it a high risk of complications and morbidity.  The differential diagnosis includes diverticulitis, ectopic pregnancy, appendicitis   Co morbidities that complicate the patient evaluation  Hypertension   Additional history obtained:  Additional history obtained from patient External records from outside source obtained and reviewed including hospital records   Lab Tests:  I Ordered, and personally interpreted labs.  The pertinent results include: CBC that showed mild decrease hemoglobin 11.7.  Urinalysis suggest UTI with bacteria and white cell   Imaging Studies ordered:  I ordered imaging studies including CT abdomen I independently visualized and interpreted imaging which showed diverticulitis I agree with the radiologist interpretation   Cardiac  Monitoring:  The patient was maintained on a cardiac monitor.  I personally viewed and interpreted the cardiac monitored which showed an underlying rhythm of: Normal sinus rhythm   Medicines ordered and prescription drug management:  I ordered medication including Dilaudid for pain Reevaluation of the patient after these medicines showed that the patient improved I have reviewed the patients home medicines and have made adjustments as needed   Test Considered:  None   Critical Interventions:  None   Consultations Obtained:  No consult Problem List / ED Course:  Diverticulitis, UTI,   Reevaluation:  After the interventions noted above, I reevaluated the patient and  found that they have :stayed the same   Social Determinants of Health:  None   Dispostion:  After consideration of the diagnostic results and the patients response to treatment, I feel that the patent would benefit from discharged home on Cipro and Flagyl with close follow-up with PCP.          Final Clinical Impression(s) / ED Diagnoses Final diagnoses:  Diverticulitis    Rx / DC Orders ED Discharge Orders          Ordered    ondansetron (ZOFRAN-ODT) 4 MG disintegrating tablet        07/25/21 1251    oxyCODONE-acetaminophen (PERCOCET) 5-325 MG tablet  Every 6 hours PRN        07/25/21 1251    ciprofloxacin (CIPRO) 500 MG tablet        07/25/21 1251    metroNIDAZOLE (FLAGYL) 500 MG tablet  4 times daily        07/25/21 1251              Milton Ferguson, MD 07/25/21 1646

## 2021-07-26 LAB — URINE CULTURE

## 2021-09-17 ENCOUNTER — Other Ambulatory Visit: Payer: Self-pay | Admitting: Urology

## 2021-09-17 DIAGNOSIS — D49511 Neoplasm of unspecified behavior of right kidney: Secondary | ICD-10-CM

## 2021-09-17 IMAGING — MR MR ABDOMEN W/O CM
9 series · 48 of 48 positions shown · non-contrast
Comparison: Abdominal CT 09/22/2019

CLINICAL DATA: Pregnant patient with right renal mass on CT. Right
upper quadrant pain for 3 months.

EXAM:
MRI ABDOMEN WITHOUT CONTRAST
TECHNIQUE: Multiplanar multisequence MR imaging was performed without the
administration of intravenous contrast.

[Series 3: T2 · coronal · 6.0mm · 1.72mm/px · 4 of 30 slices shown]
[im 1/30]
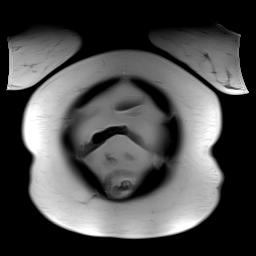
[im 10/30]
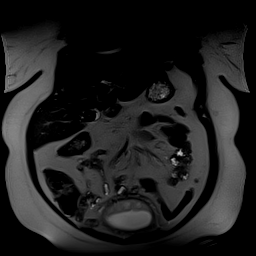
[im 20/30]
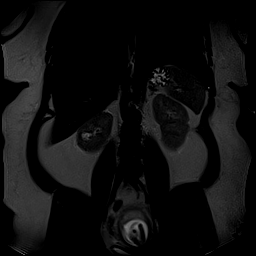
[im 30/30]
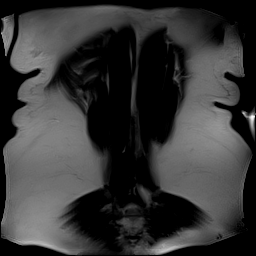

[Series 5: T2 fat-sat · axial · 6.0mm · 1.25mm/px · z∈[-61,+191]mm · 4 of 36 slices shown (1 of 2)]
[im 1/36]
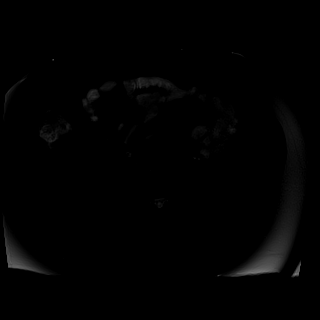
[im 12/36]
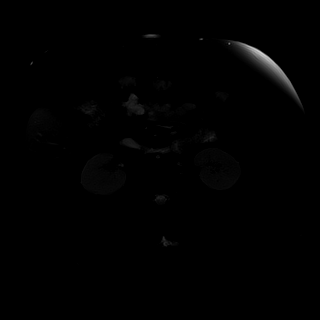
[im 24/36]
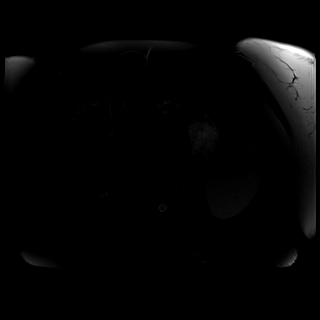
[im 36/36]
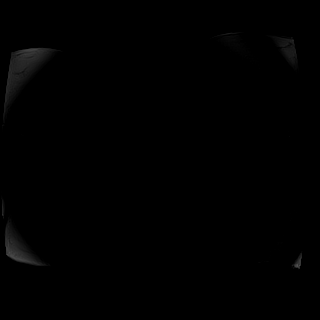

[Series 6: T1 · axial · 3.0mm · 1.25mm/px · z∈[-67,+146]mm · 7 of 72 slices shown (1 of 3)]
[im 1/72]
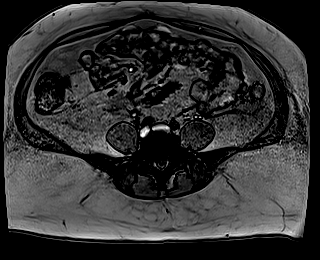
[im 12/72]
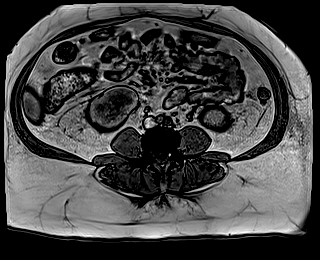
[im 24/72]
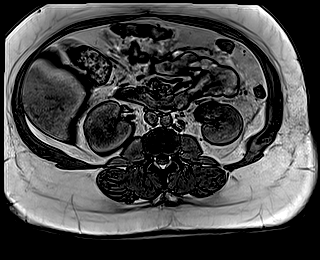
[im 36/72]
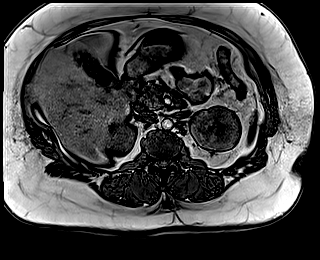
[im 48/72]
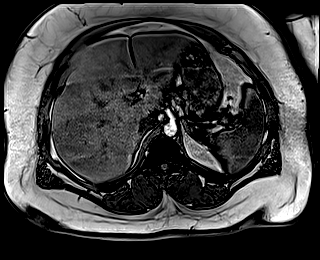
[im 60/72]
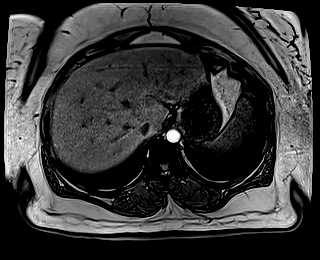
[im 72/72]
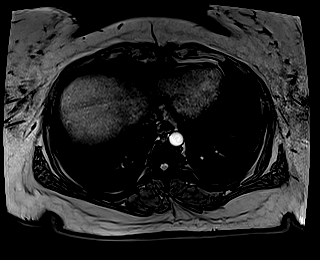

[Series 7: T1 · axial · 3.0mm · 1.25mm/px · z∈[-67,+146]mm · 7 of 72 slices shown (2 of 3)]
[im 1/72]
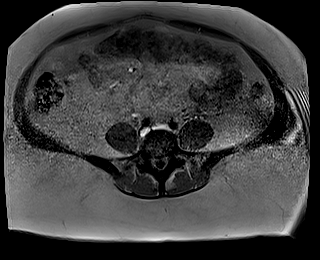
[im 12/72]
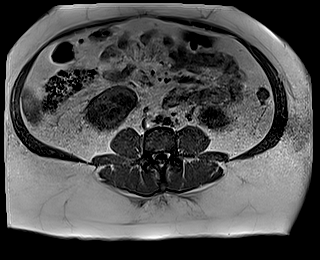
[im 24/72]
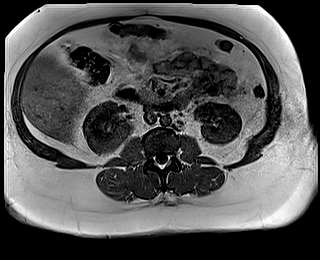
[im 36/72]
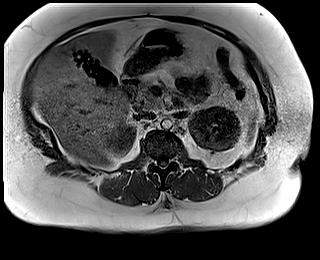
[im 48/72]
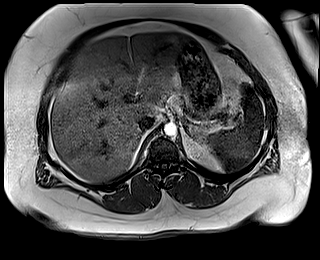
[im 60/72]
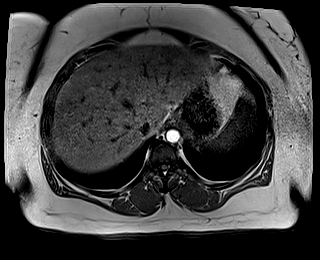
[im 72/72]
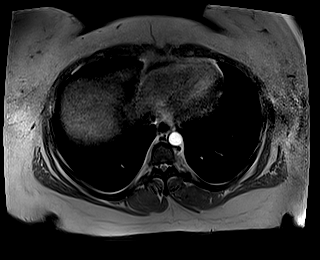

[Series 8: DWI · axial · 6.0mm · 1.49mm/px · z∈[-119,+133]mm · 7 of 72 slices shown (1 of 2)]
[im 1/72]
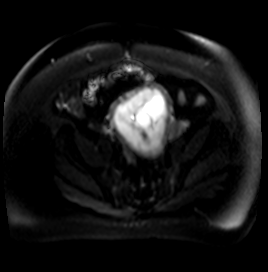
[im 12/72]
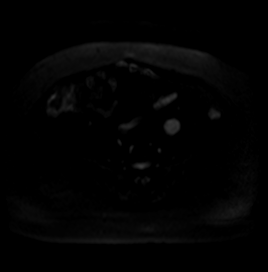
[im 24/72]
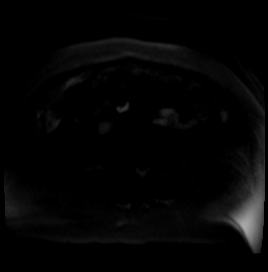
[im 36/72]
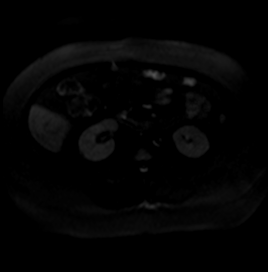
[im 48/72]
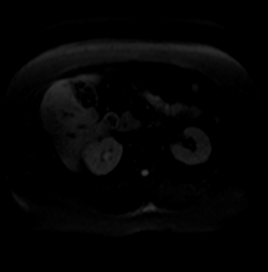
[im 60/72]
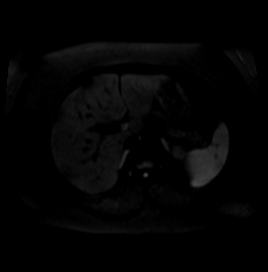
[im 72/72]
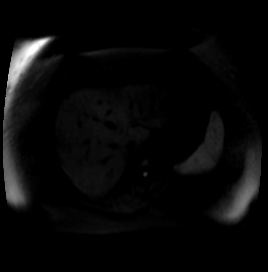

[Series 9: DWI · axial · 6.0mm · 1.49mm/px · z∈[-119,+133]mm · 4 of 36 slices shown (2 of 2)]
[im 1/36]
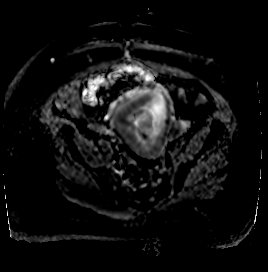
[im 12/36]
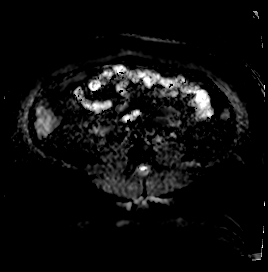
[im 24/36]
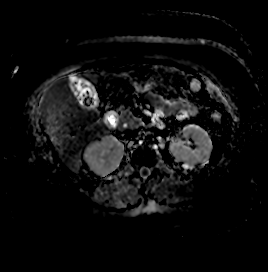
[im 36/36]
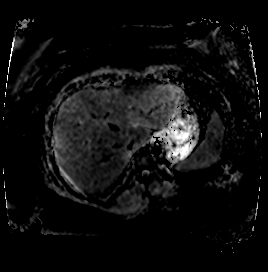

[Series 10: bSSFP · axial · 5.0mm · 0.84mm/px · z∈[-87,+127]mm · 4 of 40 slices shown]
[im 1/40]
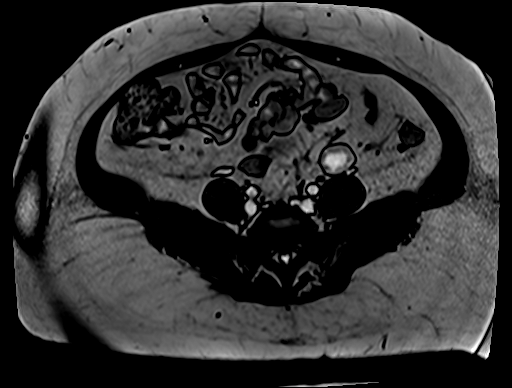
[im 14/40]
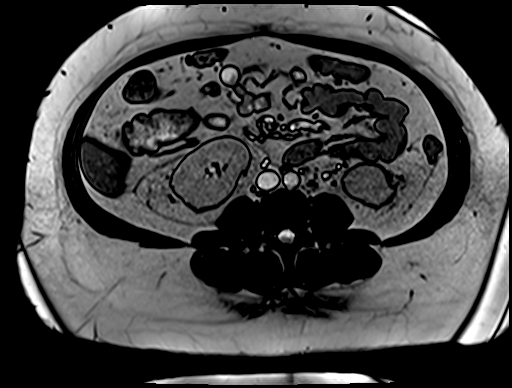
[im 27/40]
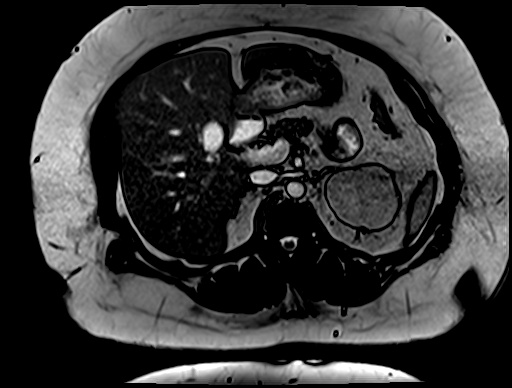
[im 40/40]
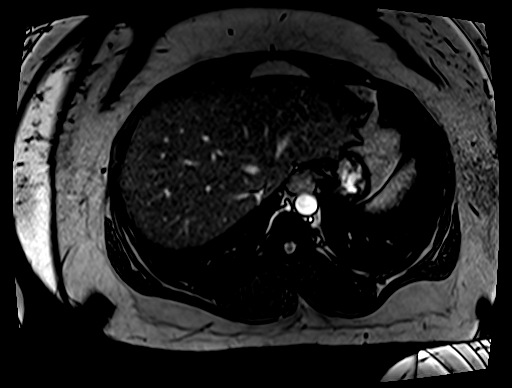

[Series 12: T1 · axial · 3.0mm · 1.38mm/px · z∈[-96,+141]mm · 8 of 80 slices shown (3 of 3)]
[im 1/80]
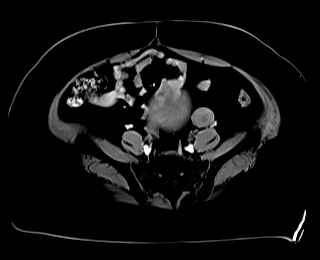
[im 12/80]
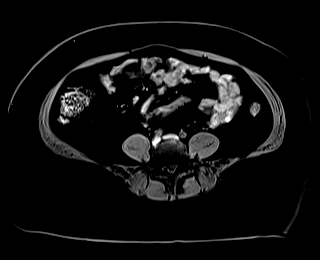
[im 23/80]
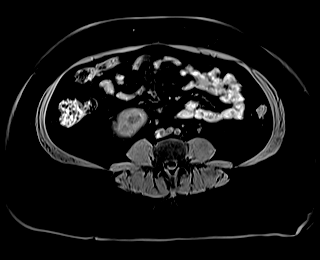
[im 34/80]
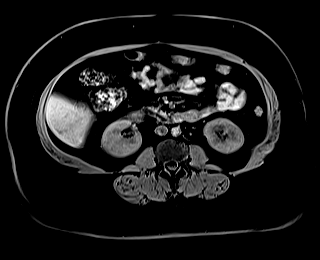
[im 46/80]
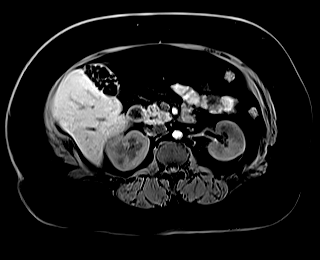
[im 57/80]
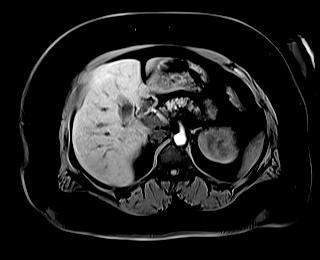
[im 68/80]
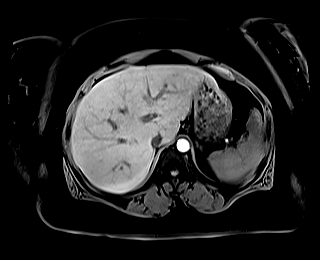
[im 80/80]
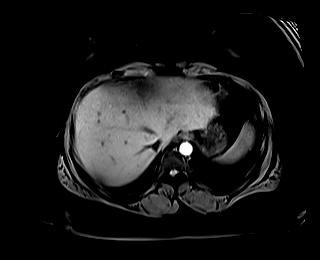

[Series 14: T2 fat-sat · coronal · 6.0mm · 1.38mm/px · 3 of 31 slices shown (2 of 2)]
[im 1/31]
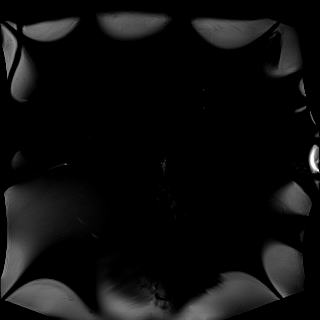
[im 16/31]
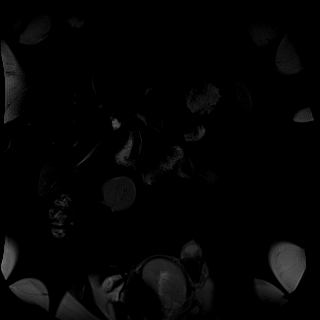
[im 31/31]
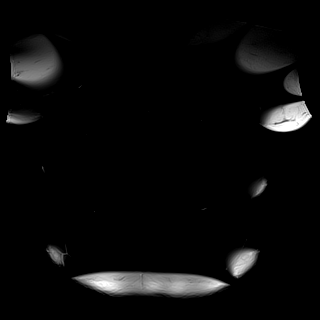

[48 of 48 positions shown; findings below may reference images not displayed]

FINDINGS: Lower chest: Mild cardiomegaly, without pericardial or pleural
effusion. Prominent bilateral axillary nodes are suboptimally
evaluated on [DATE] and favored to be reactive.

Hepatobiliary: Hepatomegaly at 21.0 cm craniocaudal. No suspicious
liver lesion.

Stone filled gallbladder with stones up to 2.0 cm. No acute
cholecystitis or biliary duct dilatation.

Pancreas:  Normal, without mass or ductal dilatation.

Spleen:  Normal in size, without focal abnormality.

Adrenals/Urinary Tract:  Normal adrenal glands.  Normal left kidney.

The interpolar right renal lesion is less apparent on MRI. An area
of vague T2 hyperintensity within the interpolar right kidney
centrally measures on the order of 1.0 cm on [DATE] and 1.1 cm on
[DATE]. This area is relatively T1 isointense to the remainder of the
kidney. Compare on the order of 2.8 by 2.2 cm on the prior
postcontrast CT.

No hydronephrosis.

Stomach/Bowel: Normal stomach and abdominal bowel loops.

Vascular/Lymphatic: Normal aortic caliber. No abdominal adenopathy.

Other:  No ascites.

Musculoskeletal: No acute osseous abnormality.
IMPRESSION: 1. Limited exam secondary to noncontrast technique.
2. Interpolar right renal lesion detailed on the prior CT is less
apparent on today's noncontrast MRI. This may represent partial
resolution of an area of pyelonephritis. However, a relatively
occult (on noncontrast MRI) lesion cannot be entirely excluded.
Recommend postpartum pre and post contrast abdominal MRI.
3. Cholelithiasis.
4. Hepatomegaly.

## 2021-10-21 ENCOUNTER — Other Ambulatory Visit: Payer: Self-pay | Admitting: Urology

## 2021-10-21 DIAGNOSIS — D49511 Neoplasm of unspecified behavior of right kidney: Secondary | ICD-10-CM

## 2021-11-08 ENCOUNTER — Other Ambulatory Visit: Payer: Managed Care, Other (non HMO)

## 2021-11-16 ENCOUNTER — Ambulatory Visit
Admission: RE | Admit: 2021-11-16 | Discharge: 2021-11-16 | Disposition: A | Discharge status: Home / Self Care | Payer: Managed Care, Other (non HMO) | Source: Ambulatory Visit | Attending: Urology | Admitting: Urology

## 2021-11-16 DIAGNOSIS — D49511 Neoplasm of unspecified behavior of right kidney: Secondary | ICD-10-CM

## 2021-12-03 ENCOUNTER — Ambulatory Visit: Payer: BC Managed Care – PPO | Admitting: Internal Medicine

## 2021-12-08 ENCOUNTER — Other Ambulatory Visit: Payer: Self-pay | Admitting: *Deleted

## 2021-12-08 MED ORDER — AMLODIPINE BESY-BENAZEPRIL HCL 5-40 MG PO CAPS: 1.0000 | ORAL_CAPSULE | Freq: Every day | ORAL | 0 refills | Status: DC

## 2021-12-10 ENCOUNTER — Other Ambulatory Visit: Payer: Self-pay | Admitting: Interventional Cardiology

## 2021-12-13 ENCOUNTER — Ambulatory Visit
Admission: RE | Admit: 2021-12-13 | Discharge: 2021-12-13 | Disposition: A | Discharge status: Home / Self Care | Payer: Managed Care, Other (non HMO) | Source: Ambulatory Visit | Attending: Urology | Admitting: Urology

## 2021-12-13 DIAGNOSIS — D49511 Neoplasm of unspecified behavior of right kidney: Secondary | ICD-10-CM

## 2021-12-13 MED ORDER — GADOBENATE DIMEGLUMINE 529 MG/ML IV SOLN
20.0000 mL | Freq: Once | INTRAVENOUS | Status: AC | PRN
  Administered 2021-12-13: 20 mL via INTRAVENOUS

## 2021-12-18 ENCOUNTER — Other Ambulatory Visit: Payer: Self-pay | Admitting: Internal Medicine

## 2021-12-18 DIAGNOSIS — M1A0710 Idiopathic chronic gout, right ankle and foot, without tophus (tophi): Secondary | ICD-10-CM

## 2021-12-18 NOTE — Telephone Encounter (Signed)
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

## 2021-12-19 ENCOUNTER — Other Ambulatory Visit: Payer: Self-pay | Admitting: *Deleted

## 2021-12-19 DIAGNOSIS — M1A0710 Idiopathic chronic gout, right ankle and foot, without tophus (tophi): Secondary | ICD-10-CM

## 2021-12-19 MED ORDER — ALLOPURINOL 300 MG PO TABS: 300.0000 mg | ORAL_TABLET | Freq: Every day | ORAL | 0 refills | Status: DC

## 2021-12-19 MED ORDER — ALLOPURINOL 100 MG PO TABS: ORAL_TABLET | ORAL | 0 refills | Status: DC

## 2021-12-26 ENCOUNTER — Telehealth: Payer: Self-pay | Admitting: Interventional Cardiology

## 2021-12-26 ENCOUNTER — Other Ambulatory Visit: Payer: Self-pay

## 2021-12-26 MED ORDER — FUROSEMIDE 40 MG PO TABS: 40.0000 mg | ORAL_TABLET | Freq: Every day | ORAL | 0 refills | Status: DC

## 2021-12-26 MED ORDER — AMLODIPINE BESY-BENAZEPRIL HCL 5-40 MG PO CAPS: 1.0000 | ORAL_CAPSULE | Freq: Every day | ORAL | 0 refills | Status: DC

## 2021-12-26 NOTE — Telephone Encounter (Signed)
*  STAT* If patient is at the pharmacy, call can be transferred to refill team.   1. Which medications need to be refilled? (please list name of each medication and dose if known)   amLODipine-benazepril (LOTREL) 5-40 MG capsule    furosemide (LASIX) 40 MG tablet   2. Which pharmacy/location (including street and city if local pharmacy) is medication to be sent to? [place], [place] - [address] AT [place]  3. Do they need a 30 day or 90 day supply?  30 day   Pt has scheduled appt on 01/06/22. Please advise

## 2021-12-31 NOTE — Progress Notes (Signed)
Office Visit    Patient Name: Renee Roberson Date of Encounter: 01/06/2022  Primary Care Provider:  Donald Prose, MD Primary Cardiologist:  [name], MD Primary Electrophysiologist: None  Chief Complaint    Renee Roberson is a 41 y.o. female with PMH of obstructive sleep apnea on CPAP, PCOS, morbid obesity, hypertension, anxiety, chronic combined systolic and diastolic CHF-echo from 01/4764 showed improvement in ejection fraction from 25% to 60 to 65% with grade 1 diastolic dysfunction who presents today for follow-up of congestive heart failure.  Past Medical History    Past Medical History:  Diagnosis Date   Abnormal Pap smear of cervix    Accelerated essential hypertension 03/20/2013   Left ventricular systolic dysfunction as a complication    Advanced maternal age, 1st pregnancy 12/12/2019   Formatting of this note might be different from the original. Dating: 06/03/2020 by U/S                                             First trimester:  '[ ]'$  Prenatal labs reviewed  '[ ]'$  SMA/Hgb electrophoresis/CF   '[ ]'$  Early 1 hr GTT*  '[ ]'$  Baseline HELLP* '[ ]'$  LDASA* '[ ]'$  TB screening* '[ ]'$  Medicaid patients: Pregnancy Medical Home Risk Screen (Code SO280)* Third trimester:  '[ ]'$  CBC/RPR/HIV '[ ]'$  1hr GTT '[ ]'$    Amenorrhea    Cancer of kidney ([place])    Chest pain    Coronary CTA 04/2019: Calcium score 0; no evidence of CAD   Chronic combined systolic and diastolic heart failure ([place]) 11/13/2013   LVEF improved from 25% to 45% after 6 months of antihypertensive therapy    Colchicine overdose 05/29/2019   Conductive hearing loss of left ear with unrestricted hearing of right ear 11/02/2017   Congestive heart failure ([place]) 12/12/2019   Last Assessment & Plan:  Formatting of this note might be different from the original. No new symptoms. Is continued labetalol 200 mg BID with good effect.   Daytime sleepiness 08/17/2016   Depression    Diverticulitis of large intestine 12/30/2015   GERD  (gastroesophageal reflux disease)    Gestational diabetes mellitus (GDM) in second trimester 01/15/2020   Last Assessment & Plan:  Formatting of this note might be different from the original. Patient failed 1hr gtt at 175 and then failed 3 hr gtt. She has been trying medical nutrition therapy over the last few weeks with good effect although she does not have a log with her today. I discussed with Ms. Chiem whether these labs results indicated a diagnosis of gestational diabetes or preexisting diabe   Gout    Hepatic steatosis    Hepatomegaly    Hirsutism    HTN (hypertension)    Hypotension 08/20/2014   Impacted cerumen of left ear 11/02/2017   Infertility, female    Migraine    Morbid obesity ([place]) 03/20/2013   OSA on CPAP 02/21/2018   Home sleep study showed severe obstructive sleep apnea with an AHI of 62.3 on night 1 and 92.0 and night to with oxygen desaturations less than 70% for 1 minute.  A total of 1 hour and 22 minutes was noted with O2 sats less than 89%.  52% of the time was spent with oxygen saturations less than 90%. Now on CPAP at 14 cm H2O    Polycystic  ovary syndrome 03/20/2013   Renal mass    Snoring 08/17/2016   STD (sexually transmitted disease)    HSV (genital)   Systolic heart failure ([place])    Past Surgical History:  Procedure Laterality Date   COLPOSCOPY  2015   D&C  01/23/2020   no surgical hx      Allergies  Allergies  Allergen Reactions   Fish Allergy Anaphylaxis, Hives and Swelling   Fish Oil     Other reaction(s): Unknown   Iodine     Other reaction(s): Unknown   Gadolinium Derivatives Other (See Comments)    Pt sneezed immediately after contrast injection/ throat felt itchy/ pt was observed by Dr Katherine Basset and then released. No medication was administered. Pt is to have a 13 hr prep if given gadolinium again in the future//jv    History of Present Illness    Renee Roberson is a 42 year old female with the above-mentioned past medical  history who presents today for follow-up of hypertension and congestive heart failure.  She has been followed by Dr. Tamala Julian since around 2014 and completed TTE that revealedEF of 20-25%, severe global ventricular dysfunction with grade 3 diastolic dysfunction and elevated left atrial pressures.  She has improved diastolic dysfunction with better BP control with recent EF of 60-65% with no RWMA and moderate LVH.  She was last seen by Dr. Tamala Julian in 12/2019 for follow-up.  During visit patient had elevated blood pressures around 180/100 at home.  She was started on her Lotrel and resumed carvedilol.  She was encouraged to follow-up in 2 weeks for blood pressure check in the clinic.  Patient had blood pressure reported of 126/79.  Since last being seen in the office patient reports that she has been doing well since her last visit few years ago.  She is euvolemic on examination today and blood pressures are well controlled at 132/84.  She is compliant with her medications and denies any side effects or adverse reactions.  She does report some increased palpitations with stress and also physical activity.  She is a bowler and has noticed palpitations when throwing the bowling ball during games.  She also walks with her mother for 30 minutes a day.  She reports great compliance with her CPAP currently.  She denies chest pain, dyspnea, PND, orthopnea, nausea, vomiting, dizziness, syncope, edema, weight gain, or early satiety.   Home Medications    Current Outpatient Medications  Medication Sig Dispense Refill   allopurinol (ZYLOPRIM) 100 MG tablet TAKE 1 TABLET(100 MG) BY MOUTH DAILY IN ADDITION TO 1 300 MG TABLET FOR 400 MG. TOTAL DAILY DOSE 90 tablet 0   allopurinol (ZYLOPRIM) 300 MG tablet Take 1 tablet (300 mg total) by mouth daily. In addition to 1 tablet of 100 mg allopurinol for a total of 400 mg daily. 90 tablet 0   amLODipine-benazepril (LOTREL) 5-40 MG capsule Take 1 capsule by mouth daily. Please keep  upcoming appt. With Cardiologist in  Aug. In order to receive future refills. Thank You. 90 capsule 0   aspirin-acetaminophen-caffeine (EXCEDRIN MIGRAINE) 250-250-65 MG tablet Take 2 tablets by mouth as needed for headache.      carvedilol (COREG) 25 MG tablet TAKE 1 TABLET(25 MG) BY MOUTH TWICE DAILY WITH A MEAL 30 tablet 0   ciprofloxacin (CIPRO) 500 MG tablet One po bid 28 tablet 0   furosemide (LASIX) 20 MG tablet Take 1 tablet (20 mg total) by mouth daily. 90 tablet 3   metroNIDAZOLE (FLAGYL) 500  MG tablet Take 1 tablet (500 mg total) by mouth 4 (four) times daily. 56 tablet 0   mupirocin ointment (BACTROBAN) 2 % Apply topically 2 (two) times daily as needed.     omeprazole (PRILOSEC) 40 MG capsule as needed.     ondansetron (ZOFRAN-ODT) 4 MG disintegrating tablet '4mg'$  ODT q4 hours prn nausea/vomit 12 tablet 0   oxyCODONE-acetaminophen (PERCOCET) 5-325 MG tablet Take 1 tablet by mouth every 6 (six) hours as needed. 20 tablet 0   tiZANidine (ZANAFLEX) 4 MG tablet TAKE 1 TABLET(4 MG) BY MOUTH EVERY 6 HOURS AS NEEDED FOR MUSCLE SPASMS 30 tablet 6   No current facility-administered medications for this visit.     Review of Systems  Please see the history of present illness.    (+) Palpitations  All other systems reviewed and are otherwise negative except as noted above.  Physical Exam    Wt Readings from Last 3 Encounters:  01/06/22 243 lb 12.8 oz (110.6 kg)  07/25/21 241 lb (109.3 kg)  11/29/20 243 lb 11.2 oz (110.5 kg)   VS: Vitals:   01/06/22 0941  BP: 132/84  Pulse: 88  SpO2: 97%  ,Body mass index is 40.57 kg/m.  Constitutional:      Appearance: Healthy appearance. Not in distress.  Neck:     Vascular: JVD normal.  Pulmonary:     Effort: Pulmonary effort is normal.     Breath sounds: No wheezing. No rales. Diminished in the bases Cardiovascular:     Normal rate. Regular rhythm. Normal S1. Normal S2.      Murmurs: There is no murmur.  Edema:    Peripheral edema  absent.  Abdominal:     Palpations: Abdomen is soft non tender. There is no hepatomegaly.  Skin:    General: Skin is warm and dry.  Neurological:     General: No focal deficit present.     Mental Status: Alert and oriented to person, place and time.     Cranial Nerves: Cranial nerves are intact.  EKG/LABS/Other Studies Reviewed    ECG personally reviewed by me today -sinus rhythm with rate of 88 no abnormal changes.     Lab Results  Component Value Date   WBC 8.5 07/25/2021   HGB 11.7 (L) 07/25/2021   HCT 36.2 07/25/2021   MCV 89.6 07/25/2021   PLT 270 07/25/2021   Lab Results  Component Value Date   CREATININE 0.78 07/25/2021   BUN 11 07/25/2021   NA 137 07/25/2021   K 3.4 (L) 07/25/2021   CL 102 07/25/2021   CO2 25 07/25/2021   Lab Results  Component Value Date   ALT 21 07/25/2021   AST 21 07/25/2021   ALKPHOS 49 07/25/2021   BILITOT 1.4 (H) 07/25/2021   No results found for: "CHOL", "HDL", "LDLCALC", "LDLDIRECT", "TRIG", "CHOLHDL"  Lab Results  Component Value Date   HGBA1C 6.0 (H) 02/15/2020    Assessment & Plan    1.  Chronic combined systolic and diastolic CHF: - She is euvolemic on examination today and denies any chest pain or shortness of breath.  She is abstaining from sodium and does report some discretions in her diet.  She was provided information on the DASH diet and was encouraged to weigh herself daily. -She is requested that her Lasix be reduced due to excess urination.  I advised that we can trial reduction to 20 mg daily.  She will weigh yourself daily and report any increased weight gain. Low sodium  diet, fluid restriction <2L, and daily weights encouraged. Educated to contact our office for weight gain of 2 lbs overnight or 5 lbs in one week.  -Continue carvedilol 25 mg daily and Lotrel 5-40 mg daily -Stop Lasix 40 mg and start Lasix 20 mg today  2.  Morbid obesity: -Current BMI is 40.57 kg/m -I discussed at length today the importance of  weight loss with managing heart failure and hypertension.  She is currently walking with her mother 30 minutes a day once a week.  I encouraged her to increase this activity to 150 minutes or 5 times a week. -She was also encouraged to follow the DASH diet as mentioned above and reduce her calorie intake.  3.  Essential hypertension: -Blood pressure today was well controlled at 132/84.  She is currently not checking pressures at home due to having a cuff that does not fit.  I instructed her to go to [place] supply to obtain a new blood pressure cuff. -She was encouraged to check her blood pressures once daily and report any pressures greater than 140/90 -Continue current blood pressure regimen with Lotrel 5-40 mg daily and carvedilol 25 mg twice daily  4.  Obstructive sleep apnea: -She reports compliance with her CPAP nightly  5.  Palpitations: -She reports an increase in palpitations with and without activity.  She endorses some shortness of breath with activities at times. -We will order 14-day ZIO monitor to evaluate palpitations and to rule out possible arrhythmia  Disposition: Follow-up with Belva Crome III, MD or APP in 12 months   Medication Adjustments/Labs and Tests Ordered: Current medicines are reviewed at length with the patient today.  Concerns regarding medicines are outlined above.   Signed, Mable Fill, Marissa Nestle, NP 01/06/2022, 10:12 AM [place]

## 2022-01-06 ENCOUNTER — Ambulatory Visit: Payer: Managed Care, Other (non HMO)

## 2022-01-06 ENCOUNTER — Ambulatory Visit: Payer: Managed Care, Other (non HMO) | Admitting: Nurse Practitioner

## 2022-01-06 ENCOUNTER — Encounter: Payer: Self-pay | Admitting: Nurse Practitioner

## 2022-01-06 VITALS — BP 132/84 | HR 88 | Ht 65.0 in | Wt 243.8 lb

## 2022-01-06 DIAGNOSIS — Z9989 Dependence on other enabling machines and devices: Secondary | ICD-10-CM

## 2022-01-06 DIAGNOSIS — R002 Palpitations: Secondary | ICD-10-CM

## 2022-01-06 DIAGNOSIS — G4733 Obstructive sleep apnea (adult) (pediatric): Secondary | ICD-10-CM

## 2022-01-06 DIAGNOSIS — E785 Hyperlipidemia, unspecified: Secondary | ICD-10-CM

## 2022-01-06 DIAGNOSIS — I10 Essential (primary) hypertension: Secondary | ICD-10-CM | POA: Diagnosis not present

## 2022-01-06 DIAGNOSIS — I5042 Chronic combined systolic (congestive) and diastolic (congestive) heart failure: Secondary | ICD-10-CM

## 2022-01-06 MED ORDER — FUROSEMIDE 20 MG PO TABS: 20.0000 mg | ORAL_TABLET | Freq: Every day | ORAL | 3 refills | Status: DC

## 2022-01-06 NOTE — Patient Instructions (Signed)
Medication Instructions:  Your physician has recommended you make the following change in your medication:   REDUCE Lasix to 20 mg taking 1 daily.  *If you need a refill on your cardiac medications before your next appointment, please call your pharmacy*   Lab Work: None ordered  If you have labs (blood work) drawn today and your tests are completely normal, you will receive your results only by: [place] (if you have MyChart) OR A paper copy in the mail If you have any lab test that is abnormal or we need to change your treatment, we will call you to review the results.   Testing/Procedures: [name]- Long Term Monitor Instructions  Your physician has requested you wear a ZIO patch monitor for 14 days.  This is a single patch monitor. [employer] supplies one patch monitor per enrollment. Additional stickers are not available. Please do not apply patch if you will be having a Nuclear Stress Test,  Echocardiogram, Cardiac CT, MRI, or Chest Xray during the period you would be wearing the  monitor. The patch cannot be worn during these tests. You cannot remove and re-apply the  ZIO XT patch monitor.  Your ZIO patch monitor will be mailed 3 day USPS to your address on file. It may take 3-5 days  to receive your monitor after you have been enrolled.  Once you have received your monitor, please review the enclosed instructions. Your monitor  has already been registered assigning a specific monitor serial # to you.  Billing and Patient Assistance Program Information  We have supplied [employer] with any of your insurance information on file for billing purposes. [employer] offers a sliding scale Patient Assistance Program for patients that do not have  insurance, or whose insurance does not completely cover the cost of the ZIO monitor.  You must apply for the Patient Assistance Program to qualify for this discounted rate.  To apply, please call [employer] at [phone], select option 4,  select option 2, ask to apply for  Patient Assistance Program. Renee Roberson will ask your household income, and how many people  are in your household. They will quote your out-of-pocket cost based on that information.  [employer] will also be able to set up a 75-month interest-free payment plan if needed.  Applying the monitor   Shave hair from upper left chest.  Hold abrader disc by orange tab. Rub abrader in 40 strokes over the upper left chest as  indicated in your monitor instructions.  Clean area with 4 enclosed alcohol pads. Let dry.  Apply patch as indicated in monitor instructions. Patch will be placed under collarbone on left  side of chest with arrow pointing upward.  Rub patch adhesive wings for 2 minutes. Remove white label marked "1". Remove the white  label marked "2". Rub patch adhesive wings for 2 additional minutes.  While looking in a mirror, press and release button in center of patch. A small green light will  flash 3-4 times. This will be your only indicator that the monitor has been turned on.  Do not shower for the first 24 hours. You may shower after the first 24 hours.  Press the button if you feel a symptom. You will hear a small click. Record Date, Time and  Symptom in the Patient Logbook.  When you are ready to remove the patch, follow instructions on the last 2 pages of Patient  Logbook. Stick patch monitor onto the last page of Patient Logbook.  Place Patient Logbook  in the blue and white box. Use locking tab on box and tape box closed  securely. The blue and white box has prepaid postage on it. Please place it in the mailbox as  soon as possible. Your physician should have your test results approximately 7 days after the  monitor has been mailed back to [facility].  Call [place] at [phone] if you have questions regarding  your ZIO XT patch monitor. Call them immediately if you see an orange light blinking on your  monitor.  If your  monitor falls off in less than 4 days, contact our Monitor department at [phone].  If your monitor becomes loose or falls off after 4 days call [employer] at [phone] for  suggestions on securing your monitor    Follow-Up: At [facility], you and your health needs are our priority.  As part of our continuing mission to provide you with exceptional heart care, we have created designated Provider Care Teams.  These Care Teams include your primary Cardiologist (physician) and Advanced Practice Providers (APPs -  Physician Assistants and Nurse Practitioners) who all work together to provide you with the care you need, when you need it.  We recommend signing up for the patient portal called "MyChart".  Sign up information is provided on this After Visit Summary.  MyChart is used to connect with patients for Virtual Visits (Telemedicine).  Patients are able to view lab/test results, encounter notes, upcoming appointments, etc.  Non-urgent messages can be sent to your provider as well.   To learn more about what you can do with MyChart, go to [email or web address].    Your next appointment:   12 month(s)  The format for your next appointment:   In Person  Provider:   [name], MD     Other Instructions Monitor your blood pressure.  Let us know if it's consistently running over 140/90  Monitor your weight daily.  Call us if you gain 3 lbs in 24 hours or 5 lbs in a week.  (We are monitoring this since we are reducing your Lasix)  DASH Eating Plan DASH stands for Dietary Approaches to Stop Hypertension. The DASH eating plan is a healthy eating plan that has been shown to: Reduce high blood pressure (hypertension). Reduce your risk for type 2 diabetes, heart disease, and stroke. Help with weight loss. What are tips for following this plan? Reading food labels Check food labels for the amount of salt (sodium) per serving. Choose foods with less than 5 percent of the Daily  Value of sodium. Generally, foods with less than 300 milligrams (mg) of sodium per serving fit into this eating plan. To find whole grains, look for the word "whole" as the first word in the ingredient list. Shopping Buy products labeled as "low-sodium" or "no salt added." Buy fresh foods. Avoid canned foods and pre-made or frozen meals. Cooking Avoid adding salt when cooking. Use salt-free seasonings or herbs instead of table salt or sea salt. Check with your health care provider or pharmacist before using salt substitutes. Do not fry foods. Cook foods using healthy methods such as baking, boiling, grilling, roasting, and broiling instead. Cook with heart-healthy oils, such as olive, canola, avocado, soybean, or sunflower oil. Meal planning  Eat a balanced diet that includes: 4 or more servings of fruits and 4 or more servings of vegetables each day. Try to fill one-half of your plate with fruits and vegetables. 6-8 servings of whole grains each day.  Less than 6 oz (170 g) of lean meat, poultry, or fish each day. A 3-oz (85-g) serving of meat is about the same size as a deck of cards. One egg equals 1 oz (28 g). 2-3 servings of low-fat dairy each day. One serving is 1 cup (237 mL). 1 serving of nuts, seeds, or beans 5 times each week. 2-3 servings of heart-healthy fats. Healthy fats called omega-3 fatty acids are found in foods such as walnuts, flaxseeds, fortified milks, and eggs. These fats are also found in cold-water fish, such as sardines, salmon, and mackerel. Limit how much you eat of: Canned or prepackaged foods. Food that is high in trans fat, such as some fried foods. Food that is high in saturated fat, such as fatty meat. Desserts and other sweets, sugary drinks, and other foods with added sugar. Full-fat dairy products. Do not salt foods before eating. Do not eat more than 4 egg yolks a week. Try to eat at least 2 vegetarian meals a week. Eat more home-cooked food and less  restaurant, buffet, and fast food. Lifestyle When eating at a restaurant, ask that your food be prepared with less salt or no salt, if possible. If you drink alcohol: Limit how much you use to: 0-1 drink a day for women who are not pregnant. 0-2 drinks a day for men. Be aware of how much alcohol is in your drink. In the [place] one drink equals one 12 oz bottle of beer (355 mL), one 5 oz glass of wine (148 mL), or one 1 oz glass of hard liquor (44 mL). General information Avoid eating more than 2,300 mg of salt a day. If you have hypertension, you may need to reduce your sodium intake to 1,500 mg a day. Work with your health care provider to maintain a healthy body weight or to lose weight. Ask what an ideal weight is for you. Get at least 30 minutes of exercise that causes your heart to beat faster (aerobic exercise) most days of the week. Activities may include walking, swimming, or biking. Work with your health care provider or dietitian to adjust your eating plan to your individual calorie needs. What foods should I eat? Fruits All fresh, dried, or frozen fruit. Canned fruit in natural juice (without added sugar). Vegetables Fresh or frozen vegetables (raw, steamed, roasted, or grilled). Low-sodium or reduced-sodium tomato and vegetable juice. Low-sodium or reduced-sodium tomato sauce and tomato paste. Low-sodium or reduced-sodium canned vegetables. Grains Whole-grain or whole-wheat bread. Whole-grain or whole-wheat pasta. Brown rice. [place]. Bulgur. Whole-grain and low-sodium cereals. Pita bread. Low-fat, low-sodium crackers. Whole-wheat flour tortillas. Meats and other proteins Skinless chicken or [place]. Ground chicken or [place]. Pork with fat trimmed off. Fish and seafood. Egg whites. Dried beans, peas, or lentils. Unsalted nuts, nut butters, and seeds. Unsalted canned beans. Lean cuts of beef with fat trimmed off. Low-sodium, lean precooked or cured meat, such as sausages or  meat loaves. Dairy Low-fat (1%) or fat-free (skim) milk. Reduced-fat, low-fat, or fat-free cheeses. Nonfat, low-sodium ricotta or cottage cheese. Low-fat or nonfat yogurt. Low-fat, low-sodium cheese. Fats and oils Soft margarine without trans fats. Vegetable oil. Reduced-fat, low-fat, or light mayonnaise and salad dressings (reduced-sodium). Canola, safflower, olive, avocado, soybean, and sunflower oils. Avocado. Seasonings and condiments Herbs. Spices. Seasoning mixes without salt. Other foods Unsalted popcorn and pretzels. Fat-free sweets. The items listed above may not be a complete list of foods and beverages you can eat. Contact a dietitian for more information. What foods  should I avoid? Fruits Canned fruit in a light or heavy syrup. Fried fruit. Fruit in cream or butter sauce. Vegetables Creamed or fried vegetables. Vegetables in a cheese sauce. Regular canned vegetables (not low-sodium or reduced-sodium). Regular canned tomato sauce and paste (not low-sodium or reduced-sodium). Regular tomato and vegetable juice (not low-sodium or reduced-sodium). Angie Fava. Olives. Grains Baked goods made with fat, such as croissants, muffins, or some breads. Dry pasta or rice meal packs. Meats and other proteins Fatty cuts of meat. Ribs. Fried meat. Berniece Salines. Bologna, salami, and other precooked or cured meats, such as sausages or meat loaves. Fat from the back of a pig (fatback). Bratwurst. Salted nuts and seeds. Canned beans with added salt. Canned or smoked fish. Whole eggs or egg yolks. Chicken or [place] with skin. Dairy Whole or 2% milk, cream, and half-and-half. Whole or full-fat cream cheese. Whole-fat or sweetened yogurt. Full-fat cheese. Nondairy creamers. Whipped toppings. Processed cheese and cheese spreads. Fats and oils Butter. Stick margarine. Lard. Shortening. Ghee. Bacon fat. Tropical oils, such as coconut, palm kernel, or palm oil. Seasonings and condiments Onion salt, garlic salt,  seasoned salt, table salt, and sea salt. Worcestershire sauce. Tartar sauce. Barbecue sauce. Teriyaki sauce. Soy sauce, including reduced-sodium. Steak sauce. Canned and packaged gravies. Fish sauce. Oyster sauce. Cocktail sauce. Store-bought horseradish. Ketchup. Mustard. Meat flavorings and tenderizers. Bouillon cubes. Hot sauces. Pre-made or packaged marinades. Pre-made or packaged taco seasonings. Relishes. Regular salad dressings. Other foods Salted popcorn and pretzels. The items listed above may not be a complete list of foods and beverages you should avoid. Contact a dietitian for more information. Where to find more information National Heart, Lung, and Blood Institute: https://wilson-eaton.com/ American Heart Association: www.heart.org Academy of Nutrition and Dietetics: www.eatright.[place]: www.kidney.org Summary The DASH eating plan is a healthy eating plan that has been shown to reduce high blood pressure (hypertension). It may also reduce your risk for type 2 diabetes, heart disease, and stroke. When on the DASH eating plan, aim to eat more fresh fruits and vegetables, whole grains, lean proteins, low-fat dairy, and heart-healthy fats. With the DASH eating plan, you should limit salt (sodium) intake to 2,300 mg a day. If you have hypertension, you may need to reduce your sodium intake to 1,500 mg a day. Work with your health care provider or dietitian to adjust your eating plan to your individual calorie needs. This information is not intended to replace advice given to you by your health care provider. Make sure you discuss any questions you have with your health care provider. Document Revised: 04/21/2019 Document Reviewed: 04/21/2019 Elsevier Patient Education  [place]

## 2022-01-06 NOTE — Progress Notes (Unsigned)
ZIO XT mailed to pt's home address. 

## 2022-01-16 ENCOUNTER — Other Ambulatory Visit: Payer: Self-pay

## 2022-01-16 MED ORDER — CARVEDILOL 25 MG PO TABS: ORAL_TABLET | ORAL | 3 refills | Status: DC
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
## 2022-01-29 ENCOUNTER — Ambulatory Visit: Payer: Managed Care, Other (non HMO) | Admitting: Internal Medicine

## 2022-01-29 DIAGNOSIS — M7532 Calcific tendinitis of left shoulder: Secondary | ICD-10-CM

## 2022-01-29 DIAGNOSIS — M1A0710 Idiopathic chronic gout, right ankle and foot, without tophus (tophi): Secondary | ICD-10-CM

## 2022-03-17 ENCOUNTER — Other Ambulatory Visit: Payer: Self-pay | Admitting: Internal Medicine

## 2022-03-17 DIAGNOSIS — M1A0710 Idiopathic chronic gout, right ankle and foot, without tophus (tophi): Secondary | ICD-10-CM

## 2022-03-17 NOTE — Telephone Encounter (Signed)
Next Visit: Due around 11/29/2021. Message sent to the front to schedule.  Last Visit: 11/29/2020  Last Fill: 12/19/2021  DX: Idiopathic chronic gout of right foot without tophus   Current Dose per office note 11/29/2020: If level is 4 or less would recommend decreasing the allopurinol to just 300 mg  Labs: 07/25/2021 CBC Hemoglobin 11.7 07/25/2021 Potassium 3.4, Glucose 117, Total bilirubin 1.4  12/05/2020 Uric acid is at a good level and no problems for kidney function tests so can continue on this dose without changes.  Called patient to inform her she is due for labs and follow-up appointment.  Okay to refill allopurinol?

## 2022-03-25 ENCOUNTER — Other Ambulatory Visit: Payer: Self-pay | Admitting: Interventional Cardiology
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
## 2022-03-27 ENCOUNTER — Other Ambulatory Visit: Payer: Self-pay | Admitting: *Deleted

## 2022-03-27 MED ORDER — AMLODIPINE BESY-BENAZEPRIL HCL 5-40 MG PO CAPS: 1.0000 | ORAL_CAPSULE | Freq: Every day | ORAL | 3 refills | Status: AC

## 2022-04-03 ENCOUNTER — Ambulatory Visit: Payer: Managed Care, Other (non HMO) | Admitting: Internal Medicine

## 2022-04-03 DIAGNOSIS — M1A0710 Idiopathic chronic gout, right ankle and foot, without tophus (tophi): Secondary | ICD-10-CM

## 2022-04-03 DIAGNOSIS — M7532 Calcific tendinitis of left shoulder: Secondary | ICD-10-CM

## 2022-06-15 ENCOUNTER — Other Ambulatory Visit: Payer: Self-pay | Admitting: Internal Medicine

## 2022-06-15 DIAGNOSIS — M1A0710 Idiopathic chronic gout, right ankle and foot, without tophus (tophi): Secondary | ICD-10-CM

## 2022-12-07 ENCOUNTER — Other Ambulatory Visit: Payer: Self-pay | Admitting: Internal Medicine

## 2022-12-07 DIAGNOSIS — M1A0710 Idiopathic chronic gout, right ankle and foot, without tophus (tophi): Secondary | ICD-10-CM

## 2023-01-12 ENCOUNTER — Other Ambulatory Visit: Payer: Self-pay

## 2023-01-12 MED ORDER — CARVEDILOL 25 MG PO TABS: ORAL_TABLET | ORAL | 0 refills | Status: AC

## 2023-01-16 ENCOUNTER — Other Ambulatory Visit: Payer: Self-pay | Admitting: Nurse Practitioner

## 2023-01-18 ENCOUNTER — Other Ambulatory Visit: Payer: Self-pay | Admitting: Nurse Practitioner

## 2023-04-27 ENCOUNTER — Other Ambulatory Visit: Payer: Self-pay | Admitting: Nurse Practitioner

## 2023-05-09 ENCOUNTER — Other Ambulatory Visit: Payer: Self-pay | Admitting: Nurse Practitioner

## 2023-05-13 ENCOUNTER — Other Ambulatory Visit: Payer: Self-pay

## 2023-05-13 MED ORDER — FUROSEMIDE 20 MG PO TABS: 20.0000 mg | ORAL_TABLET | Freq: Every day | ORAL | 0 refills | Status: AC

## 2023-05-27 ENCOUNTER — Other Ambulatory Visit: Payer: Self-pay | Admitting: Nurse Practitioner
